# Patient Record
Sex: Female | Born: 1960
Health system: Southern US, Community
[De-identification: ages and names within clinical notes are randomized; demographics above are authoritative.]

## PROBLEM LIST (undated history)

## (undated) DIAGNOSIS — F329 Major depressive disorder, single episode, unspecified: Secondary | ICD-10-CM

## (undated) DIAGNOSIS — E042 Nontoxic multinodular goiter: Secondary | ICD-10-CM

## (undated) DIAGNOSIS — I10 Essential (primary) hypertension: Secondary | ICD-10-CM

## (undated) DIAGNOSIS — D509 Iron deficiency anemia, unspecified: Secondary | ICD-10-CM

## (undated) DIAGNOSIS — M1990 Unspecified osteoarthritis, unspecified site: Secondary | ICD-10-CM

## (undated) DIAGNOSIS — IMO0002 Reserved for concepts with insufficient information to code with codable children: Secondary | ICD-10-CM

## (undated) DIAGNOSIS — F411 Generalized anxiety disorder: Secondary | ICD-10-CM

## (undated) DIAGNOSIS — E119 Type 2 diabetes mellitus without complications: Secondary | ICD-10-CM

## (undated) DIAGNOSIS — R609 Edema, unspecified: Secondary | ICD-10-CM

## (undated) DIAGNOSIS — K219 Gastro-esophageal reflux disease without esophagitis: Secondary | ICD-10-CM

## (undated) HISTORY — DX: Morbid (severe) obesity due to excess calories: E66.01

## (undated) HISTORY — DX: Generalized anxiety disorder: F41.1

## (undated) HISTORY — DX: Gastro-esophageal reflux disease without esophagitis: K21.9

## (undated) HISTORY — DX: Nontoxic multinodular goiter: E04.2

## (undated) HISTORY — DX: Essential (primary) hypertension: I10

## (undated) HISTORY — DX: Type 2 diabetes mellitus without complications: E11.9

## (undated) HISTORY — DX: Edema, unspecified: R60.9

## (undated) HISTORY — DX: Unspecified osteoarthritis, unspecified site: M19.90

## (undated) HISTORY — DX: Reserved for concepts with insufficient information to code with codable children: IMO0002

## (undated) HISTORY — DX: Major depressive disorder, single episode, unspecified: F32.9

## (undated) HISTORY — DX: Iron deficiency anemia, unspecified: D50.9

---

## 1987-02-27 HISTORY — PX: INCISION AND DRAINAGE PERIRECTAL ABSCESS: SHX1804

## 1990-02-26 HISTORY — PX: CHOLECYSTECTOMY: SHX55

## 1991-02-27 HISTORY — PX: TREATMENT FISTULA ANAL: SUR1390

## 1997-06-15 ENCOUNTER — Encounter: Admission: RE | Admit: 1997-06-15 | Discharge: 1997-09-13 | Payer: Self-pay | Admitting: Internal Medicine

## 2000-09-10 ENCOUNTER — Encounter: Payer: Self-pay | Admitting: Internal Medicine

## 2000-09-10 ENCOUNTER — Encounter: Admission: RE | Admit: 2000-09-10 | Discharge: 2000-09-10 | Payer: Self-pay | Admitting: Internal Medicine

## 2000-09-10 ENCOUNTER — Encounter (INDEPENDENT_AMBULATORY_CARE_PROVIDER_SITE_OTHER): Payer: Self-pay | Admitting: *Deleted

## 2000-09-10 ENCOUNTER — Inpatient Hospital Stay (HOSPITAL_COMMUNITY): Admission: EM | Admit: 2000-09-10 | Discharge: 2000-09-13 | Payer: Self-pay | Admitting: Internal Medicine

## 2000-09-12 ENCOUNTER — Encounter: Payer: Self-pay | Admitting: Surgery

## 2002-01-01 ENCOUNTER — Encounter: Payer: Self-pay | Admitting: Surgery

## 2002-01-02 ENCOUNTER — Ambulatory Visit (HOSPITAL_COMMUNITY): Admission: RE | Admit: 2002-01-02 | Discharge: 2002-01-02 | Payer: Self-pay | Admitting: Surgery

## 2004-11-06 ENCOUNTER — Ambulatory Visit: Payer: Self-pay | Admitting: Endocrinology

## 2004-11-27 ENCOUNTER — Ambulatory Visit: Payer: Self-pay | Admitting: Internal Medicine

## 2004-12-04 ENCOUNTER — Ambulatory Visit: Payer: Self-pay | Admitting: Endocrinology

## 2004-12-18 ENCOUNTER — Ambulatory Visit: Payer: Self-pay | Admitting: Endocrinology

## 2005-01-22 ENCOUNTER — Ambulatory Visit: Payer: Self-pay | Admitting: Endocrinology

## 2005-01-31 LAB — HM COLONOSCOPY

## 2005-02-28 ENCOUNTER — Ambulatory Visit: Payer: Self-pay | Admitting: Endocrinology

## 2005-03-05 ENCOUNTER — Ambulatory Visit: Payer: Self-pay | Admitting: Endocrinology

## 2005-05-24 ENCOUNTER — Ambulatory Visit: Payer: Self-pay | Admitting: Endocrinology

## 2005-06-12 ENCOUNTER — Encounter: Payer: Self-pay | Admitting: Cardiology

## 2005-06-12 ENCOUNTER — Ambulatory Visit: Payer: Self-pay

## 2005-06-20 ENCOUNTER — Ambulatory Visit: Payer: Self-pay | Admitting: Endocrinology

## 2005-08-22 ENCOUNTER — Ambulatory Visit: Payer: Self-pay | Admitting: Endocrinology

## 2005-08-22 HISTORY — PX: ELECTROCARDIOGRAM: SHX264

## 2005-10-09 ENCOUNTER — Ambulatory Visit: Payer: Self-pay | Admitting: Endocrinology

## 2005-11-22 ENCOUNTER — Ambulatory Visit: Payer: Self-pay | Admitting: Endocrinology

## 2006-02-22 ENCOUNTER — Ambulatory Visit: Payer: Self-pay | Admitting: Endocrinology

## 2006-02-26 HISTORY — PX: GASTRIC BYPASS OPEN: SUR638

## 2006-05-27 ENCOUNTER — Ambulatory Visit: Payer: Self-pay | Admitting: Endocrinology

## 2006-05-27 LAB — CONVERTED CEMR LAB
Basophils Absolute: 0 10*3/uL (ref 0.0–0.1)
Basophils Relative: 0.3 % (ref 0.0–1.0)
HCT: 33.8 % — ABNORMAL LOW (ref 36.0–46.0)
Hgb A1c MFr Bld: 7.3 % — ABNORMAL HIGH (ref 4.6–6.0)
MCV: 83.6 fL (ref 78.0–100.0)
Monocytes Absolute: 0.4 10*3/uL (ref 0.2–0.7)
Monocytes Relative: 5.1 % (ref 3.0–11.0)
Neutrophils Relative %: 62.9 % (ref 43.0–77.0)
RDW: 16.2 % — ABNORMAL HIGH (ref 11.5–14.6)
WBC: 8.3 10*3/uL (ref 4.5–10.5)

## 2006-06-23 ENCOUNTER — Emergency Department (HOSPITAL_COMMUNITY): Admission: EM | Admit: 2006-06-23 | Discharge: 2006-06-23 | Payer: Self-pay | Admitting: Emergency Medicine

## 2006-06-24 ENCOUNTER — Emergency Department (HOSPITAL_COMMUNITY): Admission: EM | Admit: 2006-06-24 | Discharge: 2006-06-24 | Payer: Self-pay | Admitting: Emergency Medicine

## 2006-08-05 ENCOUNTER — Ambulatory Visit: Payer: Self-pay | Admitting: Endocrinology

## 2006-08-05 LAB — CONVERTED CEMR LAB
Alkaline Phosphatase: 90 units/L (ref 39–117)
Basophils Absolute: 0.1 10*3/uL (ref 0.0–0.1)
Bilirubin, Direct: 0.1 mg/dL (ref 0.0–0.3)
CO2: 27 meq/L (ref 19–32)
Calcium, Total (PTH): 9 mg/dL (ref 8.4–10.5)
Chloride: 109 meq/L (ref 96–112)
Creatinine, Ser: 0.8 mg/dL (ref 0.4–1.2)
Eosinophils Absolute: 0 10*3/uL (ref 0.0–0.6)
Eosinophils Relative: 0.6 % (ref 0.0–5.0)
Ferritin: 182.3 ng/mL (ref 10.0–291.0)
Folate: >20 ng/mL
GFR calc non Af Amer: 83 mL/min
Glucose, Bld: 215 mg/dL — ABNORMAL HIGH (ref 70–99)
HCT: 31.4 % — ABNORMAL LOW (ref 36.0–46.0)
Hemoglobin: 10.4 g/dL — ABNORMAL LOW (ref 12.0–15.0)
Hgb A1c MFr Bld: 6.9 % — ABNORMAL HIGH (ref 4.6–6.0)
MCHC: 33.2 g/dL (ref 30.0–36.0)
MCV: 84.2 fL (ref 78.0–100.0)
Microalb Creat Ratio: 1.2 mg/g (ref 0.0–30.0)
Monocytes Absolute: 0.3 10*3/uL (ref 0.2–0.7)
Neutro Abs: 3.9 10*3/uL (ref 1.4–7.7)
PTH: 22.1 pg/mL (ref 14.0–72.0)
Potassium: 3.5 meq/L (ref 3.5–5.1)
RBC: 3.73 M/uL — ABNORMAL LOW (ref 3.87–5.11)
Sodium: 141 meq/L (ref 135–145)
Total Bilirubin: 0.5 mg/dL (ref 0.3–1.2)
Total Protein: 7.1 g/dL (ref 6.0–8.3)
WBC: 6.7 10*3/uL (ref 4.5–10.5)

## 2006-09-21 ENCOUNTER — Encounter: Payer: Self-pay | Admitting: Endocrinology

## 2006-09-21 DIAGNOSIS — I10 Essential (primary) hypertension: Secondary | ICD-10-CM | POA: Insufficient documentation

## 2006-09-21 HISTORY — DX: Essential (primary) hypertension: I10

## 2006-12-04 ENCOUNTER — Ambulatory Visit: Payer: Self-pay | Admitting: Endocrinology

## 2006-12-04 ENCOUNTER — Encounter: Payer: Self-pay | Admitting: Endocrinology

## 2006-12-13 ENCOUNTER — Encounter: Admission: RE | Admit: 2006-12-13 | Discharge: 2006-12-13 | Payer: Self-pay | Admitting: Endocrinology

## 2006-12-25 ENCOUNTER — Other Ambulatory Visit: Admission: RE | Admit: 2006-12-25 | Discharge: 2006-12-25 | Payer: Self-pay | Admitting: Interventional Radiology

## 2006-12-25 ENCOUNTER — Encounter: Admission: RE | Admit: 2006-12-25 | Discharge: 2006-12-25 | Payer: Self-pay | Admitting: Endocrinology

## 2006-12-25 ENCOUNTER — Encounter (INDEPENDENT_AMBULATORY_CARE_PROVIDER_SITE_OTHER): Payer: Self-pay | Admitting: Interventional Radiology

## 2007-01-05 ENCOUNTER — Emergency Department (HOSPITAL_COMMUNITY): Admission: EM | Admit: 2007-01-05 | Discharge: 2007-01-05 | Payer: Self-pay | Admitting: Emergency Medicine

## 2007-01-05 ENCOUNTER — Telehealth: Payer: Self-pay | Admitting: Internal Medicine

## 2007-01-07 ENCOUNTER — Ambulatory Visit: Payer: Self-pay | Admitting: Endocrinology

## 2007-01-07 ENCOUNTER — Telehealth: Payer: Self-pay | Admitting: Internal Medicine

## 2007-01-07 ENCOUNTER — Telehealth: Payer: Self-pay | Admitting: Endocrinology

## 2007-01-07 DIAGNOSIS — IMO0002 Reserved for concepts with insufficient information to code with codable children: Secondary | ICD-10-CM

## 2007-01-07 DIAGNOSIS — E042 Nontoxic multinodular goiter: Secondary | ICD-10-CM

## 2007-01-07 HISTORY — DX: Reserved for concepts with insufficient information to code with codable children: IMO0002

## 2007-01-07 HISTORY — DX: Nontoxic multinodular goiter: E04.2

## 2007-01-13 ENCOUNTER — Telehealth (INDEPENDENT_AMBULATORY_CARE_PROVIDER_SITE_OTHER): Payer: Self-pay | Admitting: *Deleted

## 2007-01-13 DIAGNOSIS — IMO0002 Reserved for concepts with insufficient information to code with codable children: Secondary | ICD-10-CM

## 2007-01-13 HISTORY — DX: Reserved for concepts with insufficient information to code with codable children: IMO0002

## 2007-01-13 LAB — CONVERTED CEMR LAB: BUN: 14 mg/dL (ref 6–23)

## 2007-01-16 ENCOUNTER — Encounter: Admission: RE | Admit: 2007-01-16 | Discharge: 2007-01-16 | Payer: Self-pay | Admitting: Endocrinology

## 2007-01-27 ENCOUNTER — Telehealth (INDEPENDENT_AMBULATORY_CARE_PROVIDER_SITE_OTHER): Payer: Self-pay | Admitting: *Deleted

## 2007-02-17 ENCOUNTER — Ambulatory Visit: Payer: Self-pay | Admitting: Internal Medicine

## 2007-02-17 DIAGNOSIS — D509 Iron deficiency anemia, unspecified: Secondary | ICD-10-CM | POA: Insufficient documentation

## 2007-02-17 DIAGNOSIS — F411 Generalized anxiety disorder: Secondary | ICD-10-CM | POA: Insufficient documentation

## 2007-02-17 DIAGNOSIS — F329 Major depressive disorder, single episode, unspecified: Secondary | ICD-10-CM

## 2007-02-17 DIAGNOSIS — F3289 Other specified depressive episodes: Secondary | ICD-10-CM

## 2007-02-17 DIAGNOSIS — R066 Hiccough: Secondary | ICD-10-CM | POA: Insufficient documentation

## 2007-02-17 DIAGNOSIS — J069 Acute upper respiratory infection, unspecified: Secondary | ICD-10-CM | POA: Insufficient documentation

## 2007-02-17 HISTORY — DX: Major depressive disorder, single episode, unspecified: F32.9

## 2007-02-17 HISTORY — DX: Other specified depressive episodes: F32.89

## 2007-02-17 HISTORY — DX: Generalized anxiety disorder: F41.1

## 2007-02-17 HISTORY — DX: Iron deficiency anemia, unspecified: D50.9

## 2007-03-05 ENCOUNTER — Ambulatory Visit: Payer: Self-pay | Admitting: Endocrinology

## 2007-03-06 LAB — CONVERTED CEMR LAB
Basophils Relative: 0.5 % (ref 0.0–1.0)
Eosinophils Relative: 0.6 % (ref 0.0–5.0)
Hgb A1c MFr Bld: 7.2 % — ABNORMAL HIGH (ref 4.6–6.0)
Iron: 48 ug/dL (ref 42–145)
Lymphocytes Relative: 39.4 % (ref 12.0–46.0)
MCV: 85.5 fL (ref 78.0–100.0)
Monocytes Absolute: 0.5 10*3/uL (ref 0.2–0.7)
Neutrophils Relative %: 52.9 % (ref 43.0–77.0)
Platelets: 298 10*3/uL (ref 150–400)
RBC: 4.36 M/uL (ref 3.87–5.11)

## 2007-03-31 ENCOUNTER — Encounter: Payer: Self-pay | Admitting: Endocrinology

## 2007-04-15 ENCOUNTER — Ambulatory Visit: Payer: Self-pay | Admitting: Endocrinology

## 2007-05-07 ENCOUNTER — Ambulatory Visit: Payer: Self-pay | Admitting: Endocrinology

## 2007-06-16 ENCOUNTER — Ambulatory Visit: Payer: Self-pay | Admitting: Endocrinology

## 2007-07-17 ENCOUNTER — Encounter: Payer: Self-pay | Admitting: Endocrinology

## 2007-07-17 ENCOUNTER — Ambulatory Visit: Payer: Self-pay | Admitting: Endocrinology

## 2007-07-17 DIAGNOSIS — E119 Type 2 diabetes mellitus without complications: Secondary | ICD-10-CM

## 2007-07-17 HISTORY — DX: Type 2 diabetes mellitus without complications: E11.9

## 2007-09-11 ENCOUNTER — Encounter: Payer: Self-pay | Admitting: Endocrinology

## 2007-10-21 ENCOUNTER — Ambulatory Visit: Payer: Self-pay | Admitting: Endocrinology

## 2008-01-14 ENCOUNTER — Ambulatory Visit: Payer: Self-pay | Admitting: Endocrinology

## 2008-01-14 LAB — CONVERTED CEMR LAB: Hgb A1c MFr Bld: 7.2 % — ABNORMAL HIGH (ref 4.6–6.0)

## 2008-04-13 IMAGING — CT CT L SPINE W/O CM
3 of 8 series · 11 of 33 positions shown, 13 images · IV contrast (agent unspecified)
Comparison: None.

CLINICAL DATA: Left L5 radiculopathy.  Left leg pain.
 LUMBAR SPINE CT WITHOUT CONTRAST:
TECHNIQUE: Multidetector CT imaging of the lumbar spine was performed.  Multiplanar CT image reconstructions were also generated.

[Series 200: sagittal · sagittal · 0.43mm/px · 5 of 40 slices shown, 6 images]
[im 14/40  bone]
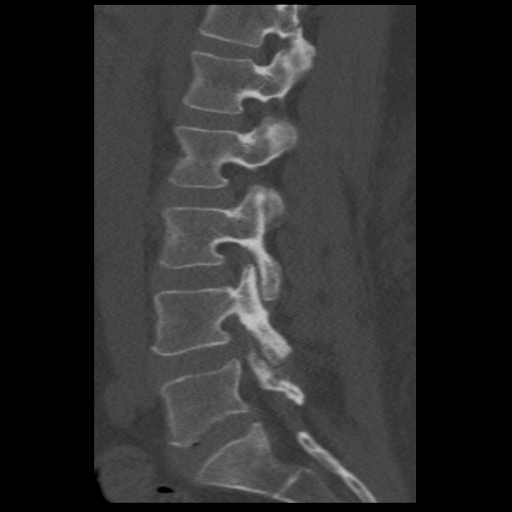
[im 17/40  bone]
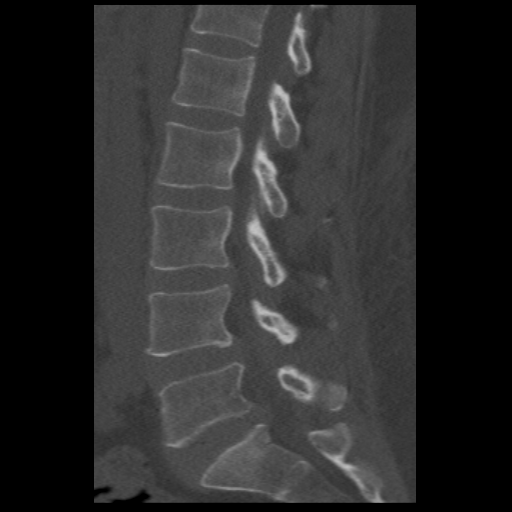
[im 20/40  soft-tissue]
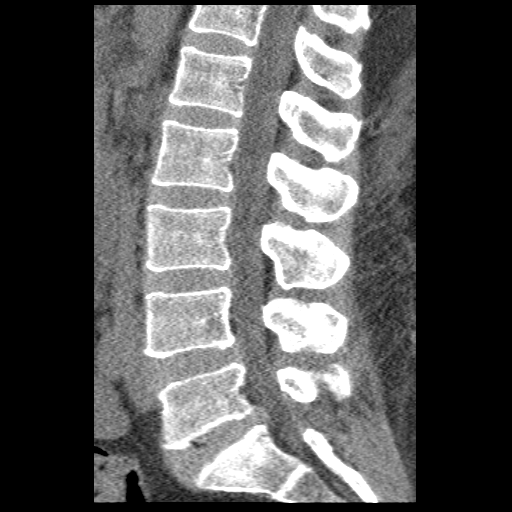
[im 20/40  bone]
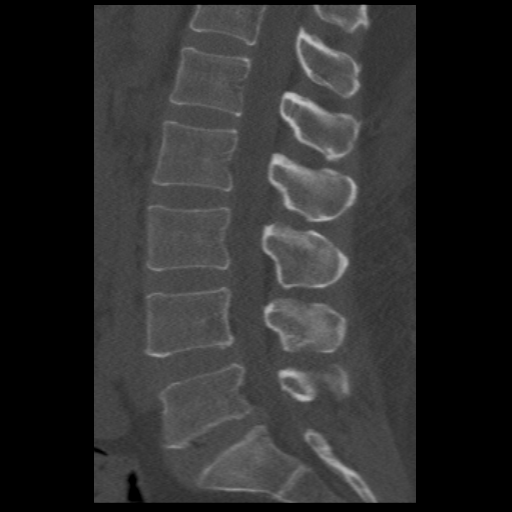
[im 23/40  bone]
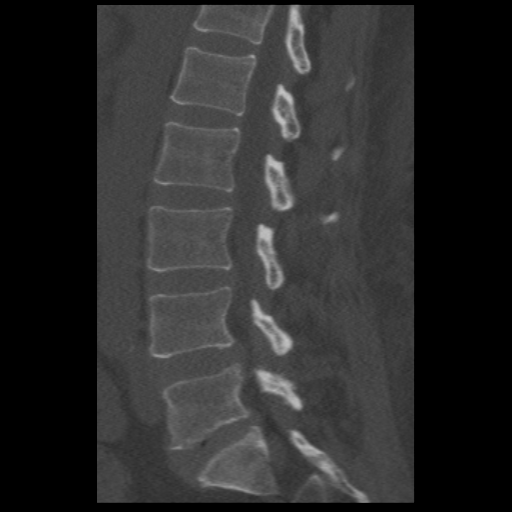
[im 27/40  bone]
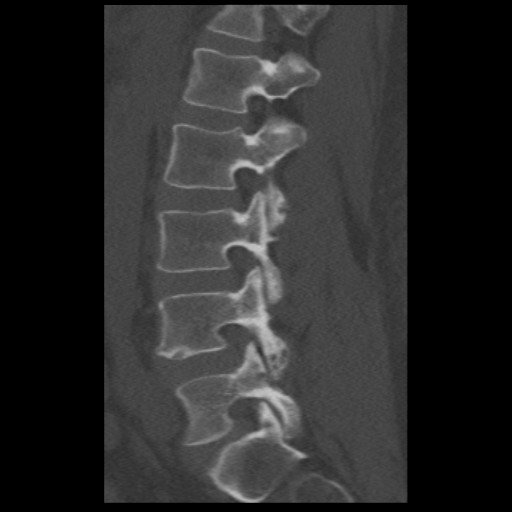

[Series 201: coronal · coronal · 0.43mm/px · 3 of 40 slices shown]
[im 8/40  bone]
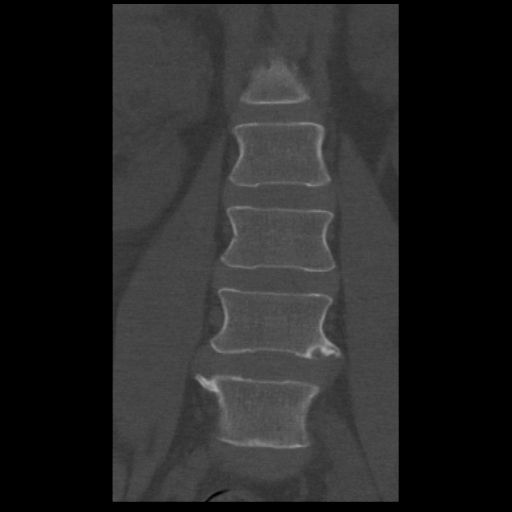
[im 16/40  bone]
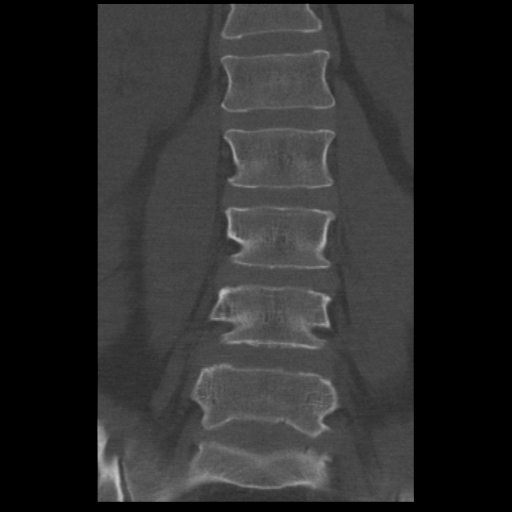
[im 24/40  bone]
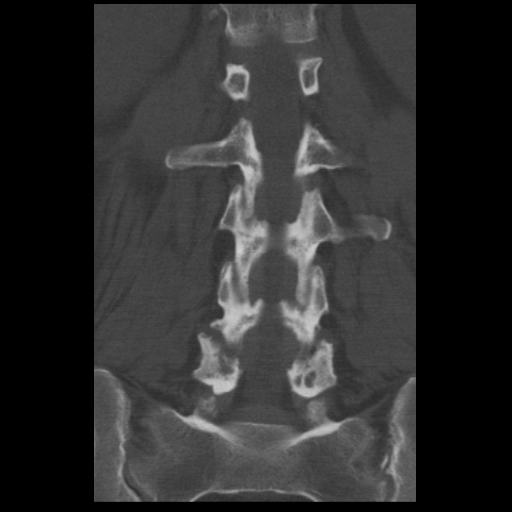

[Series 203: axial ref · axial · 0.23mm/px · z∈[+90,+151]mm · 3 of 12 slices shown, 4 images]
[im 1/12  soft-tissue]
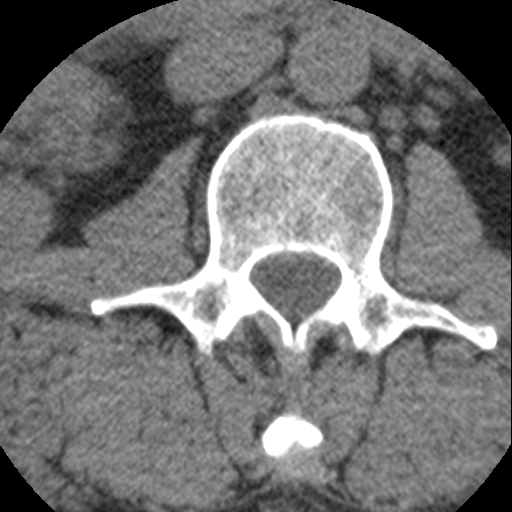
[im 1/12  bone]
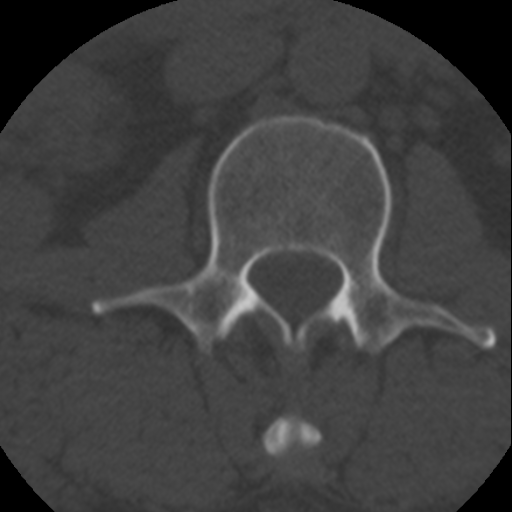
[im 6/12  bone]
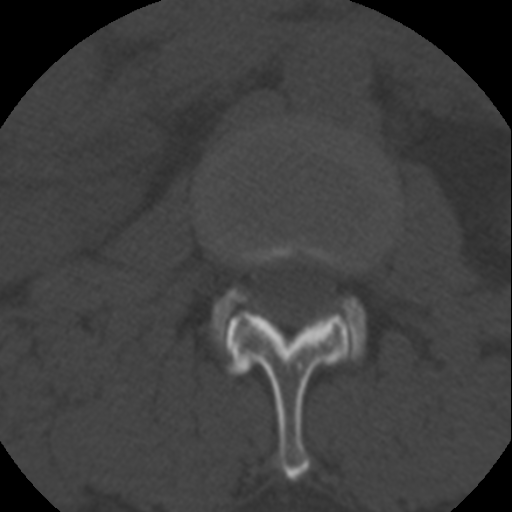
[im 12/12  bone]
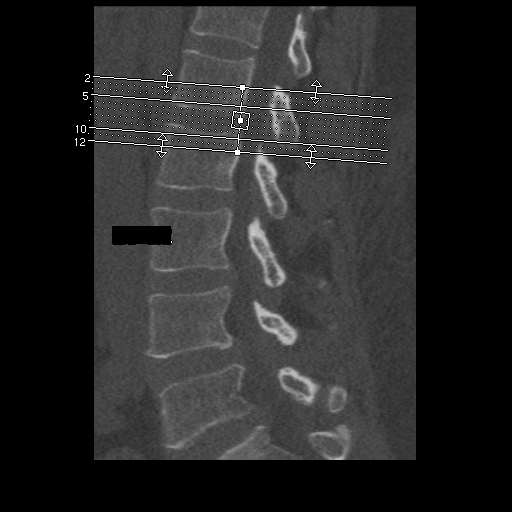

[11 of 33 positions shown; findings below may reference images not displayed]

FINDINGS: The lumbar alignment is normal.  No fracture is present.  No mass lesion is identified.
 L1-2:  Mild disk bulging and mild facet arthropathy. 
 L2-3:  Mild disk bulging.
 L3-4:  Mild disk bulging and mild facet arthropathy without significant spinal stenosis.
 L4-5:  Diffuse disk protrusion is present.  This is more prominent on the right than the left with disk protruding in the right foramen and lateral to the foramen causing impingement of the right L4 and L5 nerve roots.  There is also diffuse disk protrusion centrally and on the left side and there may be some milder impingement of the left L5 nerve root.  There is moderate to advanced facet arthropathy with ligamentum flavum hypertrophy.  There is moderate central canal stenosis.
 L5-S1:  Moderately large central disk protrusion slightly eccentric to the right, this is causing some mass effect on the thecal sac.  The S1 nerve roots appear to exit without impingement.  The L5 roots also appear to exit without significant impingement.
IMPRESSION: 1.  Degenerative disk disease at several levels, most prominent at L4-5.  There is diffuse disk protrusion at L4-5, more prominent on the right than the left.  There is bilateral facet arthropathy and moderate spinal stenosis.  There is impingement of the L5 nerve root bilaterally, right greater than left, and also some displacement of the right L4 nerve root lateral to the foramen due to disk protrusion. 
 2.  Moderately large central disk protrusion at L5-S1.

## 2008-04-21 ENCOUNTER — Ambulatory Visit: Payer: Self-pay | Admitting: Endocrinology

## 2008-04-21 LAB — CONVERTED CEMR LAB: Hgb A1c MFr Bld: 8 % — ABNORMAL HIGH (ref 4.6–6.0)

## 2008-04-26 ENCOUNTER — Telehealth (INDEPENDENT_AMBULATORY_CARE_PROVIDER_SITE_OTHER): Payer: Self-pay | Admitting: *Deleted

## 2008-07-21 ENCOUNTER — Ambulatory Visit: Payer: Self-pay | Admitting: Endocrinology

## 2008-07-21 DIAGNOSIS — R609 Edema, unspecified: Secondary | ICD-10-CM

## 2008-07-21 HISTORY — DX: Edema, unspecified: R60.9

## 2008-07-29 ENCOUNTER — Encounter: Payer: Self-pay | Admitting: Endocrinology

## 2008-08-26 LAB — CONVERTED CEMR LAB

## 2008-09-15 ENCOUNTER — Encounter: Payer: Self-pay | Admitting: Endocrinology

## 2008-09-20 ENCOUNTER — Encounter: Admission: RE | Admit: 2008-09-20 | Discharge: 2008-09-20 | Payer: Self-pay | Admitting: Family Medicine

## 2008-10-04 ENCOUNTER — Telehealth: Payer: Self-pay | Admitting: Endocrinology

## 2008-11-10 ENCOUNTER — Encounter: Payer: Self-pay | Admitting: Endocrinology

## 2008-11-10 ENCOUNTER — Other Ambulatory Visit: Admission: RE | Admit: 2008-11-10 | Discharge: 2008-11-10 | Payer: Self-pay | Admitting: Endocrinology

## 2008-11-10 ENCOUNTER — Ambulatory Visit: Payer: Self-pay | Admitting: Endocrinology

## 2008-11-11 LAB — CONVERTED CEMR LAB
Creatinine,U: 54.8 mg/dL
Microalb, Ur: 0.2 mg/dL (ref 0.0–1.9)

## 2008-11-26 ENCOUNTER — Telehealth: Payer: Self-pay | Admitting: Endocrinology

## 2009-02-09 ENCOUNTER — Ambulatory Visit: Payer: Self-pay | Admitting: Endocrinology

## 2009-02-09 LAB — CONVERTED CEMR LAB: Hgb A1c MFr Bld: 9.3 % — ABNORMAL HIGH (ref 4.6–6.5)

## 2009-03-11 ENCOUNTER — Encounter: Payer: Self-pay | Admitting: Endocrinology

## 2009-06-01 ENCOUNTER — Ambulatory Visit: Payer: Self-pay | Admitting: Endocrinology

## 2009-06-01 DIAGNOSIS — Z9884 Bariatric surgery status: Secondary | ICD-10-CM

## 2009-06-01 LAB — CONVERTED CEMR LAB
Calcium, Total (PTH): 9.3 mg/dL (ref 8.4–10.5)
Hgb A1c MFr Bld: 7.5 % — ABNORMAL HIGH (ref 4.6–6.5)
PTH: 28.9 pg/mL (ref 14.0–72.0)

## 2009-06-03 ENCOUNTER — Encounter: Payer: Self-pay | Admitting: Endocrinology

## 2009-08-05 ENCOUNTER — Encounter: Payer: Self-pay | Admitting: Endocrinology

## 2009-09-01 ENCOUNTER — Encounter: Admission: RE | Admit: 2009-09-01 | Discharge: 2009-09-01 | Payer: Self-pay | Admitting: Family Medicine

## 2009-09-05 ENCOUNTER — Ambulatory Visit: Payer: Self-pay | Admitting: Endocrinology

## 2009-09-05 LAB — CONVERTED CEMR LAB: TSH: 0.93 microintl units/mL (ref 0.35–5.50)

## 2009-09-16 ENCOUNTER — Encounter: Payer: Self-pay | Admitting: Endocrinology

## 2009-12-08 ENCOUNTER — Ambulatory Visit: Payer: Self-pay | Admitting: Endocrinology

## 2010-03-09 ENCOUNTER — Ambulatory Visit
Admission: RE | Admit: 2010-03-09 | Discharge: 2010-03-09 | Payer: Self-pay | Source: Home / Self Care | Attending: Endocrinology | Admitting: Endocrinology

## 2010-03-09 ENCOUNTER — Other Ambulatory Visit: Payer: Self-pay | Admitting: Endocrinology

## 2010-03-09 LAB — HEMOGLOBIN A1C: Hgb A1c MFr Bld: 8 % — ABNORMAL HIGH (ref 4.6–6.5)

## 2010-03-19 ENCOUNTER — Encounter: Payer: Self-pay | Admitting: Family Medicine

## 2010-03-19 ENCOUNTER — Encounter: Payer: Self-pay | Admitting: Endocrinology

## 2010-03-28 NOTE — Assessment & Plan Note (Signed)
Summary: 3 MTH FU--STC   Vital Signs:  Patient profile:   50 year old female Height:      67 inches (170.18 cm) Weight:      244 pounds (110.91 kg) BMI:     38.35 O2 Sat:      98 % on Room air Temp:     98.7 degrees F (37.06 degrees C) oral Pulse rate:   79 / minute BP sitting:   130 / 74  (left arm) Cuff size:   large  Vitals Entered By: Rebeca Alert MA (September 05, 2009 8:02 AM)  O2 Flow:  Room air CC: 3 mo f/u/aj   Primary Provider:  bouska  CC:  3 mo f/u/aj.  History of Present Illness: the status of at least 3 ongoing medical problems is addressed today: goiter:  pt says she does not notice it.   dm:  no cbg record, but states cbg's are well-controlled. she had bariatric surgery:  weight loss persists, due to her efforts.  Current Medications (verified): 1)  Depo-Provera 150 Mg/ml Im Susp (Medroxyprogesterone Acetate) .... Take 1 Shot Q 3 Months 2)  Iron 325 (65 Fe) Mg  Tabs (Ferrous Sulfate) .... Take 2 By Mouth Qd 3)  Multivitamins   Tabs (Multiple Vitamin) .... Take 1 By Mouth Two Times A Day 4)  Vitamin B-12 .... Once Daily 5)  Ultram Er 200 Mg  Tb24 (Tramadol Hcl) .... Take 1-2 Q 4 Hours Prn 6)  Ascensia Contour Test   Strp (Glucose Blood) .... Use 4 Times A Day 7)  Glucophage Xr 500 Mg Tb24 (Metformin Hcl) .... 4 Tablets Daily 8)  Percocet 5-325 Mg  Tabs (Oxycodone-Acetaminophen) .... Take 1-2 By Mouth Q 6 Hours Prn 9)  Oxycodone Hcl 30 Mg  Tabs (Oxycodone Hcl) .... One Tablet Every 3 Hours As Needed For Pain 10)  Zofran Odt 4 Mg  Tbdp (Ondansetron) .... Every 4 Hours As Needed For Nausea 11)  Thioridazine Hcl 25 Mg  Tabs (Thioridazine Hcl) .Marland Kitchen.. 1 By Mouth Bid 12)  Promethazine Hcl 25 Mg  Tabs (Promethazine Hcl) .Marland Kitchen.. 1 By Mouth Qid Prn 13)  Trazodone Hcl 100 Mg  Tabs (Trazodone Hcl) .... Take 2 By Mouth Qhs 14)  Diovan Hct 80-12.5 Mg Tabs (Valsartan-Hydrochlorothiazide) .... Qd 15)  Eq Omeprazole 20 Mg Tbec (Omeprazole) .Marland Kitchen.. 1 Tab Two Times A Day 16)  Zinc Tab  .Marland Kitchen.. 1 Tab Daily 17)  Januvia 100 Mg Tabs (Sitagliptin Phosphate) .Marland Kitchen.. 1 Qd 18)  Glimepiride 4 Mg Tabs (Glimepiride) .Marland Kitchen.. 1 Tab Two Times A Day 19)  Bromocriptine Mesylate 2.5 Mg Tabs (Bromocriptine Mesylate) .... 1/2 Tab Qhs  Allergies (verified): 1)  ! Sulfa  Past History:  Past Medical History: Last updated: 01/14/2008 Contraction Morbid Obesity DM (ICD-250.00) HICCUPS (ICD-786.8) URI (ICD-465.9) DEPRESSION (ICD-311) ANXIETY (ICD-300.00) ANEMIA-IRON DEFICIENCY (ICD-280.9) LUMBAR RADICULOPATHY (ICD-724.4) ENCOUNTER FOR LONG-TERM USE OF OTHER MEDICATIONS (ICD-V58.69) GOITER, NONTOXIC MULTINODULAR (ICD-241.1) UNSPECIFIED NEURALGIA NEURITIS AND RADICULITIS (ICD-729.2) HYPERTENSION (ICD-401.9)  Review of Systems       denies n/v/dysphagia  Physical Exam  General:  obese.  no distress  Neck:  large multinodular goiter Additional Exam:  thyroid US:  no change  Hemoglobin A1C       [H]  7.9 %                       4.6-6.5 FastTSH  0.93 uIU/mL      Impression & Recommendations:  Problem # 1:  DM (ICD-250.00) Assessment Unchanged  Problem # 2:  BARIATRIC SURGERY STATUS (ICD-V45.86) with good ongoing weight-loss  Problem # 3:  GOITER, NONTOXIC MULTINODULAR (ICD-241.1) Assessment: Unchanged  Other Orders: TLB-A1C / Hgb A1C (Glycohemoglobin) (83036-A1C) TLB-TSH (Thyroid Stimulating Hormone) (84443-TSH) Est. Patient Level IV (33744)  Patient Instructions: 1)  most of the time, a "lumpy thyroid" will eventually become overactive.  this is usually a slow process, happening over the span of many years. 2)  continue weight-loss efforts. 3)  blood tests are being ordered for you today.  please call [phone] to hear your test results. 4)  pending the test results, please continue the same medications for now 5)  Please schedule a follow-up appointment in 3 months. 6)  (update: i left message on phone-tree:  rx as we discussed)

## 2010-03-28 NOTE — Letter (Signed)
Summary: [facility]   Imported By: Bubba Hales 06/10/2009 11:03:49  _____________________________________________________________________  External Attachment:    Type:   Image     Comment:   External Document

## 2010-03-28 NOTE — Letter (Signed)
Summary: [facility]   Imported By: Phillis Knack 03/28/2009 14:43:52  _____________________________________________________________________  External Attachment:    Type:   Image     Comment:   External Document

## 2010-03-28 NOTE — Assessment & Plan Note (Signed)
Summary: PER PT 3 MTH FU---STC   Vital Signs:  Patient profile:   50 year old female Height:      67 inches (170.18 cm) Weight:      238 pounds (108.18 kg) BMI:     37.41 O2 Sat:      99 % on Room air Temp:     98.9 degrees F (37.17 degrees C) oral Pulse rate:   79 / minute BP sitting:   122 / 72  (left arm) Cuff size:   large  Vitals Entered By: Rebeca Alert MA (December 08, 2009 1:06 PM)  O2 Flow:  Room air CC: 3 month F/U/aj, Back Pain Is Patient Diabetic? Yes   Primary Provider:  bouska  CC:  3 month F/U/aj and Back Pain.  History of Present Illness: the status of at least 3 ongoing medical problems is addressed today: weight loss:  it has resumed, due to her renewed effortssince bariatric surgery, she has lost 256 lbs.   dm:  pt states she feels well in general.  no cbg record, but states cbg's are well-controlled.   htn:  she denies doe   Current Medications (verified): 1)  Depo-Provera 150 Mg/ml Im Susp (Medroxyprogesterone Acetate) .... Take 1 Shot Q 3 Months 2)  Iron 325 (65 Fe) Mg  Tabs (Ferrous Sulfate) .... Take 2 By Mouth Qd 3)  Multivitamins   Tabs (Multiple Vitamin) .... Take 1 By Mouth Two Times A Day 4)  Vitamin B-12 .... Once Daily 5)  Ultram Er 200 Mg  Tb24 (Tramadol Hcl) .... Take 1-2 Q 4 Hours Prn 6)  Ascensia Contour Test   Strp (Glucose Blood) .... Use 4 Times A Day 7)  Glucophage Xr 500 Mg Tb24 (Metformin Hcl) .... 4 Tablets Daily 8)  Percocet 5-325 Mg  Tabs (Oxycodone-Acetaminophen) .... Take 1-2 By Mouth Q 6 Hours Prn 9)  Oxycodone Hcl 30 Mg  Tabs (Oxycodone Hcl) .... One Tablet Every 3 Hours As Needed For Pain 10)  Zofran Odt 4 Mg  Tbdp (Ondansetron) .... Every 4 Hours As Needed For Nausea 11)  Thioridazine Hcl 25 Mg  Tabs (Thioridazine Hcl) .Marland Kitchen.. 1 By Mouth Bid 12)  Promethazine Hcl 25 Mg  Tabs (Promethazine Hcl) .Marland Kitchen.. 1 By Mouth Qid Prn 13)  Trazodone Hcl 100 Mg  Tabs (Trazodone Hcl) .... Take 2 By Mouth Qhs 14)  Diovan Hct 80-12.5 Mg Tabs  (Valsartan-Hydrochlorothiazide) .... Qd 15)  Eq Omeprazole 20 Mg Tbec (Omeprazole) .Marland Kitchen.. 1 Tab Two Times A Day 16)  Zinc Tab .Marland Kitchen.. 1 Tab Daily 17)  Januvia 100 Mg Tabs (Sitagliptin Phosphate) .Marland Kitchen.. 1 Qd 18)  Bromocriptine Mesylate 2.5 Mg Tabs (Bromocriptine Mesylate) .... 1/2 Tab Qhs  Allergies (verified): 1)  ! Sulfa  Past History:  Past Medical History: Last updated: 01/14/2008 Contraction Morbid Obesity DM (ICD-250.00) HICCUPS (ICD-786.8) URI (ICD-465.9) DEPRESSION (ICD-311) ANXIETY (ICD-300.00) ANEMIA-IRON DEFICIENCY (ICD-280.9) LUMBAR RADICULOPATHY (ICD-724.4) ENCOUNTER FOR LONG-TERM USE OF OTHER MEDICATIONS (ICD-V58.69) GOITER, NONTOXIC MULTINODULAR (ICD-241.1) UNSPECIFIED NEURALGIA NEURITIS AND RADICULITIS (ICD-729.2) HYPERTENSION (ICD-401.9)  Review of Systems  The patient denies chest pain and abdominal pain.         denies n/v.  Physical Exam  General:  obese.  no distress  Pulses:  dorsalis pedis intact bilat.   Extremities:  no deformity.  no ulcer on the feet.  feet are of normal color and temp.  no edema  Neurologic:  sensation is intact to touch on the feet  Additional Exam:   Hemoglobin A1C       [  H]  7.0 %    Impression & Recommendations:  Problem # 1:  DM (ICD-250.00) well-controlled  Problem # 2:  HYPERTENSION (ICD-401.9) well-controlled  Problem # 3:  BARIATRIC SURGERY STATUS (ICD-V45.86) weight-loss has resumed  Medications Added to Medication List This Visit: 1)  Diovan Hct 80-12.5 Mg Tabs (Valsartan-hydrochlorothiazide) .... 1/2 tab once daily 2)  Bromocriptine Mesylate 2.5 Mg Tabs (Bromocriptine mesylate) .Marland Kitchen.. 1 tab qhs  Other Orders: TLB-A1C / Hgb A1C (Glycohemoglobin) (83036-A1C) Est. Patient Level IV (21194)  Patient Instructions: 1)  continue weight-loss efforts. 2)  blood tests are being ordered for you today.  please call [phone] to hear your test results. 3)  pending the test results, please continue the same medications  for now. 4)  reduce diovan-hct to 1/2 pill daily. 5)  Please schedule a follow-up appointment in 3 months. 6)  (update: i left message on phone-tree:  stop [place].  increase parlodel to 2.5 mg qhs). Prescriptions: BROMOCRIPTINE MESYLATE 2.5 MG TABS (BROMOCRIPTINE MESYLATE) 1 tab qhs  #30 x 11   Entered and Authorized by:   Donavan Foil MD   Signed by:   Donavan Foil MD on 12/08/2009   Method used:   Electronically to        [facility]. [phone]* (retail)       [place], [place]  [address]       Ph: [phone]       Fax: [phone]   RxID:   [id number] DIOVAN HCT 80-12.5 MG TABS (VALSARTAN-HYDROCHLOROTHIAZIDE) 1/2 tab once daily  #30 x 5   Entered and Authorized by:   Donavan Foil MD   Signed by:   Donavan Foil MD on 12/08/2009   Method used:   Electronically to        [facility]. [phone]* (retail)       [address] [place], [place]  [address]       Ph: [phone]       Fax: [phone]   RxID:   [id number]

## 2010-03-28 NOTE — Letter (Signed)
Summary: [facility]   Imported By: Phillis Knack 08/16/2009 10:51:41  _____________________________________________________________________  External Attachment:    Type:   Image     Comment:   External Document

## 2010-03-28 NOTE — Assessment & Plan Note (Signed)
Summary: 4 mos f/u  // cd   Vital Signs:  Patient profile:   50 year old female Height:      67 inches (170.18 cm) Weight:      257.25 pounds (116.93 kg) O2 Sat:      98 % on Room air Temp:     98.4 degrees F (36.89 degrees C) oral Pulse rate:   80 / minute BP sitting:   152 / 82  (left arm) Cuff size:   large  Vitals Entered By: Gardenia Phlegm RMA (June 01, 2009 10:54 AM)  O2 Flow:  Room air CC: 4 month follow up/ pt states she is no longer taking Ultram, Zofran, Thioridazine, or Promethazine/ CF   Primary Provider:  bouska  CC:  4 month follow up/ pt states she is no longer taking Ultram, Zofran, Thioridazine, and or Promethazine/ CF.  History of Present Illness: the status of at least 3 ongoing medical problems is addressed today: pt has lost 240 lbs since her gastric bypass surgery in 2008, but there has been no change in the past 4 months.   pt states few mos of slight muscle cramps of the legs, but no associated numbness. no cbg record, but states cbg's are 200's.  Current Medications (verified): 1)  Depo-Provera 150 Mg/ml Im Susp (Medroxyprogesterone Acetate) .... Take 1 Shot Q 3 Months 2)  Iron 325 (65 Fe) Mg  Tabs (Ferrous Sulfate) .... Take 2 By Mouth Qd 3)  Multivitamins   Tabs (Multiple Vitamin) .... Take 1 By Mouth Two Times A Day 4)  Vitamin B-12 .... Once Daily 5)  Ultram Er 200 Mg  Tb24 (Tramadol Hcl) .... Take 1-2 Q 4 Hours Prn 6)  Ascensia Contour Test   Strp (Glucose Blood) .... Use 4 Times A Day 7)  Glucophage Xr 500 Mg Tb24 (Metformin Hcl) .... 4 Tablets Daily 8)  Percocet 5-325 Mg  Tabs (Oxycodone-Acetaminophen) .... Take 1-2 By Mouth Q 6 Hours Prn 9)  Oxycodone Hcl 30 Mg  Tabs (Oxycodone Hcl) .... One Tablet Every 3 Hours As Needed For Pain 10)  Zofran Odt 4 Mg  Tbdp (Ondansetron) .... Every 4 Hours As Needed For Nausea 11)  Thioridazine Hcl 25 Mg  Tabs (Thioridazine Hcl) .Marland Kitchen.. 1 By Mouth Bid 12)  Promethazine Hcl 25 Mg  Tabs (Promethazine Hcl) .Marland Kitchen.. 1  By Mouth Qid Prn 13)  Trazodone Hcl 100 Mg  Tabs (Trazodone Hcl) .... Take 2 By Mouth Qhs 14)  Diovan Hct 80-12.5 Mg Tabs (Valsartan-Hydrochlorothiazide) .... Qd 15)  Eq Omeprazole 20 Mg Tbec (Omeprazole) .Marland Kitchen.. 1 Tab Two Times A Day 16)  Zinc Tab .Marland Kitchen.. 1 Tab Daily 17)  Januvia 100 Mg Tabs (Sitagliptin Phosphate) .Marland Kitchen.. 1 Qd  Allergies (verified): 1)  ! Sulfa  Past History:  Past Medical History: Last updated: 01/14/2008 Contraction Morbid Obesity DM (ICD-250.00) HICCUPS (ICD-786.8) URI (ICD-465.9) DEPRESSION (ICD-311) ANXIETY (ICD-300.00) ANEMIA-IRON DEFICIENCY (ICD-280.9) LUMBAR RADICULOPATHY (ICD-724.4) ENCOUNTER FOR LONG-TERM USE OF OTHER MEDICATIONS (ICD-V58.69) GOITER, NONTOXIC MULTINODULAR (ICD-241.1) UNSPECIFIED NEURALGIA NEURITIS AND RADICULITIS (ICD-729.2) HYPERTENSION (ICD-401.9)  Review of Systems  The patient denies peripheral edema and dyspnea on exertion.    Physical Exam  General:  obese.  no distress  Pulses:  dorsalis pedis intact bilat.   Extremities:  no deformity.  no ulcer on the feet.  feet are of normal color and temp.  no edema  Neurologic:  sensation is intact to touch on the feet  Additional Exam:  Hemoglobin A1C       [  H]  7.5 %  Parathyroid Hormone       28.9 pg/mL                  14.0-72.0 Calcium                   9.3 mg/dL                   8.4-10.5     Impression & Recommendations:  Problem # 1:  BARIATRIC SURGERY STATUS (ICD-V45.86) Assessment Unchanged  Problem # 2:  DM (ICD-250.00) a1c not as high as suggested by cbg's  Problem # 3:  leg cramps not related to #1  Medications Added to Medication List This Visit: 1)  Multivitamins Tabs (Multiple vitamin) .... Take 1 by mouth two times a day 2)  Vitamin B-12  .... Once daily 3)  Glimepiride 4 Mg Tabs (Glimepiride) .Marland Kitchen.. 1 tab two times a day 4)  Bromocriptine Mesylate 2.5 Mg Tabs (Bromocriptine mesylate) .... 1/2 tab qhs  Other Orders: T-Parathyroid Hormone, Intact w/  Calcium (49201-00712) TLB-A1C / Hgb A1C (Glycohemoglobin) (83036-A1C) Est. Patient Level IV (19758)  Patient Instructions: 1)  tests are being ordered for you today.  a few days after the test(s), please call [phone] to hear your test results.   2)  pending the test results, add glimepiride 4 mg two times a day, and bromocriptine 1/2 of 2.5 mg at bedtime. 3)  return 3 months. 4)  please sign the release of information for the thyroid ultrasound done last year. 5)  (update: i left message on phone-tree:  add only the bromocriptine.  for now, do not take the glimepiride). Prescriptions: TRAZODONE HCL 100 MG  TABS (TRAZODONE HCL) TAKE 2 by mouth QHS  #180 x 3   Entered and Authorized by:   Donavan Foil MD   Signed by:   Donavan Foil MD on 06/01/2009   Method used:   Electronically to        [facility] [phone]* (retail)       [address]       [place], [place]  [address]       Ph: [phone]       Fax: [phone]   RxID:   [id number] DIOVAN HCT 80-12.5 MG TABS (VALSARTAN-HYDROCHLOROTHIAZIDE) qd  #30 x 11   Entered and Authorized by:   Donavan Foil MD   Signed by:   Donavan Foil MD on 06/01/2009   Method used:   Electronically to        [facility] [phone]* (retail)       [place], [place]  [address]       Ph: [phone]       Fax: [phone]   RxID:   [id number] GLUCOPHAGE XR 500 MG TB24 (METFORMIN HCL) 4 tablets daily  #360 x 3   Entered and Authorized by:   Donavan Foil MD   Signed by:   Donavan Foil MD on 06/01/2009   Method used:   Electronically to        [facility] [phone]* (retail)       [place], [place]  [address]       Ph: [phone]       Fax: [phone]   RxID:   [id number] JANUVIA 100 MG TABS (SITAGLIPTIN PHOSPHATE) 1 qd  #30 x  11   Entered and Authorized by:   Donavan Foil MD   Signed by:   Donavan Foil MD on 06/01/2009   Method used:   Electronically to         [facility] [phone]* (retail)       [place], [place]  [address]       Ph: [phone]       Fax: [phone]   RxID:   [id number] GLIMEPIRIDE 4 MG TABS (GLIMEPIRIDE) 1 tab two times a day  #180 x 3   Entered and Authorized by:   Donavan Foil MD   Signed by:   Donavan Foil MD on 06/01/2009   Method used:   Electronically to        [facility] [phone]* (retail)       [place], [place]  [address]       Ph: [phone]       Fax: [phone]   RxID:   [id number] BROMOCRIPTINE MESYLATE 2.5 MG TABS (BROMOCRIPTINE MESYLATE) 1/2 tab qhs  #45 x 3   Entered and Authorized by:   Donavan Foil MD   Signed by:   Donavan Foil MD on 06/01/2009   Method used:   Electronically to        [facility] [phone]* (retail)       [address]       [place], [place]  [address]       Ph: [phone]       Fax: [phone]   RxID:   [id number]

## 2010-03-30 NOTE — Assessment & Plan Note (Signed)
Summary: 3 mos f/u #/cd   Vital Signs:  Patient profile:   50 year old female Height:      67 inches (170.18 cm) Weight:      220 pounds (100.00 kg) BMI:     34.58 O2 Sat:      97 % on Room air Temp:     98.7 degrees F (37.06 degrees C) oral Pulse rate:   91 / minute Pulse rhythm:   regular BP sitting:   120 / 62  (left arm) Cuff size:   large  Vitals Entered By: Rebeca Alert CMA Deborra Medina) (March 09, 2010 2:14 PM)  O2 Flow:  Room air CC: 3 month F/U/aj Is Patient Diabetic? Yes   Primary Provider:  bouska  CC:  3 month F/U/aj.  History of Present Illness: pt continues to lose weight, since her bariatric surgery, in 2008.  she has lost a total of approx 270 lbs.  no cbg record, but states cbg's are well-controlled.  Current Medications (verified): 1)  Depo-Provera 150 Mg/ml Im Susp (Medroxyprogesterone Acetate) .... Take 1 Shot Q 3 Months 2)  Iron 325 (65 Fe) Mg  Tabs (Ferrous Sulfate) .... Take 2 By Mouth Qd 3)  Multivitamins   Tabs (Multiple Vitamin) .... Take 1 By Mouth Two Times A Day 4)  Vitamin B-12 .... Once Daily 5)  Ultram Er 200 Mg  Tb24 (Tramadol Hcl) .... Take 1-2 Q 4 Hours Prn 6)  Ascensia Contour Test   Strp (Glucose Blood) .... Use 4 Times A Day 7)  Glucophage Xr 500 Mg Tb24 (Metformin Hcl) .... 4 Tablets Daily 8)  Percocet 5-325 Mg  Tabs (Oxycodone-Acetaminophen) .... Take 1-2 By Mouth Q 6 Hours Prn 9)  Oxycodone Hcl 30 Mg  Tabs (Oxycodone Hcl) .... One Tablet Every 3 Hours As Needed For Pain 10)  Zofran Odt 4 Mg  Tbdp (Ondansetron) .... Every 4 Hours As Needed For Nausea 11)  Thioridazine Hcl 25 Mg  Tabs (Thioridazine Hcl) .Marland Kitchen.. 1 By Mouth Bid 12)  Promethazine Hcl 25 Mg  Tabs (Promethazine Hcl) .Marland Kitchen.. 1 By Mouth Qid Prn 13)  Trazodone Hcl 100 Mg  Tabs (Trazodone Hcl) .... Take 2 By Mouth Qhs 14)  Diovan Hct 80-12.5 Mg Tabs (Valsartan-Hydrochlorothiazide) .... 1/2 Tab Once Daily 15)  Eq Omeprazole 20 Mg Tbec (Omeprazole) .Marland Kitchen.. 1 Tab Two Times A Day 16)  Zinc  Tab .Marland Kitchen.. 1 Tab Daily 17)  Bromocriptine Mesylate 2.5 Mg Tabs (Bromocriptine Mesylate) .Marland Kitchen.. 1 Tab Qhs  Allergies (verified): 1)  ! Sulfa  Past History:  Past Medical History: Last updated: 01/14/2008 Contraction Morbid Obesity DM (ICD-250.00) HICCUPS (ICD-786.8) URI (ICD-465.9) DEPRESSION (ICD-311) ANXIETY (ICD-300.00) ANEMIA-IRON DEFICIENCY (ICD-280.9) LUMBAR RADICULOPATHY (ICD-724.4) ENCOUNTER FOR LONG-TERM USE OF OTHER MEDICATIONS (ICD-V58.69) GOITER, NONTOXIC MULTINODULAR (ICD-241.1) UNSPECIFIED NEURALGIA NEURITIS AND RADICULITIS (ICD-729.2) HYPERTENSION (ICD-401.9)  Review of Systems  The patient denies hypoglycemia.    Physical Exam  General:  obese.  no distress  Pulses:  dorsalis pedis intact bilat.   Extremities:  no deformity.  no ulcer on the feet.  feet are of normal color and temp.  no edema  Neurologic:  sensation is intact to touch on the feet  Additional Exam:   Hemoglobin A1C       [H]  8.0 %      Impression & Recommendations:  Problem # 1:  DM (ICD-250.00) needs increased rx  Medications Added to Medication List This Visit: 1)  Bromocriptine Mesylate 2.5 Mg Tabs (Bromocriptine mesylate) .Marland Kitchen.. 1 tab  two times a day 2)  Januvia 100 Mg Tabs (Sitagliptin phosphate) .Marland Kitchen.. 1 tab once daily  Other Orders: TLB-A1C / Hgb A1C (Glycohemoglobin) (83036-A1C) Est. Patient Level III (90301)  Patient Instructions: 1)  blood tests are being ordered for you today.  please call [phone] to hear your test results. 2)  pending the test results, please continue the same medications for now. 3)  Please schedule a follow-up appointment in 6 months. 4)  (update: i left message on phone-tree:  increase parlodel to 2.5 mg two times a day.  add januvia 100 mg qam). Prescriptions: JANUVIA 100 MG TABS (SITAGLIPTIN PHOSPHATE) 1 tab once daily  #30 x 11   Entered and Authorized by:   Donavan Foil MD   Signed by:   Donavan Foil MD on 03/09/2010   Method used:    Electronically to        [facility]. [phone]* (retail)       [place], [place]  [address]       Ph: [phone]       Fax: [phone]   RxID:   [id number] BROMOCRIPTINE MESYLATE 2.5 MG TABS (BROMOCRIPTINE MESYLATE) 1 tab two times a day  #60 x 11   Entered and Authorized by:   Donavan Foil MD   Signed by:   Donavan Foil MD on 03/09/2010   Method used:   Electronically to        [facility]. [phone]* (retail)       [address] [place], [place]  [address]       Ph: [phone]       Fax: [phone]   RxID:   [id number]    Orders Added: 1)  TLB-A1C / Hgb A1C (Glycohemoglobin) [83036-A1C] 2)  Est. Patient Level III [47395]

## 2010-05-08 ENCOUNTER — Ambulatory Visit: Payer: Self-pay | Admitting: Endocrinology

## 2010-05-11 ENCOUNTER — Ambulatory Visit (INDEPENDENT_AMBULATORY_CARE_PROVIDER_SITE_OTHER): Payer: Self-pay | Admitting: Endocrinology

## 2010-05-11 ENCOUNTER — Encounter: Payer: Self-pay | Admitting: Endocrinology

## 2010-05-11 DIAGNOSIS — E119 Type 2 diabetes mellitus without complications: Secondary | ICD-10-CM

## 2010-05-11 DIAGNOSIS — K219 Gastro-esophageal reflux disease without esophagitis: Secondary | ICD-10-CM | POA: Insufficient documentation

## 2010-05-11 HISTORY — DX: Gastro-esophageal reflux disease without esophagitis: K21.9

## 2010-05-25 NOTE — Assessment & Plan Note (Signed)
Summary: fu/lb   Vital Signs:  Patient profile:   50 year old female Height:      67 inches (170.18 cm) Weight:      211.38 pounds (96.08 kg) BMI:     33.23 O2 Sat:      98 % on Room air Temp:     99.4 degrees F (37.44 degrees C) oral Pulse rate:   80 / minute Pulse rhythm:   regular BP sitting:   126 / 72  (left arm) Cuff size:   large  Vitals Entered By: Rebeca Alert CMA Deborra Medina) (May 11, 2010 9:52 AM)  O2 Flow:  Room air CC: Follow-up visit/aj Is Patient Diabetic? Yes   Primary Provider:  bouska  CC:  Follow-up visit/aj.  History of Present Illness: the status of at least 3 ongoing medical problems is addressed today: gerd:  she says nexium works well, but is too expensive. htn:  pt says she has intermittent dizziness.  she continues to lose weight.   dm:  she says it is well-controlled, but medication asistance does not pay for parlodel.   she tolerated it well  Current Medications (verified): 1)  Depo-Provera 150 Mg/ml Im Susp (Medroxyprogesterone Acetate) .... Take 1 Shot Q 3 Months 2)  Iron 325 (65 Fe) Mg  Tabs (Ferrous Sulfate) .... Take 2 By Mouth Qd 3)  Multivitamins   Tabs (Multiple Vitamin) .... Take 1 By Mouth Two Times A Day 4)  Vitamin B-12 .... Once Daily 5)  Ultram Er 200 Mg  Tb24 (Tramadol Hcl) .... Take 1-2 Q 4 Hours Prn 6)  Ascensia Contour Test   Strp (Glucose Blood) .... Use 4 Times A Day 7)  Glucophage Xr 500 Mg Tb24 (Metformin Hcl) .... 4 Tablets Daily 8)  Oxycodone Hcl 30 Mg  Tabs (Oxycodone Hcl) .... One Tablet Every 3 Hours As Needed For Pain 9)  Zofran Odt 4 Mg  Tbdp (Ondansetron) .... Every 4 Hours As Needed For Nausea 10)  Thioridazine Hcl 25 Mg  Tabs (Thioridazine Hcl) .Marland Kitchen.. 1 By Mouth Bid 11)  Promethazine Hcl 25 Mg  Tabs (Promethazine Hcl) .Marland Kitchen.. 1 By Mouth Qid Prn 12)  Trazodone Hcl 100 Mg  Tabs (Trazodone Hcl) .... Take 2 By Mouth Qhs 13)  Diovan Hct 80-12.5 Mg Tabs (Valsartan-Hydrochlorothiazide) .... 1/2 Tab Once Daily 14)  Eq  Omeprazole 20 Mg Tbec (Omeprazole) .Marland Kitchen.. 1 Tab Two Times A Day 15)  Zinc Tab .Marland Kitchen.. 1 Tab Daily 16)  Bromocriptine Mesylate 2.5 Mg Tabs (Bromocriptine Mesylate) .Marland Kitchen.. 1 Tab Two Times A Day 17)  Januvia 100 Mg Tabs (Sitagliptin Phosphate) .Marland Kitchen.. 1 Tab Once Daily  Allergies (verified): 1)  ! Sulfa  Past History:  Past Medical History: Last updated: 01/14/2008 Contraction Morbid Obesity DM (ICD-250.00) HICCUPS (ICD-786.8) URI (ICD-465.9) DEPRESSION (ICD-311) ANXIETY (ICD-300.00) ANEMIA-IRON DEFICIENCY (ICD-280.9) LUMBAR RADICULOPATHY (ICD-724.4) ENCOUNTER FOR LONG-TERM USE OF OTHER MEDICATIONS (ICD-V58.69) GOITER, NONTOXIC MULTINODULAR (ICD-241.1) UNSPECIFIED NEURALGIA NEURITIS AND RADICULITIS (ICD-729.2) HYPERTENSION (ICD-401.9)  Review of Systems  The patient denies hypoglycemia and syncope.    Physical Exam  General:  obese.  no distress  Extremities:  no edema    Impression & Recommendations:  Problem # 1:  GERD (ICD-530.81) well-controlled, but she wants a cheaper med  Problem # 2:  DM (ICD-250.00) therapy limited by pt's request for least expensive meds  HgbA1C: 8.0 (03/09/2010)  Problem # 3:  HYPERTENSION (ICD-401.9) slightly overcontrolled, due to her weight loss.   Medications Added to Medication List This Visit: 1)  Diovan 80 Mg Tabs (Valsartan) .Marland Kitchen.. 1 tab once daily 2)  Nexium 40 Mg Cpdr (Esomeprazole magnesium) .Marland Kitchen.. 1 tab once daily  Other Orders: Est. Patient Level III (30940)  Patient Instructions: 1)  change diovan-hct to diovan 80 mg once daily. 2)  Please schedule a follow-up appointment in 6 weeks. 3)  stop bromocriptine. 4)  ou have already had a pneumonia vaccine, but you had a tetanus in 2009. 5)  change omeprazole to nexium 40 mg once daily. Prescriptions: NEXIUM 40 MG CPDR (ESOMEPRAZOLE MAGNESIUM) 1 tab once daily  #30 x 11   Entered and Authorized by:   Donavan Foil MD   Signed by:   Donavan Foil MD on 05/11/2010   Method used:    Faxed to ...       [facility] (retail)       [address] [place]       [place], [place]  [address]       Ph: [phone]       Fax: [phone]   RxID:   [id number] DIOVAN 80 MG TABS (VALSARTAN) 1 tab once daily  #30 x 11   Entered and Authorized by:   Donavan Foil MD   Signed by:   Donavan Foil MD on 05/11/2010   Method used:   Faxed to ...       [facility] (retail)       [address] [place]       [place], [place]  [address]       Ph: [phone]       Fax: [phone]   RxID:   [id number]    Orders Added: 1)  Est. Patient Level III [16837]

## 2010-06-01 ENCOUNTER — Telehealth: Payer: Self-pay

## 2010-06-01 MED ORDER — METFORMIN HCL ER 500 MG PO TB24: 500.0000 mg | ORAL_TABLET | Freq: Four times a day (QID) | ORAL | Status: DC

## 2010-06-01 NOTE — Telephone Encounter (Signed)
She can have metformin rx , but she is not on glimepiride.

## 2010-06-01 NOTE — Telephone Encounter (Signed)
Pt informed, Rx in cabinet for pt pick up

## 2010-06-01 NOTE — Telephone Encounter (Signed)
Pt called requesting hard copy Rxs for glucophage and glimeparide. Pt states that she was advised at [place] (05/11/2010) that MD would be adding this medication, however there is no documentation of this and medication is not active on med list. Please advise

## 2010-06-22 ENCOUNTER — Ambulatory Visit: Payer: Self-pay | Admitting: Endocrinology

## 2010-07-11 NOTE — Consult Note (Signed)
[facility]                          ENDOCRINOLOGY CONSULTATION   DREW, HERMAN                      MRN:          [id number]  DATE:08/05/2006                            DOB:          Oct 29, 1960    REASON FOR PRESENTATION:  Follow up diabetes.   HISTORY OF PRESENT ILLNESS:  Patient is a 50 year old woman, who  continues to reduce her insulin due to hypoglycemia and states her  diabetes is, in general, well-controlled.   She is now six months status post lap band surgery and she continues to  lose weight.   Regarding her iron deficiency anemia, she takes iron 325 mg a day.   PAST MEDICAL HISTORY:  1. Hypertension.  2. Contraception.   REVIEW OF SYSTEMS:  Denies nausea, vomiting or diarrhea.   PHYSICAL EXAMINATION:  Blood pressure 141/73, heart rate 89, temperature  99.2, the weight is 362.  GENERAL:  Obese, no distress.  FEET:  Normal color and temperature.  There is no ulcer present on the  feet.  There is no edema.  Dorsalis pedis pulses are intact bilaterally  and sensation is intact to touch.   LABORATORY STUDIES:  Hemoglobin A1c 6.9.  Hemoglobin is 10.4.  Iron  saturation 10.2%.  Vitamin B12 is normal at 748.  BMET is normal, except  for a glucose of 215.   IMPRESSION:  1. Ongoing weight-loss, due to lap band surgery.  2. Iron deficiency anemia.  She needs an increase in her therapy.  3. Type 2 diabetes.  Her insulin requirement continues to decrease      with her weight-loss.   PLAN:  1. Continue to decrease your insulin (she takes about 60 units total      per day, as of now).  2. Increase iron to 325 mg twice a day.  3. Laboratory results are being sent to Dr. Derrel Nip in [place].  4. Return in six months.    Sean A. Loanne Drilling, MD  Electronically Signed   SAE/MedQ  DD: 08/08/2006  DT: 08/09/2006  Job #: [id number]   cc:   Bernerd Limbo, M.D.

## 2010-07-14 NOTE — Op Note (Signed)
[facility]  Patient:    Suzanne Duke, Suzanne Duke                        MRN: [id number] Proc. Date: 09/12/00 Adm. Date:  [id number] Attending:  Horatio Pel CC:         Lynnell Chad. Shelia Media, M.D.   Operative Report  ACCOUNT:  [id number]  PREOPERATIVE DIAGNOSIS:  Chronic calculus cholecystitis with recent episode of biliary pancreatitis.  POSTOPERATIVE DIAGNOSIS:  Chronic calculus cholecystitis with recent episode of biliary pancreatitis.  OPERATION:  Laparoscopic cholecystectomy with operative cholangiogram.  SURGEON:  Haywood Lasso, M.D.  ASSISTANT:  Orson Ape. Rise Patience, M.D.  ANESTHESIA:  General endotracheal.  CLINICAL HISTORY:  This patient is a 50 year old woman recovering now from an episode of biliary pancreatitis and with the finding of multiple stones including some large and small ones within the gallbladder.  DESCRIPTION OF PROCEDURE:  The patient was brought to the operating room and after satisfactory general endotracheal anesthesia had been obtained, the abdomen was prepped and draped.  I injected 0.25% Marcaine for each of the incisions for the laparoscopic cholecystectomy.  An umbilical incision was made first, and she is markedly obese and had a very deep umbilicus, but I was able to incise the lower edge of the umbilical skin and identify the fascia, picked it up and opened it.  The peritoneal cavity was entered under direct vision and a pursestring of 0 Vicryl placed to hold the Hasson in place.  The Hasson was introduced and the abdomen insufflated to 15.  No gross abnormalities were noted, but the gallbladder had some evidence of chronic inflammation and edema.  A 10 mm trocar was placed in the epigastrium and two 5 mm laterally under direct vision.  We attempted initially to retract the gallbladder up over the liver and try to dissect an area of the cystic duct but had poor visualization I think primarily because of her  obesity and gave [place] difficulty with retraction.  We got the 30 degree camera out.  This gave [place] better visualization, but we were still not able to get the gallbladder retracted over the liver properly, so we put an extra 5 mm trocar high and lateral, and this gave [place] much better traction.  I was then able to open the peritoneum over the area of the area of the cystic duct and found that the common duct was tented up over the cystic duct, and I was able to dissect that off once I had that identified.  The cystic duct was freed up for its entire length, and the common duct was identified as well.  Initially, I could not see the cystic artery and because of lack of flexibility, these tissues could not really open posteriorly into the triangle but had, as noted, identified the common duct.  A clip was placed on the cystic duct and an Angiocath was used to thread a catheter into the cystic duct where operative cholangiography was done.  This showed good filling into the duodenum and filling into the hepatic radicles.  The cystic catheter was removed and the cystic duct clipped with four clips. It was divided.  As we came up, I saw what was either a small vessel or a lymphatic which was clipped and divided and then as we worked the gallbladder off of the bed of the liver with the cautery, found another branch that was clearly the artery which was double clipped and divided.  Almost at the tip of the fundus of the gallbladder, was a large venous connection which was clipped and divided.  The  gallbladder was placed in a bag and brought out through the umbilical port.  I had to open the gallbladder and pull multiple stones out through this to get the gallbladder to come out through the umbilicus.  Once this was done, I let the abdomen deflate and then put a figure-of-eight #1 Novofil into the fascia at the umbilicus for better closure.  Because of her weight, I was afraid the 0 Vicryl would not  be adequate strength.  This was tied down as was the Vicryl.  The abdomen was reinflated.  We made a final check, and everything appeared to be dry.  We suctioned out the remaining fluid.  The lateral ports were removed under direct vision.  The epigastric port was used to do the final deflation of the abdomen.  The skin was closed with 4-0 Monocryl, subcuticular, and Steri-Strips.  The patient tolerated the procedure well.  There were no operative complications.  All counts were correct. DD:  09/12/00 TD:  09/12/00 Job: [id number]

## 2010-07-14 NOTE — Discharge Summary (Signed)
[facility]  Patient:    Suzanne Duke, Suzanne Duke                        MRN: [id number] Adm. Date:  [id number] Disc. Date: [id number] Attending:  Horatio Pel CC:         Haywood Lasso, M.D.   Discharge Summary  LABORATORY RESULTS:  EKG:  Normal sinus rhythm, nonspecific T-wave abnormality, no old tracing for comparisons.  Initial white count slightly high at 12.9 and hematocrit 34.1.  Repeat on September 12, 2000, 32.1% and platelet count normal.  Initial sodium low at 130. It was 141 before discharge.  The potassium levels were transient low, as low as 3.3.  Glucose levels 326, 252, 142, and 157.  Albumin slightly low at 3.2. Initial ALT and AST were high.  On September 13, 2000, the AST was 22, ALT 85, and alkaline phosphatase 170.  The total bilirubin, which had been 1.6, was 0.9. The initial amylase was 284 and lipase 77.  The last levels were 100 and 20, respectively.  The urinalysis showed positive nitrites with 0-2 white cells.  HOSPITAL COURSE:  Please see the admission history for details for presentation.  Briefly, she had several days of upper abdominal pain.  She was felt to have biliary pancreatitis.  A consultation was obtained with Earnstine Regal, M.D., of surgery.  He recommended surgery.  This was performed by Haywood Lasso, M.D., without complications.  She was feeling better, diabetes was under fairly good control, and she was discharged on September 13, 2000.  IMPRESSION: 1. Pancreatitis secondary to gallstones. 2. Cholelithiasis, status post cholecystectomy in July of 2002. 3. Morbid obesity. 4. Diabetes mellitus. 5. Depression. 6. Allergic rhinitis. 7. History of Helicobacter positive in July of 2002. 8. Hypertension. 9. History of rectal abscess repair. DD:  09/28/00 TD:  09/30/00 Job: [id number]

## 2010-07-14 NOTE — Op Note (Signed)
Suzanne Duke, Suzanne Duke                           ACCOUNT NO.:  [id number]   MEDICAL RECORD NO.:  [id number]                   PATIENT TYPE:  AMB   LOCATION:  DAY                                  FACILITY:  [facility]   PHYSICIAN:  Haywood Lasso, M.D.           DATE OF BIRTH:  12/17/1960   DATE OF PROCEDURE:  01/02/2002  DATE OF DISCHARGE:                                 OPERATIVE REPORT   PREOPERATIVE DIAGNOSIS:  Chronic anal abscess with fistula.   POSTOPERATIVE DIAGNOSIS:  Chronic anal abscess with fistula.   OPERATION:  Examination under anesthesia with Procto and debridement and  drainage of chronic posterior anal intersphincteric abscess and partial  fistulotomy.   SURGEON:  Haywood Lasso, M.D.   ANESTHESIA:  General endotracheal.   CLINICAL HISTORY:  This patient is a 50 year old who many years ago had a  giant horseshoe perirectal abscess which was drained and she has done well  until recently and she presented with a posterior abscess which was drained  in the office but always had contained drainage. We were never able to get a  good exam in the office. The patient is morbidly obese and good exposure was  impossible. We elected to bring her to the operating room for a general  anesthetic and exam under anesthesia and hopefully repair of her fistula.   DESCRIPTION OF PROCEDURE:  The patient was seen in the holding area and had  no further questions. She was taken to the operating room and after  satisfactory general endotracheal anesthesia had been obtained was placed in  lithotomy position. The perianal area was prepped and we did a Procto to 25  cm which was basically normal with the exception of what appeared to be  chronic anal fissure directly posteriorly in the area of the abscess. There  was no palpable mass on rectal exam and I put the anoscope in and examined  this area that was open and raw surface but there was a chronic  intersphincteric abscess  cavity between the internal and external  sphincters. A probe went in that nicely. There was no higher up internal  opening that could be identified either by probing or injecting some  peroxide into the abscess cavity. Dr. Lennie Hummer who had seen the patient  also in the office came in and looked and we agreed that the best approach  here was to open this cracked out laterally to the left and posterior which  was where it had previously been drained and debride the cavity. This was  done with the cautery and bleeders electrocoagulated. This did require  cutting a portion of the external sphincter but the internal sphincter was  left intact. The area was suctioned out and debrided both with a curette and  sponge that we used to wipe all the chronic granulation tissue. I injected  some 0.25% Marcaine with epinephrine to help with postop  analgesia.   Once everything was dry, I put some Gelfoam into the cavity and sterile  dressings. The patient tolerated the procedure well. There were no operative  complications. All counts are correct.                                                Haywood Lasso, M.D.    CJS/MEDQ  D:  01/02/2002  T:  01/02/2002  Job:  [id number]

## 2010-07-14 NOTE — H&P (Signed)
[facility]  Patient:    Suzanne Duke, Suzanne Duke                        MRN: [id number] Adm. Date:  09/10/00 Attending:  Lynnell Chad. Shelia Media, M.D. Dictator:   Lessie Dings. Barefoot, P.A.                         History and Physical  DATE OF BIRTH:  [birth year].  CHIEF COMPLAINT:  Abdominal pain, nausea and vomiting.  HISTORY OF PRESENT ILLNESS:  This 50 year old black female patient of Dr. Shelia Media is admitted for gallstones, uncontrolled insulin-dependent diabetes mellitus, nausea, vomiting, and pancreatitis. She presented to the office yesterday describing acute onset on Friday evening of nausea, vomiting and diarrhea following a dinner of nachos and cheese and a hamburger.  Diarrhea had really resolved at yesterdays visit, as had nausea and vomiting.  She has also had some pain at the umbilicus during the weekend and was still slightly tender in that area yesterday.  However, yesterday she was able to drink a ginger ale and fluids easily without any further nausea and vomiting. Throughout this period, she had no fever or chills.  She also denies any melena, hematochezia, chest pain, cough, shortness of breath, or change in urinary habits.  She was placed on Nexium yesterday thinking this was a more or a gastritis that she has had previously.  She does believe that the Nexium helped to some degree.  She is diabetic and was seeing the ___ in our office but for some reason has been noncompliant and has not kept her visits since March of this year.  A laboratory test in our office yesterday, a blood sugar elevated at 402, blood count with normal white count of 8.2. However, her total bilirubin was elevated at 1.9, alkaline phosphatase elevated at 354, AST and ALT increased at 209 and 413 respectively.  H. pylori was also a positive. The patient was called yesterday evening and although she stated that she did feel some improvement, she was sent for a stat gallbladder  and abdominal ultrasound at [facility].  Those results were just called to [place] and she does have multiple gallstones, but there is no evidence of an obstruction.  She is back in the office now for evaluation and does have, again, tenderness at the umbilical area somewhat increased from yesterday.  There is no rebound pain and no evidence of an acute abdomen.  She continues to be afebrile. Dr. Shelia Media has evaluated the patient and agrees that she should be admitted for further evaluation and treatment.  CURRENT MEDICATIONS: 1. Glucovance 5/500 one q.d. 2. Insulin 70/30 20 units a.m.; 35 units p.m. 3. Depo-Provera injection q.84mo 4. Celexa 20 mg one and one-half q.d. 5. Diovan/HCT 80/12.5 one q.d. 6. Claritin p.r.n. 7. Nasonex p.r.n.  ALLERGIES:  SULFA.  SOCIAL HISTORY:  She is married and has one son.  She is nonsmoker and drinks alcohol only socially.  Her work is at ___ [employer]  FAMILY HISTORY:  Her mother died in her sleep of myocardial infarction.  Her father died at 89of prostate cancer.  She is the youngest of 172children with history of diabetes mellitus and hypertension.  PAST MEDICAL HISTORY:  Hypertension, insulin-dependent diabetes mellitus, morbid obesity, depression, history of perirectal abscess repaired by Dr. SMargot Chimes  REVIEW OF SYSTEMS:  Positive as described above.  Denies fever, chills, cold symptoms, cough, chest pain,  shortness of breath, any dysuria, urinary frequency, melena, or hematochezia.  Again, did have diarrhea that has resolved.  OBJECTIVE:  GENERAL:  Age 43, weight greater than 350 pounds.  A pleasant well-developed, well-nourished obese black female in no acute distress.  She is resting comfortably.  VITAL SIGNS: Temperature 98.5, blood pressure 122/70.  SKIN:  Warm and dry without rash.  HEENT:  Normocephalic, atraumatic.  TMs are clear.  Oropharynx is clear.  NECK:  Supple without lymphadenopathy, bruit or thyromegaly.  LUNGS:  Clear to  auscultation bilaterally.  CARDIOVASCULAR:  Normal sinus rhythm, no murmur, rub or gallop.  ABDOMEN:  Obese.  Bowel sounds are present throughout.  She is mildly tender at the umbilicus.  There is no rebound mass or organomegaly.  Negative Murphy sign.  There is no right upper quadrant, left upper quadrant or right lower quadrant tenderness.  GENITOURINARY:  Deferred.  RECTAL EXAM:  Completed.  Heme negative bowel stool.  EXTREMITIES:  Without clubbing, cyanosis or edema.  NEUROLOGICAL EXAM:  Cranial nerves II through XII grossly intact without focal deficit.  ASSESSMENT:  1. Cholelithiasis without obstruction, ultrasound was completed [facility] and we     have asked them to fax results over to [facility].  2. Nausea and vomiting.  3. Insulin-dependent diabetes mellitus, uncontrolled.  4. Pancreatitis.  5. Abdominal pain, uncertain etiology.  May be secondary to the above but     secondary to its location at the umbilicus with a positive Helicobacter     pylori and history of gastritis, cannot entirely rule out ulcerative     disease.  6. Depression treated with Celexa.  7. Hypertension, controlled.  8. History of rectal abscess repair.  9. Note, Helicobacter antibody positive September 10, 2000. 10. Note, PPD was given September 10, 2000 and requires reading tomorrow,     July 18, 200.  PLAN:  Admit to Dr. Shelia Media, will place on n.p.o. and start IV fluids.  Will use glucometer for diabetes control.  Will follow her labs closely including CBC, amylase, lipase, CMP, and also check urinalysis today.  EKG will be completed on admission.  We have asked Dr. Margot Chimes or someone from his group to please complete a consultation.  Again, note, she was H. pylori positive yesterday, as well as a PPD was given September 10, 2000 and requires reading in 48 to 72 hours. DD:  09/10/00 TD:  09/10/00 Job: [id number]

## 2010-07-14 NOTE — Consult Note (Signed)
[facility]                          ENDOCRINOLOGY CONSULTATION   COLETA, GROSSHANS                      MRN:          [id number]  DATE:05/27/2006                            DOB:          1960-04-18    REASON FOR VISIT:  Follow up diabetes.   HISTORY OF PRESENT ILLNESS:  This is a 50 year old woman who is now  about four months status post bariatric surgery.  She has lost 100  pounds.  She has reduced her insulin to Lantus and Humalog, totaling 60  units a day.   Regarding her anemia, she continues to take her iron tablets once daily.   Regarding her hypertension, she is off her Diltiazem and still takes  Diovan/HCT 320/25 1 daily.   PAST MEDICAL HISTORY:  She still takes Depo-Provera for contraception.   REVIEW OF SYSTEMS:  Occasional mild hypoglycemia.  She denies diarrhea.   PHYSICAL EXAMINATION:  VITAL SIGNS:  Blood pressure 136/62, heart rate  64, temperature 98.3.  The weight is 389 pounds.  GENERAL:  In no distress.  EXTREMITIES:  No edema.   LABORATORY STUDIES:  On May 27, 2006, hemoglobin 11.1.  Iron  saturation 10, which is low.  Hemoglobin A1C is 7.3.   IMPRESSION:  1. Given the rapid change in her insulin requirements, now that she      has had her bariatric surgery, it is going to be hard for her to      safely keep her A1C below 7%.  2. Iron-deficiency anemia.  3. Regarding her hypertension, will have to reduce her medications in      anticipation of further weight loss.   PLAN:  1. Decrease Diovan/HCT to 160/12.5 once daily.  2. Continue to decrease her insulin, as indicated by your home glucose      readings.  3. Increase iron tablets to one pill twice daily.     Sean A. Loanne Drilling, MD  Electronically Signed    SAE/MedQ  DD: 05/28/2006  DT: 05/29/2006  Job #: [id number]   cc:   Bernerd Limbo, M.D.  [facility]

## 2010-07-14 NOTE — Consult Note (Signed)
[facility]  Patient:    Suzanne Duke, Suzanne Duke                        MRN: [id number] Proc. Date: 09/10/00 Adm. Date:  [id number] Attending:  Horatio Pel CC:         Lynnell Chad. Shelia Media, M.D.   Consultation Report  REASON FOR CONSULTATION:  Abdominal pain, cholelithiasis, biliary pancreatitis.  BRIEF HISTORY:  The patient is a 50 year old black female, admitted today by Dr. Deland Pretty for abdominal pain, nausea, vomiting, and biliary pancreatitis.  The patient had presented with a four day history of abdominal pain, nausea, and vomiting.  She was seen in the office on Monday, July 15. Laboratory studies subsequently revealed an elevated amylase of 846 and elevated liver function tests with a blood sugar of 402.  The patient was brought to [facility] and underwent abdominal ultrasound showing multiple gallstones.  The patient was admitted for work-up of abdominal pain and control of diabetes.  The patient has had no prior such episodes.  She denies any previous intermittent abdominal pain.  She denies any hepatobiliary or pancreatic disease.  Previous surgery has included drainage of perirectal abscess by Dr. Neldon Mc in 1989.  The patient denies any previous history of jaundice or acholic stools.  She denies hematemesis.  PAST MEDICAL HISTORY:  History of insulin-dependent diabetes mellitus, history of morbid obesity, history of hypertension, history of depression, status post drainage of perirectal abscess.  MEDICATIONS: 1. Glucovance 5/500 daily. 2. Insulin 70/30, 20 units q.a.m. 35 units q.p.m. 3. Depo-Provera q 3 months. 4. Celexa 20 mg 1-1/2 tablets daily. 5. Diovan with HCT 80/12.5 q.d. 6. Claritin p.r.n. 7. Nasonex p.r.n.  ALLERGIES:  SULFA causes hives.  SOCIAL HISTORY:  The patient is married with one child.  She does not smoke. She drinks alcohol on rare occasion.  FAMILY HISTORY:  Notable for  myocardial infarction in the patients mother and prostate cancer in the patients father.  PHYSICAL EXAMINATION:  GENERAL:  Pleasant 50 year old black female on third floor ward [place]:  Temperature 98.5, blood pressure 122/70, weight in excess of 350 pounds.  HEENT:  Normocephalic.  Sclerae are clear.  Dentition is fair.  NECK:  Supple without masses, thyroid normal without nodularity.  LUNGS:  Clear to auscultation.  There are no rales or wheezes.  CARDIAC:  Regular rate and rhythm.  ABDOMEN:  Obese.  There are numerous injection sites noted.  There are bowel sounds present on auscultation.  There is tenderness in the mid abdomen and the periumbilical region and epigastrium.  There are no palpable masses. There is mild voluntary guarding.  There is no rebound tenderness.  EXTREMITIES:  Nontender without edema.  NEUROLOGIC:  The patient is alert and oriented to person, place, and time without focal neurologic deficit.  LABORATORY STUDIES:  Dated September 10, 2000, white count 12.9, hemoglobin 11.1, platelet count 346,000.  Chemistries are notable for a sodium of 130, potassium 3.6, glucose 326.  Liver function tests show a total bilirubin of 1.6 which is down from 1.9, a decrease alkaline phosphatase of 276, a decreased SGPT of 213, a normal calcium of 8.9, a falling amylase of 284 and a lipase of 156.  IMPRESSION: 1. Biliary pancreatitis. 2. Cholelithiasis. 3. Uncontrolled diabetes mellitus. 4. Morbid obesity.  PLAN: 1. Agree with intravenous hydration, n.p.o. status, and control of nausea. 2. Will add Demerol intermittently for pain control.  3. Repeat laboratory studies in a.m. September 11, 2000. 4. Will recommend cholecystectomy this admission with intraoperative    cholangiography to rule out choledocholithiasis. DD:  09/10/00 TD:  09/11/00 Job: [id number]

## 2010-08-11 ENCOUNTER — Emergency Department (HOSPITAL_COMMUNITY)
Admission: EM | Admit: 2010-08-11 | Discharge: 2010-08-11 | Disposition: A | Discharge status: Home / Self Care | Payer: Self-pay | Attending: Emergency Medicine | Admitting: Emergency Medicine

## 2010-08-11 DIAGNOSIS — F1010 Alcohol abuse, uncomplicated: Secondary | ICD-10-CM | POA: Insufficient documentation

## 2010-08-11 DIAGNOSIS — E119 Type 2 diabetes mellitus without complications: Secondary | ICD-10-CM | POA: Insufficient documentation

## 2010-08-11 DIAGNOSIS — I10 Essential (primary) hypertension: Secondary | ICD-10-CM | POA: Insufficient documentation

## 2010-08-11 LAB — URINALYSIS, ROUTINE W REFLEX MICROSCOPIC
Glucose, UA: 250 mg/dL — AB
Leukocytes, UA: NEGATIVE
Urobilinogen, UA: 0.2 mg/dL (ref 0.0–1.0)

## 2010-08-11 LAB — COMPREHENSIVE METABOLIC PANEL
Albumin: 3.9 g/dL (ref 3.5–5.2)
CO2: 17 mEq/L — ABNORMAL LOW (ref 19–32)
Calcium: 9.6 mg/dL (ref 8.4–10.5)
Creatinine, Ser: 0.63 mg/dL (ref 0.50–1.10)
GFR calc Af Amer: >60 mL/min (ref >60)
GFR calc non Af Amer: >60 mL/min (ref >60)
Glucose, Bld: 211 mg/dL — ABNORMAL HIGH (ref 70–99)
Sodium: 136 mEq/L (ref 135–145)
Total Bilirubin: 0.5 mg/dL (ref 0.3–1.2)
Total Protein: 7.5 g/dL (ref 6.0–8.3)

## 2010-08-11 LAB — DIFFERENTIAL
Basophils Absolute: 0 10*3/uL (ref 0.0–0.1)
Eosinophils Absolute: 0 10*3/uL (ref 0.0–0.7)
Eosinophils Relative: 0 % (ref 0–5)
Lymphocytes Relative: 24 % (ref 12–46)
Monocytes Absolute: 0.4 10*3/uL (ref 0.1–1.0)
Monocytes Relative: 9 % (ref 3–12)

## 2010-08-11 LAB — CBC
HCT: 38.3 % (ref 36.0–46.0)
MCH: 30.3 pg (ref 26.0–34.0)
MCHC: 33.2 g/dL (ref 30.0–36.0)
MCV: 91.4 fL (ref 78.0–100.0)
Platelets: 225 10*3/uL (ref 150–400)
WBC: 5.1 10*3/uL (ref 4.0–10.5)

## 2010-08-11 LAB — ETHANOL: Alcohol, Ethyl (B): 51 mg/dL — ABNORMAL HIGH (ref 0–11)

## 2010-08-11 LAB — RAPID URINE DRUG SCREEN, HOSP PERFORMED
Barbiturates: NOT DETECTED
Opiates: NOT DETECTED

## 2010-09-07 ENCOUNTER — Other Ambulatory Visit: Payer: Self-pay | Admitting: Endocrinology

## 2010-09-07 DIAGNOSIS — Z1231 Encounter for screening mammogram for malignant neoplasm of breast: Secondary | ICD-10-CM

## 2010-09-15 ENCOUNTER — Ambulatory Visit (HOSPITAL_COMMUNITY)
Admission: RE | Admit: 2010-09-15 | Discharge: 2010-09-15 | Disposition: A | Discharge status: Home / Self Care | Payer: Self-pay | Source: Ambulatory Visit | Attending: Endocrinology | Admitting: Endocrinology

## 2010-09-15 DIAGNOSIS — Z1231 Encounter for screening mammogram for malignant neoplasm of breast: Secondary | ICD-10-CM

## 2010-12-13 ENCOUNTER — Other Ambulatory Visit: Payer: Self-pay | Admitting: *Deleted

## 2010-12-13 MED ORDER — METFORMIN HCL ER 500 MG PO TB24: 500.0000 mg | ORAL_TABLET | Freq: Four times a day (QID) | ORAL | Status: DC

## 2010-12-13 NOTE — Telephone Encounter (Signed)
R'cd fax from [facility] Department for refill of Metformin  Last OV-05/11/2010  Last filled-12/06/2010

## 2011-03-06 ENCOUNTER — Other Ambulatory Visit: Payer: Self-pay | Admitting: *Deleted

## 2011-03-06 MED ORDER — SITAGLIPTIN PHOSPHATE 100 MG PO TABS: 100.0000 mg | ORAL_TABLET | Freq: Every day | ORAL | Status: DC

## 2011-03-06 NOTE — Telephone Encounter (Signed)
R'cd fax from [facility] Department for refill of Januvia  Last OV-04/2010  Last filled-12/11/2010

## 2011-03-16 ENCOUNTER — Ambulatory Visit: Payer: Self-pay | Admitting: Endocrinology

## 2011-03-19 ENCOUNTER — Other Ambulatory Visit (INDEPENDENT_AMBULATORY_CARE_PROVIDER_SITE_OTHER): Payer: Self-pay

## 2011-03-19 ENCOUNTER — Encounter: Payer: Self-pay | Admitting: Endocrinology

## 2011-03-19 ENCOUNTER — Ambulatory Visit: Payer: Self-pay | Admitting: Endocrinology

## 2011-03-19 ENCOUNTER — Ambulatory Visit (INDEPENDENT_AMBULATORY_CARE_PROVIDER_SITE_OTHER): Payer: Self-pay | Admitting: Endocrinology

## 2011-03-19 VITALS — BP 142/80 | HR 70 | Temp 99.8°F | Ht 68.0 in | Wt 225.8 lb

## 2011-03-19 DIAGNOSIS — E119 Type 2 diabetes mellitus without complications: Secondary | ICD-10-CM

## 2011-03-19 MED ORDER — ESCITALOPRAM OXALATE 20 MG PO TABS: 20.0000 mg | ORAL_TABLET | Freq: Every day | ORAL | Status: DC

## 2011-03-19 MED ORDER — GLIMEPIRIDE 1 MG PO TABS: 1.0000 mg | ORAL_TABLET | Freq: Every day | ORAL | Status: DC

## 2011-03-19 MED ORDER — VALSARTAN 160 MG PO TABS: 160.0000 mg | ORAL_TABLET | Freq: Every day | ORAL | Status: DC

## 2011-03-19 NOTE — Patient Instructions (Addendum)
blood tests are being requested for you today.  please call [phone] to hear your test results.  You will be prompted to enter the 9-digit "MRN" number that appears at the top left of this page, followed by #.  Then you will hear the message. Increase lexapro to 20 mg daily, and iIncrease diovan to 160 mg daily.  i have sent prescriptions to your pharmacy. Please come back for a follow-up appointment in 3 months.   (addendum: i removed tramadol from med list).

## 2011-03-19 NOTE — Progress Notes (Signed)
Subjective:    Patient ID: Suzanne Duke, female    DOB: 01/01/1961, 51 y.o.   MRN: [id number]  HPI The state of at least three ongoing medical problems is addressed today:   Pt returns for f/u of type 2 DM (1992).  She had bariatric surgery (gaastric bypass), in 2008.  She has regained weight, since she was last here, in early 2012.  pt states she feels well in general.  no cbg record, but states cbg's vary from 100-200.    Depression: lexapro works "ok," but is is expensive, and the effect is incomplete.  She has received treatment for alcoholism--none x 8 mos.  She is engaged to be married.  She will regain her health insurance soon. Low-back pain:  She no longer takes the tramadol.  It is not to be used with lexapro. Past Medical History  Diagnosis Date  . ANEMIA-IRON DEFICIENCY 02/17/2007  . ANXIETY 02/17/2007  . DEPRESSION 02/17/2007  . DM 07/17/2007  . Edema 07/21/2008  . GERD 05/11/2010  . GOITER, NONTOXIC MULTINODULAR 01/07/2007  . HYPERTENSION 09/21/2006  . LUMBAR RADICULOPATHY 01/13/2007  . UNSPECIFIED NEURALGIA NEURITIS AND RADICULITIS 01/07/2007  . Morbid obesity     Past Surgical History  Procedure Date  . Cholecystectomy 1992  . Treatment fistula anal 1993  . Gastric bypass open 2008  . Electrocardiogram 08/22/2005  . Incision and drainage perirectal abscess 1989    s/p    History   Social History  . Marital Status: Single    Spouse Name: N/A    Number of Children: N/A  . Years of Education: N/A   Occupational History  . Not on file.   Social History Main Topics  . Smoking status: Current Everyday Smoker  . Smokeless tobacco: Not on file  . Alcohol Use: Yes  . Drug Use: Not on file  . Sexually Active: Not on file   Other Topics Concern  . Not on file   Social History Narrative  . No narrative on file    Current Outpatient Prescriptions on File Prior to Visit  Medication Sig Dispense Refill  . esomeprazole (NEXIUM) 40 MG capsule Take 40 mg by  mouth daily.      . Ferrous Sulfate (IRON) 325 (65 FE) MG TABS Take 1 tablet by mouth 2 (two) times daily.       . Glucose Blood (ASCENSIA CONTOUR TEST VI) Use 4 times a day      . medroxyPROGESTERone (DEPO-PROVERA) 150 MG/ML injection Inject 150 mg into the muscle every 3 (three) months.      . metFORMIN (GLUCOPHAGE-XR) 500 MG 24 hr tablet Take 1 tablet (500 mg total) by mouth 4 (four) times daily.  360 tablet  1  . Multiple Vitamin (MULTIVITAMIN) tablet Take 1 tablet by mouth 2 (two) times daily.      . sitaGLIPtin (JANUVIA) 100 MG tablet Take 1 tablet (100 mg total) by mouth daily.  30 tablet  3  . traMADol (ULTRAM-ER) 200 MG 24 hr tablet Take 1-2 tablets by mouth every 4 hours as needed      . traZODone (DESYREL) 100 MG tablet Take 2 tablets by mouth at bedtime      . valsartan (DIOVAN) 80 MG tablet Take 80 mg by mouth daily.      . vitamin B-12 (CYANOCOBALAMIN) 1000 MCG tablet Take 1,000 mcg by mouth daily.        Allergies  Allergen Reactions  . Sulfonamide Derivatives     Family  History  Problem Relation Age of Onset  . Cancer Mother     Breast Cancer  . Cancer Sister 20    Colon Cancer    BP 142/80  Pulse 70  Temp(Src) 99.8 F (37.7 C) (Oral)  Ht 5' 8"  (1.727 m)  Wt 225 lb 12.8 oz (102.422 kg)  BMI 34.33 kg/m2  SpO2 98%  Review of Systems denies hypoglycemia, and numbess of the feet    Objective:   Physical Exam VITAL SIGNS:  See vs page GENERAL: no distress Pulses: dorsalis pedis intact bilat.   Feet: no deformity.  no ulcer on the feet.  feet are of normal color and temp.  no edema Neuro: sensation is intact to touch on the feet.       Assessment & Plan:  DM, uncertain control Depression, needs increased rx HTN, needs increased rx

## 2011-03-22 ENCOUNTER — Telehealth: Payer: Self-pay | Admitting: *Deleted

## 2011-03-22 NOTE — Telephone Encounter (Signed)
Pt states that [facility] Department does not carry Glimepiride. They are asking medication be changed to either Glucotrol or Glipizide-please advise.

## 2011-03-23 MED ORDER — GLIPIZIDE ER 2.5 MG PO TB24: 2.5000 mg | ORAL_TABLET | ORAL | Status: DC

## 2011-03-23 NOTE — Telephone Encounter (Signed)
Pt informed new rx for Glipizide sent to [facility] Department via VM and to callback office with any questions/concerns.

## 2011-03-23 NOTE — Telephone Encounter (Signed)
i changed rx and sent

## 2011-03-27 ENCOUNTER — Other Ambulatory Visit: Payer: Self-pay | Admitting: *Deleted

## 2011-03-27 MED ORDER — ESOMEPRAZOLE MAGNESIUM 40 MG PO CPDR: 40.0000 mg | DELAYED_RELEASE_CAPSULE | Freq: Every day | ORAL | Status: DC

## 2011-03-27 NOTE — Telephone Encounter (Signed)
R'cd fax from [facility] Department for refill of Nexium.

## 2011-03-28 ENCOUNTER — Telehealth: Payer: Self-pay | Admitting: *Deleted

## 2011-03-28 MED ORDER — SERTRALINE HCL 100 MG PO TABS: 100.0000 mg | ORAL_TABLET | Freq: Every day | ORAL | Status: DC

## 2011-03-28 NOTE — Telephone Encounter (Signed)
sent 

## 2011-03-28 NOTE — Telephone Encounter (Signed)
Rx faxed to [facility] Department, pt informed of new medication via VM and to callback office with any questions/concerns.

## 2011-03-28 NOTE — Telephone Encounter (Signed)
[facility] Department sent fax stating that they no longer receive Lexapro. They are asking for MD to change Lexapro to Zoloft (Zoloft no charge to pt). Please advise.

## 2011-05-14 NOTE — Progress Notes (Signed)
This encounter was created in error - please disregard.

## 2011-07-10 ENCOUNTER — Other Ambulatory Visit: Payer: Self-pay | Admitting: *Deleted

## 2011-07-10 MED ORDER — METFORMIN HCL ER 500 MG PO TB24: 500.0000 mg | ORAL_TABLET | Freq: Four times a day (QID) | ORAL | Status: DC

## 2011-07-10 NOTE — Telephone Encounter (Signed)
R'cd fax from [facility] Department for refill of Metformin

## 2011-08-27 ENCOUNTER — Other Ambulatory Visit (INDEPENDENT_AMBULATORY_CARE_PROVIDER_SITE_OTHER): Payer: BC Managed Care – PPO

## 2011-08-27 ENCOUNTER — Encounter: Payer: Self-pay | Admitting: Endocrinology

## 2011-08-27 ENCOUNTER — Ambulatory Visit (INDEPENDENT_AMBULATORY_CARE_PROVIDER_SITE_OTHER): Payer: BC Managed Care – PPO | Admitting: Endocrinology

## 2011-08-27 VITALS — BP 130/72 | HR 89 | Temp 98.7°F | Wt 229.0 lb

## 2011-08-27 DIAGNOSIS — E042 Nontoxic multinodular goiter: Secondary | ICD-10-CM

## 2011-08-27 DIAGNOSIS — M79642 Pain in left hand: Secondary | ICD-10-CM | POA: Insufficient documentation

## 2011-08-27 DIAGNOSIS — E119 Type 2 diabetes mellitus without complications: Secondary | ICD-10-CM

## 2011-08-27 DIAGNOSIS — Z79899 Other long term (current) drug therapy: Secondary | ICD-10-CM

## 2011-08-27 DIAGNOSIS — M2550 Pain in unspecified joint: Secondary | ICD-10-CM

## 2011-08-27 DIAGNOSIS — D509 Iron deficiency anemia, unspecified: Secondary | ICD-10-CM

## 2011-08-27 DIAGNOSIS — M79609 Pain in unspecified limb: Secondary | ICD-10-CM

## 2011-08-27 LAB — CBC WITH DIFFERENTIAL/PLATELET
Basophils Absolute: 0 10*3/uL (ref 0.0–0.1)
Eosinophils Absolute: 0 10*3/uL (ref 0.0–0.7)
HCT: 36.7 % (ref 36.0–46.0)
Lymphs Abs: 2.2 10*3/uL (ref 0.7–4.0)
MCV: 90.4 fl (ref 78.0–100.0)
Monocytes Absolute: 0.3 10*3/uL (ref 0.1–1.0)
Neutrophils Relative %: 49.1 % (ref 43.0–77.0)
Platelets: 212 10*3/uL (ref 150.0–400.0)
RDW: 13 % (ref 11.5–14.6)

## 2011-08-27 LAB — HEPATIC FUNCTION PANEL
ALT: 36 U/L — ABNORMAL HIGH (ref 0–35)
AST: 24 U/L (ref 0–37)
Alkaline Phosphatase: 103 U/L (ref 39–117)
Total Bilirubin: 0.4 mg/dL (ref 0.3–1.2)

## 2011-08-27 LAB — URINALYSIS, ROUTINE W REFLEX MICROSCOPIC
Bilirubin Urine: NEGATIVE
Hgb urine dipstick: NEGATIVE
Leukocytes, UA: NEGATIVE
Nitrite: POSITIVE
Urobilinogen, UA: 1 (ref 0.0–1.0)

## 2011-08-27 LAB — TSH: TSH: 0.88 u[IU]/mL (ref 0.35–5.50)

## 2011-08-27 LAB — LIPID PANEL
HDL: 62 mg/dL (ref >39.00)
LDL Cholesterol: 106 mg/dL — ABNORMAL HIGH (ref 0–99)
Total CHOL/HDL Ratio: 3
Triglycerides: 53 mg/dL (ref 0.0–149.0)
VLDL: 10.6 mg/dL (ref 0.0–40.0)

## 2011-08-27 LAB — HEMOGLOBIN A1C: Hgb A1c MFr Bld: 9.5 % — ABNORMAL HIGH (ref 4.6–6.5)

## 2011-08-27 MED ORDER — TRAMADOL HCL 50 MG PO TABS: 50.0000 mg | ORAL_TABLET | Freq: Three times a day (TID) | ORAL | Status: DC | PRN

## 2011-08-27 NOTE — Patient Instructions (Addendum)
blood tests and an x-ray are being requested for you today.  You will receive a letter with results. Let's also check an ultrasound.  you will receive a phone call, about a day and time for an appointment.  Please come back for a follow-up appointment in 3 months

## 2011-08-27 NOTE — Progress Notes (Signed)
Subjective:    Patient ID: Suzanne Duke, female    DOB: 1960/04/01, 50 y.o.   MRN: [id number]  HPI Pt states 1 month of moderate arthralgias of the hands, but no assoc fever.   Pt says she [facility] Past Medical History  Diagnosis Date  . ANEMIA-IRON DEFICIENCY 02/17/2007  . ANXIETY 02/17/2007  . DEPRESSION 02/17/2007  . DM 07/17/2007  . Edema 07/21/2008  . GERD 05/11/2010  . GOITER, NONTOXIC MULTINODULAR 01/07/2007  . HYPERTENSION 09/21/2006  . LUMBAR RADICULOPATHY 01/13/2007  . UNSPECIFIED NEURALGIA NEURITIS AND RADICULITIS 01/07/2007  . Morbid obesity     Past Surgical History  Procedure Date  . Cholecystectomy 1992  . Treatment fistula anal 1993  . Gastric bypass open 2008  . Electrocardiogram 08/22/2005  . Incision and drainage perirectal abscess 1989    s/p    History   Social History  . Marital Status: Single    Spouse Name: N/A    Number of Children: N/A  . Years of Education: N/A   Occupational History  . Not on file.   Social History Main Topics  . Smoking status: Current Everyday Smoker  . Smokeless tobacco: Not on file  . Alcohol Use: Yes  . Drug Use: Not on file  . Sexually Active: Not on file   Other Topics Concern  . Not on file   Social History Narrative  . No narrative on file    Current Outpatient Prescriptions on File Prior to Visit  Medication Sig Dispense Refill  . calcium carbonate (OS-CAL) 600 MG TABS Take 600 mg by mouth 2 (two) times daily.      Marland Kitchen esomeprazole (NEXIUM) 40 MG capsule Take 1 capsule (40 mg total) by mouth daily.  90 capsule  3  . Ferrous Sulfate (IRON) 325 (65 FE) MG TABS Take 1 tablet by mouth 2 (two) times daily.       Marland Kitchen glipiZIDE (GLUCOTROL XL) 2.5 MG 24 hr tablet Take 1 tablet (2.5 mg total) by mouth every morning.  90 tablet  3  . Glucose Blood (ASCENSIA CONTOUR TEST VI) Use 4 times a day      . medroxyPROGESTERone (DEPO-PROVERA) 150 MG/ML injection Inject 150 mg into the muscle every 3  (three) months.      . metFORMIN (GLUCOPHAGE-XR) 500 MG 24 hr tablet Take 1 tablet (500 mg total) by mouth 4 (four) times daily.  360 tablet  2  . Multiple Vitamin (MULTIVITAMIN) tablet Take 1 tablet by mouth 2 (two) times daily.      . sertraline (ZOLOFT) 100 MG tablet Take 1 tablet (100 mg total) by mouth daily.  90 tablet  3  . sitaGLIPtin (JANUVIA) 100 MG tablet Take 1 tablet (100 mg total) by mouth daily.  30 tablet  3  . traZODone (DESYREL) 100 MG tablet Take 2 tablets by mouth at bedtime      . valsartan (DIOVAN) 160 MG tablet Take 1 tablet (160 mg total) by mouth daily.  90 tablet  3  . vitamin B-12 (CYANOCOBALAMIN) 1000 MCG tablet Take 1,000 mcg by mouth daily.        Allergies  Allergen Reactions  . Sulfonamide Derivatives     Family History  Problem Relation Age of Onset  . Cancer Mother     Breast Cancer  . Cancer Sister 75    Colon Cancer    BP 130/72  Pulse 89  Temp 98.7 F (37.1 C) (Oral)  Wt  229 lb (103.874 kg)  SpO2 96%   Review of Systems She reports weight gain.  Denies numbness    Objective:   Physical Exam VITAL SIGNS:  See vs page. GENERAL: no distress. Neck: thyroid is at least 10x normal size, multinodular.   Hand: slight swelling of the mcp and pip joints, but no tendeness or deformity.  Lab Results  Component Value Date   WBC 4.9 08/27/2011   HGB 12.0 08/27/2011   HCT 36.7 08/27/2011   PLT 212.0 08/27/2011   GLUCOSE 211* 08/11/2010   CHOL 179 08/27/2011   TRIG 53.0 08/27/2011   HDL 62.00 08/27/2011   LDLCALC 106* 08/27/2011   ALT 36* 08/27/2011   AST 24 08/27/2011   NA 136 08/11/2010   K 3.8 08/11/2010   CL 100 08/11/2010   CREATININE 0.63 08/11/2010   BUN 12 08/11/2010   CO2 17* 08/11/2010   TSH 0.88 08/27/2011   HGBA1C 9.5* 08/27/2011   MICROALBUR 0.5 08/27/2011      Assessment & Plan:  Arthralgias, new, uncertain etiology DM.  She needs insulin elev transaminase, new, uncertain etiology Dyslipidemia, needs increased rx

## 2011-08-28 ENCOUNTER — Telehealth: Payer: Self-pay | Admitting: *Deleted

## 2011-08-28 NOTE — Telephone Encounter (Signed)
Called pt to inform of lab results, pt informed (letter also mailed to pt). 

## 2011-08-31 ENCOUNTER — Ambulatory Visit
Admission: RE | Admit: 2011-08-31 | Discharge: 2011-08-31 | Disposition: A | Discharge status: Home / Self Care | Payer: BC Managed Care – PPO | Source: Ambulatory Visit | Attending: Endocrinology | Admitting: Endocrinology

## 2011-08-31 DIAGNOSIS — M79641 Pain in right hand: Secondary | ICD-10-CM

## 2011-08-31 DIAGNOSIS — E042 Nontoxic multinodular goiter: Secondary | ICD-10-CM

## 2011-08-31 DIAGNOSIS — M79642 Pain in left hand: Secondary | ICD-10-CM

## 2011-09-01 ENCOUNTER — Encounter: Payer: Self-pay | Admitting: Endocrinology

## 2011-09-01 ENCOUNTER — Other Ambulatory Visit: Payer: Self-pay | Admitting: Endocrinology

## 2011-09-01 DIAGNOSIS — E042 Nontoxic multinodular goiter: Secondary | ICD-10-CM

## 2011-09-05 ENCOUNTER — Other Ambulatory Visit: Payer: Self-pay

## 2011-09-05 ENCOUNTER — Other Ambulatory Visit: Payer: Self-pay | Admitting: *Deleted

## 2011-09-05 MED ORDER — SERTRALINE HCL 100 MG PO TABS: 100.0000 mg | ORAL_TABLET | Freq: Every day | ORAL | Status: DC

## 2011-09-05 MED ORDER — TRAMADOL HCL 50 MG PO TABS: 50.0000 mg | ORAL_TABLET | Freq: Three times a day (TID) | ORAL | Status: AC | PRN

## 2011-09-05 MED ORDER — GLIPIZIDE ER 2.5 MG PO TB24: 2.5000 mg | ORAL_TABLET | ORAL | Status: DC

## 2011-09-05 MED ORDER — METFORMIN HCL ER 500 MG PO TB24: 500.0000 mg | ORAL_TABLET | Freq: Four times a day (QID) | ORAL | Status: DC

## 2011-09-05 MED ORDER — VALSARTAN 160 MG PO TABS: 160.0000 mg | ORAL_TABLET | Freq: Every day | ORAL | Status: DC

## 2011-09-05 MED ORDER — SITAGLIPTIN PHOSPHATE 100 MG PO TABS: 100.0000 mg | ORAL_TABLET | Freq: Every day | ORAL | Status: DC

## 2011-09-05 MED ORDER — ESOMEPRAZOLE MAGNESIUM 40 MG PO CPDR: 40.0000 mg | DELAYED_RELEASE_CAPSULE | Freq: Every day | ORAL | Status: DC

## 2011-09-05 NOTE — Telephone Encounter (Signed)
Pt now needs all refills to go to [facility] on [address] because she now has insurance and can no longer use the [facility]. She prefers 90 day supply if possible. Pt informed.

## 2011-09-17 ENCOUNTER — Other Ambulatory Visit: Payer: Self-pay | Admitting: Endocrinology

## 2011-09-17 DIAGNOSIS — Z1231 Encounter for screening mammogram for malignant neoplasm of breast: Secondary | ICD-10-CM

## 2011-09-19 ENCOUNTER — Other Ambulatory Visit: Payer: Self-pay | Admitting: *Deleted

## 2011-09-19 MED ORDER — SITAGLIPTIN PHOSPHATE 100 MG PO TABS: 100.0000 mg | ORAL_TABLET | Freq: Every day | ORAL | Status: DC

## 2011-09-19 NOTE — Telephone Encounter (Signed)
R'cd fax from [facility] Department for refill of Januvia.

## 2011-09-27 ENCOUNTER — Ambulatory Visit (INDEPENDENT_AMBULATORY_CARE_PROVIDER_SITE_OTHER): Payer: BC Managed Care – PPO | Admitting: General Surgery

## 2011-10-03 ENCOUNTER — Ambulatory Visit (HOSPITAL_COMMUNITY)
Admission: RE | Admit: 2011-10-03 | Discharge: 2011-10-03 | Disposition: A | Discharge status: Home / Self Care | Payer: BC Managed Care – PPO | Source: Ambulatory Visit | Attending: Endocrinology | Admitting: Endocrinology

## 2011-10-03 DIAGNOSIS — Z1231 Encounter for screening mammogram for malignant neoplasm of breast: Secondary | ICD-10-CM | POA: Insufficient documentation

## 2011-10-22 ENCOUNTER — Other Ambulatory Visit: Payer: Self-pay

## 2011-10-22 MED ORDER — TRAZODONE HCL 100 MG PO TABS: 100.0000 mg | ORAL_TABLET | Freq: Every day | ORAL | Status: DC

## 2011-11-08 ENCOUNTER — Telehealth: Payer: Self-pay

## 2011-11-08 MED ORDER — NAPROXEN 375 MG PO TABS: 375.0000 mg | ORAL_TABLET | Freq: Two times a day (BID) | ORAL | Status: DC

## 2011-11-08 MED ORDER — OMEPRAZOLE 40 MG PO CPDR: 40.0000 mg | DELAYED_RELEASE_CAPSULE | Freq: Every day | ORAL | Status: DC

## 2011-11-08 NOTE — Telephone Encounter (Signed)
sent 

## 2011-11-08 NOTE — Telephone Encounter (Signed)
i sent rx

## 2011-11-08 NOTE — Telephone Encounter (Signed)
Pt called requesting Rx for Naproxen for pain, please advise.

## 2011-11-08 NOTE — Telephone Encounter (Signed)
Pt informed rx sent, pt would also like an alternative for Nexium because it is too expensive.

## 2011-11-08 NOTE — Telephone Encounter (Signed)
Pt informed of new rx via VM and to callback office with any questions/concerns.

## 2011-11-26 ENCOUNTER — Telehealth: Payer: Self-pay | Admitting: Endocrinology

## 2011-11-26 NOTE — Telephone Encounter (Signed)
Left Message for Pt to return call.

## 2011-11-26 NOTE — Telephone Encounter (Signed)
-  XR is better tolerated.  Please refill prn

## 2011-11-26 NOTE — Telephone Encounter (Signed)
Call from pharmacy, Rx escribed 09-05-11, and she was filling it today and realized it was written for the XR for the Metformin and they have been doing the regular.  Do you want her to switch over to XR or stay with regular.  Please advise before she refills today

## 2011-11-27 ENCOUNTER — Encounter: Payer: Self-pay | Admitting: Endocrinology

## 2011-11-27 ENCOUNTER — Ambulatory Visit (INDEPENDENT_AMBULATORY_CARE_PROVIDER_SITE_OTHER): Payer: BC Managed Care – PPO | Admitting: Endocrinology

## 2011-11-27 ENCOUNTER — Other Ambulatory Visit: Payer: Self-pay | Admitting: Endocrinology

## 2011-11-27 VITALS — BP 124/80 | Temp 98.1°F | Wt 233.0 lb

## 2011-11-27 DIAGNOSIS — E119 Type 2 diabetes mellitus without complications: Secondary | ICD-10-CM

## 2011-11-27 NOTE — Telephone Encounter (Signed)
Pt seen today in office.

## 2011-11-27 NOTE — Progress Notes (Signed)
Subjective:    Patient ID: Suzanne Duke, female    DOB: 1960/08/20, 51 y.o.   MRN: [id number]  HPI Pt returns for f/u of type 2 DM (dx'ed 1601; no known complications).  She had bariatric surgery (gastric bypass), in 2008.  No weight change.  pt states she feels well in general. She had cpx at health dept in may, 2013.  no cbg record, but states cbg's are in the 100's.     Past Medical History  Diagnosis Date  . ANEMIA-IRON DEFICIENCY 02/17/2007  . ANXIETY 02/17/2007  . DEPRESSION 02/17/2007  . DM 07/17/2007  . Edema 07/21/2008  . GERD 05/11/2010  . GOITER, NONTOXIC MULTINODULAR 01/07/2007  . HYPERTENSION 09/21/2006  . LUMBAR RADICULOPATHY 01/13/2007  . UNSPECIFIED NEURALGIA NEURITIS AND RADICULITIS 01/07/2007  . Morbid obesity     Past Surgical History  Procedure Date  . Cholecystectomy 1992  . Treatment fistula anal 1993  . Gastric bypass open 2008  . Electrocardiogram 08/22/2005  . Incision and drainage perirectal abscess 1989    s/p    History   Social History  . Marital Status: Married    Spouse Name: N/A    Number of Children: N/A  . Years of Education: N/A   Occupational History  . Not on file.   Social History Main Topics  . Smoking status: Current Every Day Smoker  . Smokeless tobacco: Not on file  . Alcohol Use: Yes  . Drug Use: Not on file  . Sexually Active: Not on file   Other Topics Concern  . Not on file   Social History Narrative  . No narrative on file    Current Outpatient Prescriptions on File Prior to Visit  Medication Sig Dispense Refill  . calcium carbonate (OS-CAL) 600 MG TABS Take 600 mg by mouth 2 (two) times daily.      . Ferrous Sulfate (IRON) 325 (65 FE) MG TABS Take 1 tablet by mouth 2 (two) times daily.       Marland Kitchen glipiZIDE (GLUCOTROL XL) 2.5 MG 24 hr tablet Take 1 tablet (2.5 mg total) by mouth every morning.  90 tablet  1  . Glucose Blood (ASCENSIA CONTOUR TEST VI) Use 4 times a day      . medroxyPROGESTERone (DEPO-PROVERA) 150  MG/ML injection Inject 150 mg into the muscle every 3 (three) months.      . metFORMIN (GLUCOPHAGE-XR) 500 MG 24 hr tablet Take 1 tablet (500 mg total) by mouth 4 (four) times daily.  360 tablet  1  . Multiple Vitamin (MULTIVITAMIN) tablet Take 1 tablet by mouth 2 (two) times daily.      . naproxen (NAPROSYN) 375 MG tablet Take 1 tablet (375 mg total) by mouth 2 (two) times daily with a meal.  60 tablet  11  . omeprazole (PRILOSEC) 40 MG capsule Take 1 capsule (40 mg total) by mouth daily.  30 capsule  11  . sertraline (ZOLOFT) 100 MG tablet Take 1 tablet (100 mg total) by mouth daily.  90 tablet  1  . sitaGLIPtin (JANUVIA) 100 MG tablet Take 1 tablet (100 mg total) by mouth daily.  90 tablet  1  . traZODone (DESYREL) 100 MG tablet Take 1 tablet (100 mg total) by mouth at bedtime. Take 2 tablets by mouth at bedtime  60 tablet  2  . valsartan (DIOVAN) 160 MG tablet Take 1 tablet (160 mg total) by mouth daily.  90 tablet  1  . vitamin B-12 (CYANOCOBALAMIN) 1000 MCG  tablet Take 1,000 mcg by mouth daily.        Allergies  Allergen Reactions  . Sulfonamide Derivatives     Family History  Problem Relation Age of Onset  . Cancer Mother     Breast Cancer  . Cancer Sister 38    Colon Cancer    BP 124/80  Temp 98.1 F (36.7 C) (Oral)  Wt 233 lb (105.688 kg)  Review of Systems denies hypoglycemia.      Objective:   Physical Exam VITAL SIGNS:  See vs page GENERAL: no distress Pulses: dorsalis pedis intact bilat.   Feet: no deformity.  no ulcer on the feet.  feet are of normal color and temp.  no edema.  There is bilateral onychomycosis Neuro: sensation is intact to touch on the feet.      Assessment & Plan:  DM, uncertain control.  We'll add parlodel if a1c is high.

## 2011-11-27 NOTE — Patient Instructions (Addendum)
blood tests are being requested for you today.  You will be contacted with results. check your blood sugar once a day.  vary the time of day when you check, between before the 3 meals, and at bedtime.  also check if you have symptoms of your blood sugar being too high or too low.  please keep a record of the readings and bring it to your next appointment here.  please call us sooner if your blood sugar goes below 70, or if you have a lot of readings over 200.  Please come back for a follow-up appointment in 3 months.

## 2011-11-28 LAB — HEMOGLOBIN A1C: Hgb A1c MFr Bld: 7.5 % — ABNORMAL HIGH (ref <5.7)

## 2011-12-21 ENCOUNTER — Other Ambulatory Visit: Payer: Self-pay | Admitting: General Practice

## 2011-12-21 MED ORDER — SITAGLIPTIN PHOSPHATE 100 MG PO TABS: 100.0000 mg | ORAL_TABLET | Freq: Every day | ORAL | Status: DC

## 2012-02-04 ENCOUNTER — Encounter (INDEPENDENT_AMBULATORY_CARE_PROVIDER_SITE_OTHER): Payer: BC Managed Care – PPO | Admitting: Ophthalmology

## 2012-02-06 ENCOUNTER — Encounter (INDEPENDENT_AMBULATORY_CARE_PROVIDER_SITE_OTHER): Payer: BC Managed Care – PPO | Admitting: Ophthalmology

## 2012-02-06 DIAGNOSIS — H2510 Age-related nuclear cataract, unspecified eye: Secondary | ICD-10-CM

## 2012-02-06 DIAGNOSIS — E11319 Type 2 diabetes mellitus with unspecified diabetic retinopathy without macular edema: Secondary | ICD-10-CM

## 2012-02-06 DIAGNOSIS — H43819 Vitreous degeneration, unspecified eye: Secondary | ICD-10-CM

## 2012-02-06 DIAGNOSIS — I10 Essential (primary) hypertension: Secondary | ICD-10-CM

## 2012-02-06 DIAGNOSIS — H35039 Hypertensive retinopathy, unspecified eye: Secondary | ICD-10-CM

## 2012-03-03 ENCOUNTER — Ambulatory Visit: Payer: BC Managed Care – PPO | Admitting: Endocrinology

## 2012-03-10 ENCOUNTER — Other Ambulatory Visit: Payer: Self-pay | Admitting: *Deleted

## 2012-03-10 ENCOUNTER — Other Ambulatory Visit: Payer: Self-pay

## 2012-03-10 MED ORDER — SERTRALINE HCL 100 MG PO TABS: 100.0000 mg | ORAL_TABLET | Freq: Every day | ORAL | Status: DC

## 2012-03-10 MED ORDER — METFORMIN HCL ER 500 MG PO TB24: 500.0000 mg | ORAL_TABLET | Freq: Four times a day (QID) | ORAL | Status: DC

## 2012-03-10 NOTE — Telephone Encounter (Signed)
Ov is due 

## 2012-03-27 ENCOUNTER — Other Ambulatory Visit: Payer: Self-pay

## 2012-03-27 ENCOUNTER — Telehealth: Payer: Self-pay | Admitting: Endocrinology

## 2012-03-27 MED ORDER — GLIPIZIDE ER 2.5 MG PO TB24: 2.5000 mg | ORAL_TABLET | ORAL | Status: DC

## 2012-03-27 NOTE — Telephone Encounter (Signed)
Ov is due 

## 2012-04-07 ENCOUNTER — Ambulatory Visit: Payer: BC Managed Care – PPO | Admitting: Endocrinology

## 2012-04-08 ENCOUNTER — Ambulatory Visit (INDEPENDENT_AMBULATORY_CARE_PROVIDER_SITE_OTHER): Payer: BC Managed Care – PPO | Admitting: Endocrinology

## 2012-04-08 ENCOUNTER — Encounter: Payer: Self-pay | Admitting: Endocrinology

## 2012-04-08 VITALS — BP 120/80 | HR 90 | Wt 246.0 lb

## 2012-04-08 DIAGNOSIS — E119 Type 2 diabetes mellitus without complications: Secondary | ICD-10-CM

## 2012-04-08 DIAGNOSIS — R109 Unspecified abdominal pain: Secondary | ICD-10-CM | POA: Insufficient documentation

## 2012-04-08 LAB — URINALYSIS, ROUTINE W REFLEX MICROSCOPIC
Bilirubin Urine: NEGATIVE
Hgb urine dipstick: NEGATIVE
Leukocytes, UA: NEGATIVE
Nitrite: POSITIVE
Urobilinogen, UA: 0.2 (ref 0.0–1.0)

## 2012-04-08 LAB — HEMOGLOBIN A1C: Hgb A1c MFr Bld: 10 % — ABNORMAL HIGH (ref 4.6–6.5)

## 2012-04-08 MED ORDER — GLUCOSE BLOOD VI STRP: 1.0000 | ORAL_STRIP | Freq: Every day | Status: DC

## 2012-04-08 MED ORDER — NAPROXEN 500 MG PO TABS: 500.0000 mg | ORAL_TABLET | Freq: Two times a day (BID) | ORAL | Status: DC

## 2012-04-08 MED ORDER — LOSARTAN POTASSIUM 50 MG PO TABS: 50.0000 mg | ORAL_TABLET | Freq: Every day | ORAL | Status: DC

## 2012-04-08 NOTE — Patient Instructions (Addendum)
Blood and urine tests are being requested for you today.  We'll contact you with results. Based on the results, we can add bromocriptine and/or change glipizide to nateglinide.   Please come back for a follow-up appointment in 3 months. check your blood sugar once a day.  vary the time of day when you check, between before the 3 meals, and at bedtime.  also check if you have symptoms of your blood sugar being too high or too low.  please keep a record of the readings and bring it to your next appointment here.  please call us sooner if your blood sugar goes below 70, or if you have a lot of readings over 200.  i have sent a prescription to your pharmacy, to increase the naproxen, and to change the diovan to a cheaper med.

## 2012-04-08 NOTE — Progress Notes (Signed)
Subjective:     Patient ID: Suzanne Duke, female   DOB: 1960-04-11, 52 y.o.   MRN: [id number]  HPI Pt returns for f/u of type 2 DM (dx'ed 4888; no known complications; she took insulin until she had bariatric surgery (gastric bypass) in 2008).  pt states she feels well in general, except for weight gain. She had cpx at health dept in may, 2013.  no cbg record, but states cbg's are in the 200's.   Pt states few mos of slight pain at the right flank, but no assoc dysuria.  Arthralgias persist despite naprosyn.  Past Medical History  Diagnosis Date  . ANEMIA-IRON DEFICIENCY 02/17/2007  . ANXIETY 02/17/2007  . DEPRESSION 02/17/2007  . DM 07/17/2007  . Edema 07/21/2008  . GERD 05/11/2010  . GOITER, NONTOXIC MULTINODULAR 01/07/2007  . HYPERTENSION 09/21/2006  . LUMBAR RADICULOPATHY 01/13/2007  . UNSPECIFIED NEURALGIA NEURITIS AND RADICULITIS 01/07/2007  . Morbid obesity     Past Surgical History  Procedure Laterality Date  . Cholecystectomy  1992  . Treatment fistula anal  1993  . Gastric bypass open  2008  . Electrocardiogram  08/22/2005  . Incision and drainage perirectal abscess  1989    s/p    History   Social History  . Marital Status: Married    Spouse Name: N/A    Number of Children: N/A  . Years of Education: N/A   Occupational History  . Not on file.   Social History Main Topics  . Smoking status: Current Every Day Smoker  . Smokeless tobacco: Not on file  . Alcohol Use: Yes  . Drug Use: Not on file  . Sexually Active: Not on file   Other Topics Concern  . Not on file   Social History Narrative  . No narrative on file    Current Outpatient Prescriptions on File Prior to Visit  Medication Sig Dispense Refill  . calcium carbonate (OS-CAL) 600 MG TABS Take 600 mg by mouth 2 (two) times daily.      . Ferrous Sulfate (IRON) 325 (65 FE) MG TABS Take 1 tablet by mouth 2 (two) times daily.       Marland Kitchen glipiZIDE (GLUCOTROL XL) 2.5 MG 24 hr tablet Take 1 tablet (2.5 mg  total) by mouth every morning.  90 tablet  1  . medroxyPROGESTERone (DEPO-PROVERA) 150 MG/ML injection Inject 150 mg into the muscle every 3 (three) months.      . metFORMIN (GLUCOPHAGE-XR) 500 MG 24 hr tablet Take 1 tablet (500 mg total) by mouth 4 (four) times daily.  120 tablet  1  . Multiple Vitamin (MULTIVITAMIN) tablet Take 1 tablet by mouth 2 (two) times daily.      Marland Kitchen omeprazole (PRILOSEC) 40 MG capsule Take 1 capsule (40 mg total) by mouth daily.  30 capsule  11  . sertraline (ZOLOFT) 100 MG tablet Take 1 tablet (100 mg total) by mouth daily.  90 tablet  1  . sitaGLIPtin (JANUVIA) 100 MG tablet Take 1 tablet (100 mg total) by mouth daily.  90 tablet  1  . traZODone (DESYREL) 100 MG tablet Take 1 tablet (100 mg total) by mouth at bedtime. Take 2 tablets by mouth at bedtime  60 tablet  2  . vitamin B-12 (CYANOCOBALAMIN) 1000 MCG tablet Take 1,000 mcg by mouth daily.       No current facility-administered medications on file prior to visit.    Allergies  Allergen Reactions  . Sulfonamide Derivatives  Family History  Problem Relation Age of Onset  . Cancer Mother     Breast Cancer  . Cancer Sister 20    Colon Cancer    BP 120/80  Pulse 90  Wt 246 lb (111.585 kg)  BMI 37.41 kg/m2  SpO2 98%   Review of Systems denies hypoglycemia and hematuria    Objective:   Physical Exam VITAL SIGNS:  See vs page GENERAL: no distress.  Obese Right flank: nontender Pulses: dorsalis pedis intact bilat.   Feet: no deformity.  no ulcer on the feet.  feet are of normal color and temp.  no edema Neuro: sensation is intact to touch on the feet.     Lab Results  Component Value Date   HGBA1C 7.5* 11/27/2011      Assessment:         Plan:    DM: control is apparently worse OA: pain needs increased rx Flank pain, new, uncertain etiology

## 2012-04-09 ENCOUNTER — Other Ambulatory Visit: Payer: Self-pay | Admitting: Endocrinology

## 2012-04-09 MED ORDER — GLIPIZIDE ER 5 MG PO TB24: 5.0000 mg | ORAL_TABLET | Freq: Every day | ORAL | Status: DC

## 2012-04-09 MED ORDER — BROMOCRIPTINE MESYLATE 2.5 MG PO TABS
2.5000 mg | ORAL_TABLET | Freq: Every day | ORAL | Status: DC
Start: 2012-04-09 — End: 2012-10-20

## 2012-04-12 ENCOUNTER — Other Ambulatory Visit: Payer: Self-pay

## 2012-05-15 ENCOUNTER — Other Ambulatory Visit: Payer: Self-pay

## 2012-05-15 MED ORDER — METFORMIN HCL ER 500 MG PO TB24: 500.0000 mg | ORAL_TABLET | Freq: Four times a day (QID) | ORAL | Status: DC

## 2012-06-14 ENCOUNTER — Ambulatory Visit: Payer: BC Managed Care – PPO

## 2012-06-14 ENCOUNTER — Ambulatory Visit (INDEPENDENT_AMBULATORY_CARE_PROVIDER_SITE_OTHER): Payer: BC Managed Care – PPO | Admitting: Physician Assistant

## 2012-06-14 VITALS — BP 129/81 | HR 71 | Temp 98.2°F | Resp 16 | Ht 66.5 in | Wt 240.8 lb

## 2012-06-14 DIAGNOSIS — S7001XA Contusion of right hip, initial encounter: Secondary | ICD-10-CM

## 2012-06-14 DIAGNOSIS — M25551 Pain in right hip: Secondary | ICD-10-CM

## 2012-06-14 DIAGNOSIS — M25559 Pain in unspecified hip: Secondary | ICD-10-CM

## 2012-06-14 DIAGNOSIS — S7000XA Contusion of unspecified hip, initial encounter: Secondary | ICD-10-CM

## 2012-06-14 MED ORDER — HYDROCODONE-ACETAMINOPHEN 7.5-325 MG PO TABS: 1.0000 | ORAL_TABLET | Freq: Four times a day (QID) | ORAL | Status: DC | PRN

## 2012-06-14 MED ORDER — MELOXICAM 15 MG PO TABS: 15.0000 mg | ORAL_TABLET | Freq: Every day | ORAL | Status: DC

## 2012-06-14 NOTE — Progress Notes (Signed)
  Subjective:    Patient ID: Suzanne Duke, female    DOB: [birth year], 52 y.o.   MRN: [id number]  HPI This 52 y.o. female presents for evaluation of RIGHT hip pain after a fall a week ago.  She missed a step, landed on the RIGHT hip and she fell down 4 cement steps. The pain radiates to the RIGHT leg, down to the ankle. The fall occurred as they were leaving for a trip to [place], [place], and she was unable to explore the historic city due  To increased pain with walking.  OTC products, and prescription Naproxyn, without benefit. Increased pain with weight bearing, walking, RIGHT side-lying, prolonged sitting.  Current allergies and medications reviewed.  Past medical, social and family histories reviewed.    Review of Systems As above.    Objective:   Physical Exam Blood pressure 129/81, pulse 71, temperature 98.2 F (36.8 C), temperature source Oral, resp. rate 16, height 5' 6.5" (1.689 m), weight 240 lb 12.8 oz (109.226 kg), SpO2 100.00%. Body mass index is 38.29 kg/(m^2). Well-developed, well nourished, obese BF who is awake, alert and oriented, in NAD. HEENT: [place]/AT, sclera and conjunctiva are clear.   Lungs: normal effort Extremities: no cyanosis, clubbing or edema. Tenderness in the hip region, with point of maximum tenderness over the greater trochanter.  ROM is limited due to pain, though she appears to have better ROM moving about the room than on exam.  Gait with mild limp. Skin: warm and dry without rash. No contusion noted. Psychologic: good mood and appropriate affect, normal speech and behavior.  [facility] reading (PRIMARY) by  Dr. Tamala Julian. No acute boney deformity.       Assessment & Plan:  Hip pain, acute, right/Contusion, hip, right, initial encounter - Plan: DG Hip Complete Right, meloxicam (MOBIC) 15 MG tablet, HYDROcodone-acetaminophen (NORCO) 7.5-325 MG per tablet  RTC 1 week if symptoms persist.  Fara Chute, PA-C Physician Assistant-Certified [facility]

## 2012-06-14 NOTE — Patient Instructions (Signed)
Stop the naproxen while you take the meloxicam.

## 2012-06-16 ENCOUNTER — Telehealth: Payer: Self-pay | Admitting: Endocrinology

## 2012-06-16 DIAGNOSIS — IMO0002 Reserved for concepts with insufficient information to code with codable children: Secondary | ICD-10-CM

## 2012-06-16 NOTE — Telephone Encounter (Signed)
Seen at urgent care over the w/e for leg and hip pain. Says we have referred her out to another provider for this before. She would like to be referred back to that provider again. Pt is unsure of the provider's name CB# [phone] / Sherri S.

## 2012-06-17 ENCOUNTER — Telehealth: Payer: Self-pay | Admitting: Endocrinology

## 2012-06-17 NOTE — Telephone Encounter (Signed)
done

## 2012-06-27 ENCOUNTER — Other Ambulatory Visit: Payer: Self-pay | Admitting: Radiology

## 2012-06-27 DIAGNOSIS — M25551 Pain in right hip: Secondary | ICD-10-CM

## 2012-06-27 NOTE — Telephone Encounter (Signed)
LAQUESTA FROM [facility] IS REQUESTING A REFILL ON NORCO 7.5 325MGS.  SHE DIDN'T GET IT FROM THEM SO THEY COULDN'T SURESCRIPT. PLEASE CALL [phone]     [facility] AT [phone]

## 2012-06-27 NOTE — Telephone Encounter (Signed)
Please advise on fax from [address] Hydrocodone requested.

## 2012-07-01 ENCOUNTER — Telehealth: Payer: Self-pay

## 2012-07-01 NOTE — Telephone Encounter (Signed)
Called her to advise to return to clinic and she state she does have appt with Dr Marcial Pacas tomorrow. She is advised he may be able to take this over for her tomorrow, if she needs it today, she should return to clinic, to you [facility]

## 2012-07-01 NOTE — Telephone Encounter (Signed)
PT STATES SHE HAVE BEEN TRYING TO GET HER VICODIN REFILLED AND THE PHARMACY HAVE BEEN TRYING ALL WEEK TO GET A RESPONSE FROM [place] PLEASE CALL [phone]   [facility] ON [address]

## 2012-07-09 ENCOUNTER — Encounter: Payer: Self-pay | Admitting: Endocrinology

## 2012-07-09 ENCOUNTER — Ambulatory Visit (INDEPENDENT_AMBULATORY_CARE_PROVIDER_SITE_OTHER): Payer: BC Managed Care – PPO | Admitting: Endocrinology

## 2012-07-09 ENCOUNTER — Telehealth: Payer: Self-pay | Admitting: Endocrinology

## 2012-07-09 VITALS — BP 126/80 | HR 110 | Ht 67.0 in | Wt 229.0 lb

## 2012-07-09 DIAGNOSIS — E119 Type 2 diabetes mellitus without complications: Secondary | ICD-10-CM

## 2012-07-09 MED ORDER — ONDANSETRON HCL 4 MG PO TABS: 4.0000 mg | ORAL_TABLET | Freq: Three times a day (TID) | ORAL | Status: DC | PRN

## 2012-07-09 MED ORDER — INSULIN LISPRO 100 UNIT/ML (KWIKPEN): 20.0000 [IU] | PEN_INJECTOR | Freq: Three times a day (TID) | SUBCUTANEOUS | Status: DC

## 2012-07-09 NOTE — Progress Notes (Signed)
Subjective:    Patient ID: Suzanne Duke, female    DOB: 08/22/1960, 52 y.o.   MRN: [id number]  HPI Pt returns for f/u of type 2 DM (dx'ed 5747; no known complications; she took insulin until she had bariatric surgery (gastric bypass) in 2008).  pt states she feels well in general, except for weight gain. no cbg record, but states cbg's are in the 100's.  It is in general higher as the day goes on.  She has lost a few lbs, due to her efforts.   She is soon to have an MRI for her radicular sxs.  She was rx'ed vicodin, but this causes n/v (exac by bariatric surg state).   She did cpx at health dept in may, 2014 Past Medical History  Diagnosis Date  . ANEMIA-IRON DEFICIENCY 02/17/2007  . ANXIETY 02/17/2007  . DEPRESSION 02/17/2007  . DM 07/17/2007  . Edema 07/21/2008  . GERD 05/11/2010  . GOITER, NONTOXIC MULTINODULAR 01/07/2007  . HYPERTENSION 09/21/2006  . LUMBAR RADICULOPATHY 01/13/2007  . UNSPECIFIED NEURALGIA NEURITIS AND RADICULITIS 01/07/2007  . Morbid obesity     Past Surgical History  Procedure Laterality Date  . Cholecystectomy  1992  . Treatment fistula anal  1993  . Gastric bypass open  2008  . Electrocardiogram  08/22/2005  . Incision and drainage perirectal abscess  1989    s/p    History   Social History  . Marital Status: Married    Spouse Name: N/A    Number of Children: N/A  . Years of Education: N/A   Occupational History  . Not on file.   Social History Main Topics  . Smoking status: Current Every Day Smoker -- 0.50 packs/day    Types: Cigarettes  . Smokeless tobacco: Not on file  . Alcohol Use: No  . Drug Use: No  . Sexually Active: Yes    Birth Control/ Protection: Injection   Other Topics Concern  . Not on file   Social History Narrative  . No narrative on file    Current Outpatient Prescriptions on File Prior to Visit  Medication Sig Dispense Refill  . bromocriptine (PARLODEL) 2.5 MG tablet Take 1 tablet (2.5 mg total) by mouth at  bedtime.  30 tablet  11  . calcium carbonate (OS-CAL) 600 MG TABS Take 600 mg by mouth 2 (two) times daily.      Marland Kitchen DIOVAN 160 MG tablet       . Ferrous Sulfate (IRON) 325 (65 FE) MG TABS Take 1 tablet by mouth 2 (two) times daily.       Marland Kitchen glipiZIDE (GLUCOTROL XL) 5 MG 24 hr tablet Take 1 tablet (5 mg total) by mouth daily.  30 tablet  11  . glucose blood test strip 1 each by Other route daily. And lancets 1/day  50 each  11  . HYDROcodone-acetaminophen (NORCO) 7.5-325 MG per tablet Take 1 tablet by mouth every 6 (six) hours as needed for pain.  30 tablet  0  . losartan (COZAAR) 50 MG tablet Take 1 tablet (50 mg total) by mouth daily.  30 tablet  11  . medroxyPROGESTERone (DEPO-PROVERA) 150 MG/ML injection Inject 150 mg into the muscle every 3 (three) months.      . meloxicam (MOBIC) 15 MG tablet Take 1 tablet (15 mg total) by mouth daily.  30 tablet  0  . metFORMIN (GLUCOPHAGE-XR) 500 MG 24 hr tablet Take 1 tablet (500 mg total) by mouth 4 (four) times daily.  [place]  tablet  1  . Multiple Vitamin (MULTIVITAMIN) tablet Take 1 tablet by mouth 2 (two) times daily.      . naproxen (NAPROSYN) 500 MG tablet Take 1 tablet (500 mg total) by mouth 2 (two) times daily with a meal.  60 tablet  11  . omeprazole (PRILOSEC) 40 MG capsule Take 1 capsule (40 mg total) by mouth daily.  30 capsule  11  . sertraline (ZOLOFT) 100 MG tablet Take 1 tablet (100 mg total) by mouth daily.  90 tablet  1  . sitaGLIPtin (JANUVIA) 100 MG tablet Take 1 tablet (100 mg total) by mouth daily.  90 tablet  1  . traZODone (DESYREL) 100 MG tablet Take 1 tablet (100 mg total) by mouth at bedtime. Take 2 tablets by mouth at bedtime  60 tablet  2  . vitamin B-12 (CYANOCOBALAMIN) 1000 MCG tablet Take 1,000 mcg by mouth daily.       No current facility-administered medications on file prior to visit.    Allergies  Allergen Reactions  . Sulfonamide Derivatives     Family History  Problem Relation Age of Onset  . Cancer Mother      Breast Cancer  . Diabetes Mother   . Cancer Sister 38    Colon Cancer  . Hypertension Father     BP 126/80  Pulse 110  Ht 5' 7"  (1.702 m)  Wt 229 lb (103.874 kg)  BMI 35.86 kg/m2  SpO2 96%   Review of Systems denies hypoglycemia    Objective:   Physical Exam VITAL SIGNS:  See vs page GENERAL: no distress   Lab Results  Component Value Date   HGBA1C 11.7* 07/09/2012      Assessment & Plan:  DM: worse.  She needs to resume insulin N/v, due to pain meds

## 2012-07-09 NOTE — Patient Instructions (Addendum)
Blood tests are being requested for you today.  We'll contact you with results. Based on the results, you may need to resume the insulin. i have sent a prescription to your pharmacy, for anti-nausea medication.  Please come back for a follow-up appointment in 3 months.  check your blood sugar once a day.  vary the time of day when you check, between before the 3 meals, and at bedtime.  also check if you have symptoms of your blood sugar being too high or too low.  please keep a record of the readings and bring it to your next appointment here.  please call us sooner if your blood sugar goes below 70, or if you have a lot of readings over 200.

## 2012-07-09 NOTE — Telephone Encounter (Signed)
Rec'd from [place] 2 pages to Dr. Loanne Drilling

## 2012-08-20 ENCOUNTER — Other Ambulatory Visit: Payer: Self-pay

## 2012-08-20 MED ORDER — SERTRALINE HCL 100 MG PO TABS: 100.0000 mg | ORAL_TABLET | Freq: Every day | ORAL | Status: DC

## 2012-10-10 ENCOUNTER — Ambulatory Visit: Payer: BC Managed Care – PPO | Admitting: Endocrinology

## 2012-10-20 ENCOUNTER — Ambulatory Visit (INDEPENDENT_AMBULATORY_CARE_PROVIDER_SITE_OTHER): Payer: BC Managed Care – PPO | Admitting: Endocrinology

## 2012-10-20 ENCOUNTER — Encounter: Payer: Self-pay | Admitting: Endocrinology

## 2012-10-20 VITALS — BP 138/80 | HR 74 | Wt 253.0 lb

## 2012-10-20 DIAGNOSIS — M25551 Pain in right hip: Secondary | ICD-10-CM

## 2012-10-20 DIAGNOSIS — E119 Type 2 diabetes mellitus without complications: Secondary | ICD-10-CM

## 2012-10-20 DIAGNOSIS — D509 Iron deficiency anemia, unspecified: Secondary | ICD-10-CM

## 2012-10-20 DIAGNOSIS — E042 Nontoxic multinodular goiter: Secondary | ICD-10-CM

## 2012-10-20 DIAGNOSIS — Z9884 Bariatric surgery status: Secondary | ICD-10-CM

## 2012-10-20 DIAGNOSIS — M25559 Pain in unspecified hip: Secondary | ICD-10-CM

## 2012-10-20 DIAGNOSIS — Z79899 Other long term (current) drug therapy: Secondary | ICD-10-CM

## 2012-10-20 DIAGNOSIS — I10 Essential (primary) hypertension: Secondary | ICD-10-CM

## 2012-10-20 MED ORDER — HYDROCODONE-ACETAMINOPHEN 7.5-325 MG PO TABS: 1.0000 | ORAL_TABLET | Freq: Four times a day (QID) | ORAL | Status: DC | PRN

## 2012-10-20 NOTE — Progress Notes (Signed)
Subjective:    Patient ID: Suzanne Duke, female    DOB: Aug 30, 1960, 52 y.o.   MRN: [id number]  HPI Pt returns for f/u of type 2 DM (dx'ed 7782; no known complications; she took insulin until she had bariatric surgery (gastric bypass) in 2008).  Since back on the humalog, cbg's have been low only once a month.  Both episodes were at hs.  no cbg record, but states cbg's are highest in am (but variable: 70-300).   She did cpx at health dept in may, 2014 Pt states few mos of pain rad from the lower back to the lateral aspect of the right leg, and assoc numbness.  Ortho told her it was not surgical.      Past Medical History  Diagnosis Date  . ANEMIA-IRON DEFICIENCY 02/17/2007  . ANXIETY 02/17/2007  . DEPRESSION 02/17/2007  . DM 07/17/2007  . Edema 07/21/2008  . GERD 05/11/2010  . GOITER, NONTOXIC MULTINODULAR 01/07/2007  . HYPERTENSION 09/21/2006  . LUMBAR RADICULOPATHY 01/13/2007  . UNSPECIFIED NEURALGIA NEURITIS AND RADICULITIS 01/07/2007  . Morbid obesity     Past Surgical History  Procedure Laterality Date  . Cholecystectomy  1992  . Treatment fistula anal  1993  . Gastric bypass open  2008  . Electrocardiogram  08/22/2005  . Incision and drainage perirectal abscess  1989    s/p    History   Social History  . Marital Status: Married    Spouse Name: N/A    Number of Children: N/A  . Years of Education: N/A   Occupational History  . Not on file.   Social History Main Topics  . Smoking status: Current Every Day Smoker -- 0.50 packs/day    Types: Cigarettes  . Smokeless tobacco: Not on file  . Alcohol Use: No  . Drug Use: No  . Sexual Activity: Yes    Birth Control/ Protection: Injection   Other Topics Concern  . Not on file   Social History Narrative  . No narrative on file    Current Outpatient Prescriptions on File Prior to Visit  Medication Sig Dispense Refill  . calcium carbonate (OS-CAL) 600 MG TABS Take 600 mg by mouth 2 (two) times daily.      .  Ferrous Sulfate (IRON) 325 (65 FE) MG TABS Take 1 tablet by mouth 2 (two) times daily.       Marland Kitchen glucose blood test strip 1 each by Other route daily. And lancets 1/day  50 each  11  . insulin lispro (HUMALOG KWIKPEN) 100 unit/mL SOLN Inject 20 Units into the skin 3 (three) times daily with meals. And pen needles 3/day  30 mL  11  . losartan (COZAAR) 50 MG tablet Take 1 tablet (50 mg total) by mouth daily.  30 tablet  11  . medroxyPROGESTERone (DEPO-PROVERA) 150 MG/ML injection Inject 150 mg into the muscle every 3 (three) months.      . Multiple Vitamin (MULTIVITAMIN) tablet Take 1 tablet by mouth 2 (two) times daily.      . naproxen (NAPROSYN) 500 MG tablet Take 1 tablet (500 mg total) by mouth 2 (two) times daily with a meal.  60 tablet  11  . omeprazole (PRILOSEC) 40 MG capsule Take 1 capsule (40 mg total) by mouth daily.  30 capsule  11  . sertraline (ZOLOFT) 100 MG tablet Take 1 tablet (100 mg total) by mouth daily.  90 tablet  1  . traZODone (DESYREL) 100 MG tablet Take 1 tablet (100  mg total) by mouth at bedtime. Take 2 tablets by mouth at bedtime  60 tablet  2  . vitamin B-12 (CYANOCOBALAMIN) 1000 MCG tablet Take 1,000 mcg by mouth daily.       No current facility-administered medications on file prior to visit.    Allergies  Allergen Reactions  . Sulfonamide Derivatives     Family History  Problem Relation Age of Onset  . Cancer Mother     Breast Cancer  . Diabetes Mother   . Cancer Sister 52    Colon Cancer  . Hypertension Father     BP 138/80  Pulse 74  Wt 253 lb (114.76 kg)  BMI 39.62 kg/m2  SpO2 98%  Review of Systems Denies LOC and weight change    Objective:   Physical Exam VITAL SIGNS:  See vs page GENERAL: no distress SKIN:  Insulin injection sites at the anterior abdomen are normal.    Lab Results  Component Value Date   WBC 6.9 10/20/2012   HGB 13.3 10/20/2012   HCT 40.4 10/20/2012   PLT 237.0 10/20/2012   GLUCOSE 113* 10/20/2012   CHOL 171 10/20/2012    TRIG 90.0 10/20/2012   HDL 53.80 10/20/2012   LDLCALC 99 10/20/2012   ALT 49* 10/20/2012   AST 28 10/20/2012   NA 140 10/20/2012   K 3.9 10/20/2012   CL 110 10/20/2012   CREATININE 0.8 10/20/2012   BUN 15 10/20/2012   CO2 24 10/20/2012   TSH 0.40 10/20/2012   HGBA1C 10.8* 10/20/2012   MICROALBUR 0.4 10/20/2012      Assessment & Plan:  DM: she needs increased rx.  This insulin regimen was chosen from multiple options, as it best matches her insulin to her changing requirements throughout the day.  The benefits of glycemic control must be weighed against the risks of hypoglycemia.   elev hepatic transaminases.  We'll follow. Radicular pain, persistent.  We'll rx symptomatically

## 2012-10-20 NOTE — Patient Instructions (Addendum)
Blood tests are being requested for you today.  We'll contact you with results.  Please come back for a follow-up appointment in 3 months.  check your blood sugar once a day.  vary the time of day when you check, between before the 3 meals, and at bedtime.  also check if you have symptoms of your blood sugar being too high or too low.  please keep a record of the readings and bring it to your next appointment here.  please call us sooner if your blood sugar goes below 70, or if you have a lot of readings over 200.  Here is a refill of the pain pill.

## 2012-10-21 LAB — CBC WITH DIFFERENTIAL/PLATELET
Basophils Absolute: 0 10*3/uL (ref 0.0–0.1)
Eosinophils Absolute: 0.1 10*3/uL (ref 0.0–0.7)
Hemoglobin: 13.3 g/dL (ref 12.0–15.0)
Lymphocytes Relative: 33.1 % (ref 12.0–46.0)
MCHC: 33 g/dL (ref 30.0–36.0)
Neutro Abs: 4.2 10*3/uL (ref 1.4–7.7)
Neutrophils Relative %: 60.3 % (ref 43.0–77.0)
Platelets: 237 10*3/uL (ref 150.0–400.0)
RDW: 12.9 % (ref 11.5–14.6)

## 2012-10-21 LAB — URINALYSIS, ROUTINE W REFLEX MICROSCOPIC
Hgb urine dipstick: NEGATIVE
RBC / HPF: NONE SEEN (ref 0–?)
Specific Gravity, Urine: >=1.03 (ref 1.000–1.030)
Total Protein, Urine: NEGATIVE
Urine Glucose: >=1000

## 2012-10-21 LAB — BASIC METABOLIC PANEL
Chloride: 110 mEq/L (ref 96–112)
Creatinine, Ser: 0.8 mg/dL (ref 0.4–1.2)
Potassium: 3.9 mEq/L (ref 3.5–5.1)
Sodium: 140 mEq/L (ref 135–145)

## 2012-10-21 LAB — HEPATIC FUNCTION PANEL
ALT: 49 U/L — ABNORMAL HIGH (ref 0–35)
AST: 28 U/L (ref 0–37)
Alkaline Phosphatase: 121 U/L — ABNORMAL HIGH (ref 39–117)
Bilirubin, Direct: 0 mg/dL (ref 0.0–0.3)
Total Protein: 7.1 g/dL (ref 6.0–8.3)

## 2012-10-21 LAB — IBC PANEL
Iron: 48 ug/dL (ref 42–145)
Saturation Ratios: 17 % — ABNORMAL LOW (ref 20.0–50.0)

## 2012-10-21 LAB — TSH: TSH: 0.4 u[IU]/mL (ref 0.35–5.50)

## 2012-10-21 LAB — MICROALBUMIN / CREATININE URINE RATIO
Creatinine,U: 161.9 mg/dL
Microalb Creat Ratio: 0.2 mg/g (ref 0.0–30.0)
Microalb, Ur: 0.4 mg/dL (ref 0.0–1.9)

## 2012-10-21 LAB — HEMOGLOBIN A1C: Hgb A1c MFr Bld: 10.8 % — ABNORMAL HIGH (ref 4.6–6.5)

## 2012-10-21 MED ORDER — INSULIN GLARGINE 100 UNIT/ML SOLOSTAR PEN: 20.0000 [IU] | PEN_INJECTOR | Freq: Every day | SUBCUTANEOUS | Status: DC

## 2012-10-29 ENCOUNTER — Telehealth: Payer: Self-pay | Admitting: Endocrinology

## 2012-10-29 NOTE — Telephone Encounter (Signed)
PT advised and states an understanding

## 2012-10-29 NOTE — Telephone Encounter (Signed)
Here is what my chart is telling us i wrote: Your blood sugar is high. i have sent a prescription to your pharmacy, to add lantus at bedtime. Your liver is just a little off, but is improved. We'll recheck when you return. Please come back for a follow-up appointment in 3 months

## 2012-11-03 ENCOUNTER — Telehealth: Payer: Self-pay | Admitting: Endocrinology

## 2012-11-03 NOTE — Telephone Encounter (Signed)
i can change to NPH, but this is only cheaper if you draw it up.  Is that ok with you?

## 2012-11-04 MED ORDER — INSULIN NPH (HUMAN) (ISOPHANE) 100 UNIT/ML ~~LOC~~ SUSP: 15.0000 [IU] | Freq: Every day | SUBCUTANEOUS | Status: DC

## 2012-11-04 NOTE — Telephone Encounter (Signed)
Left message

## 2012-11-04 NOTE — Telephone Encounter (Signed)
Pt left voicemail stating she would like to try nph insulin

## 2012-11-04 NOTE — Telephone Encounter (Signed)
Ok, i have sent a prescription to your pharmacy for safety reasons, it only 15 units at first

## 2012-11-27 ENCOUNTER — Other Ambulatory Visit: Payer: Self-pay

## 2012-11-27 MED ORDER — OMEPRAZOLE 40 MG PO CPDR: 40.0000 mg | DELAYED_RELEASE_CAPSULE | Freq: Every day | ORAL | Status: DC

## 2012-12-02 ENCOUNTER — Other Ambulatory Visit: Payer: Self-pay | Admitting: Endocrinology

## 2012-12-02 DIAGNOSIS — Z1231 Encounter for screening mammogram for malignant neoplasm of breast: Secondary | ICD-10-CM

## 2012-12-09 ENCOUNTER — Ambulatory Visit (HOSPITAL_COMMUNITY)
Admission: RE | Admit: 2012-12-09 | Discharge: 2012-12-09 | Disposition: A | Discharge status: Home / Self Care | Payer: BC Managed Care – PPO | Source: Ambulatory Visit | Attending: Endocrinology | Admitting: Endocrinology

## 2012-12-09 DIAGNOSIS — Z1231 Encounter for screening mammogram for malignant neoplasm of breast: Secondary | ICD-10-CM | POA: Insufficient documentation

## 2013-01-01 ENCOUNTER — Other Ambulatory Visit: Payer: Self-pay

## 2013-01-19 ENCOUNTER — Ambulatory Visit (INDEPENDENT_AMBULATORY_CARE_PROVIDER_SITE_OTHER): Payer: BC Managed Care – PPO | Admitting: Endocrinology

## 2013-01-19 ENCOUNTER — Encounter: Payer: Self-pay | Admitting: Endocrinology

## 2013-01-19 VITALS — BP 124/60 | HR 80 | Temp 98.3°F | Resp 16 | Ht 67.0 in | Wt 260.7 lb

## 2013-01-19 DIAGNOSIS — Z23 Encounter for immunization: Secondary | ICD-10-CM

## 2013-01-19 DIAGNOSIS — E119 Type 2 diabetes mellitus without complications: Secondary | ICD-10-CM

## 2013-01-19 MED ORDER — INSULIN ASPART PROT & ASPART (70-30 MIX) 100 UNIT/ML PEN: PEN_INJECTOR | SUBCUTANEOUS | Status: DC

## 2013-01-19 NOTE — Patient Instructions (Addendum)
Blood tests are being requested for you today.  We'll contact you with results.  Please come back for a follow-up appointment in 3 months.  check your blood sugar once a day.  vary the time of day when you check, between before the 3 meals, and at bedtime.  also check if you have symptoms of your blood sugar being too high or too low.  please keep a record of the readings and bring it to your next appointment here.  please call us sooner if your blood sugar goes below 70, or if you have a lot of readings over 200.  Please change both current insulins to "novolog 70/30 pen."  i have sent a prescription to your pharmacy.

## 2013-01-19 NOTE — Progress Notes (Signed)
Subjective:    Patient ID: Suzanne Duke, female    DOB: 08/09/1960, 52 y.o.   MRN: [id number]  HPI Pt returns for f/u of type 2 DM (dx'ed 1992, during a pregnancy, but it persisted after the pregnancy; no known complications; she took insulin until she had bariatric surgery (gastric bypass) in 2008).  She was then controlled with orals until mid-2014, when she needed to resume the insulin.  pt says NPH pen is too expensive for her.  She wants to go back to 70/30.  She did cpx at health dept in may, 2014.   Past Medical History  Diagnosis Date  . ANEMIA-IRON DEFICIENCY 02/17/2007  . ANXIETY 02/17/2007  . DEPRESSION 02/17/2007  . DM 07/17/2007  . Edema 07/21/2008  . GERD 05/11/2010  . GOITER, NONTOXIC MULTINODULAR 01/07/2007  . HYPERTENSION 09/21/2006  . LUMBAR RADICULOPATHY 01/13/2007  . UNSPECIFIED NEURALGIA NEURITIS AND RADICULITIS 01/07/2007  . Morbid obesity     Past Surgical History  Procedure Laterality Date  . Cholecystectomy  1992  . Treatment fistula anal  1993  . Gastric bypass open  2008  . Electrocardiogram  08/22/2005  . Incision and drainage perirectal abscess  1989    s/p    History   Social History  . Marital Status: Married    Spouse Name: N/A    Number of Children: N/A  . Years of Education: N/A   Occupational History  . Not on file.   Social History Main Topics  . Smoking status: Current Every Day Smoker -- 0.50 packs/day    Types: Cigarettes  . Smokeless tobacco: Not on file  . Alcohol Use: No  . Drug Use: No  . Sexual Activity: Yes    Birth Control/ Protection: Injection   Other Topics Concern  . Not on file   Social History Narrative  . No narrative on file    Current Outpatient Prescriptions on File Prior to Visit  Medication Sig Dispense Refill  . calcium carbonate (OS-CAL) 600 MG TABS Take 600 mg by mouth 2 (two) times daily.      . Ferrous Sulfate (IRON) 325 (65 FE) MG TABS Take 1 tablet by mouth 2 (two) times daily.       Marland Kitchen glucose  blood test strip 1 each by Other route daily. And lancets 1/day  50 each  11  . HYDROcodone-acetaminophen (NORCO) 7.5-325 MG per tablet Take 1 tablet by mouth every 6 (six) hours as needed for pain.  50 tablet  1  . losartan (COZAAR) 50 MG tablet Take 1 tablet (50 mg total) by mouth daily.  30 tablet  11  . medroxyPROGESTERone (DEPO-PROVERA) 150 MG/ML injection Inject 150 mg into the muscle every 3 (three) months.      . Multiple Vitamin (MULTIVITAMIN) tablet Take 1 tablet by mouth 2 (two) times daily.      . naproxen (NAPROSYN) 500 MG tablet Take 1 tablet (500 mg total) by mouth 2 (two) times daily with a meal.  60 tablet  11  . omeprazole (PRILOSEC) 40 MG capsule Take 1 capsule (40 mg total) by mouth daily.  30 capsule  11  . sertraline (ZOLOFT) 100 MG tablet Take 1 tablet (100 mg total) by mouth daily.  90 tablet  1  . traZODone (DESYREL) 100 MG tablet Take 1 tablet (100 mg total) by mouth at bedtime. Take 2 tablets by mouth at bedtime  60 tablet  2  . vitamin B-12 (CYANOCOBALAMIN) 1000 MCG tablet Take 1,000 mcg  by mouth daily.       No current facility-administered medications on file prior to visit.    Allergies  Allergen Reactions  . Sulfonamide Derivatives     Family History  Problem Relation Age of Onset  . Cancer Mother     Breast Cancer  . Diabetes Mother   . Cancer Sister 16    Colon Cancer  . Hypertension Father     BP 124/60  Pulse 80  Temp(Src) 98.3 F (36.8 C) (Oral)  Resp 16  Ht 5' 7"  (1.702 m)  Wt 260 lb 11.2 oz (118.253 kg)  BMI 40.82 kg/m2  Review of Systems She is having mild hypoglycemia, at hs, due to the fact that her supper is often small.  Denies weight change.      Objective:   Physical Exam VITAL SIGNS:  See vs page.   GENERAL: no distress.  Lab Results  Component Value Date   HGBA1C 10.7* 01/19/2013      Assessment & Plan:  DM: she needs increased rx.  This insulin regimen was chosen from multiple options, as it best matches her insulin  to her changing requirements throughout the day.  The benefits of glycemic control must be weighed against the risks of hypoglycemia.   Bariatric surgery status.  She has regained some of the weight she has lost, but she is still far below her pre-surgery weight.

## 2013-01-20 LAB — HEMOGLOBIN A1C: Hgb A1c MFr Bld: 10.7 % — ABNORMAL HIGH (ref 4.6–6.5)

## 2013-02-05 ENCOUNTER — Other Ambulatory Visit: Payer: Self-pay

## 2013-02-05 MED ORDER — INSULIN ASPART PROT & ASPART (70-30 MIX) 100 UNIT/ML PEN: PEN_INJECTOR | SUBCUTANEOUS | Status: DC

## 2013-02-09 ENCOUNTER — Ambulatory Visit (INDEPENDENT_AMBULATORY_CARE_PROVIDER_SITE_OTHER): Payer: Self-pay | Admitting: Ophthalmology

## 2013-02-09 ENCOUNTER — Ambulatory Visit (INDEPENDENT_AMBULATORY_CARE_PROVIDER_SITE_OTHER): Payer: BC Managed Care – PPO | Admitting: Ophthalmology

## 2013-04-08 ENCOUNTER — Other Ambulatory Visit: Payer: Self-pay | Admitting: Endocrinology

## 2013-04-13 ENCOUNTER — Ambulatory Visit (INDEPENDENT_AMBULATORY_CARE_PROVIDER_SITE_OTHER): Payer: BC Managed Care – PPO

## 2013-04-13 DIAGNOSIS — Z111 Encounter for screening for respiratory tuberculosis: Secondary | ICD-10-CM

## 2013-04-15 ENCOUNTER — Other Ambulatory Visit: Payer: Self-pay

## 2013-04-15 MED ORDER — LOSARTAN POTASSIUM 50 MG PO TABS: 50.0000 mg | ORAL_TABLET | Freq: Every day | ORAL | Status: DC

## 2013-04-16 LAB — TB SKIN TEST: TB Skin Test: NEGATIVE

## 2013-05-11 ENCOUNTER — Ambulatory Visit: Payer: BC Managed Care – PPO | Admitting: Endocrinology

## 2013-05-18 ENCOUNTER — Ambulatory Visit (INDEPENDENT_AMBULATORY_CARE_PROVIDER_SITE_OTHER): Payer: BC Managed Care – PPO | Admitting: Endocrinology

## 2013-05-18 ENCOUNTER — Encounter: Payer: Self-pay | Admitting: Endocrinology

## 2013-05-18 VITALS — BP 130/62 | HR 90 | Temp 98.7°F | Ht 67.0 in | Wt 270.0 lb

## 2013-05-18 DIAGNOSIS — E119 Type 2 diabetes mellitus without complications: Secondary | ICD-10-CM

## 2013-05-18 NOTE — Progress Notes (Signed)
Subjective:    Patient ID: Suzanne Duke, female    DOB: 06/29/1960, 53 y.o.   MRN: [id number]  HPI Pt returns for f/u of type 2 DM (dx'ed 1992, during a pregnancy, but it persisted after the pregnancy; she has mild if any neuropathy of the lower extremities; no known associated chronic complications; she took insulin until she had bariatric surgery (gastric bypass) in 2008; she was then controlled with orals until mid-2014, when she needed to resume the insulin).  She [facility] in may, 2014.  no cbg record, but states cbg's are mildly low approx twice a month.  This usually happens in the early hrs of the morning.  She says cbg's vary from 200-350.  It is in general higher as the day goes on.   Past Medical History  Diagnosis Date  . ANEMIA-IRON DEFICIENCY 02/17/2007  . ANXIETY 02/17/2007  . DEPRESSION 02/17/2007  . DM 07/17/2007  . Edema 07/21/2008  . GERD 05/11/2010  . GOITER, NONTOXIC MULTINODULAR 01/07/2007  . HYPERTENSION 09/21/2006  . LUMBAR RADICULOPATHY 01/13/2007  . UNSPECIFIED NEURALGIA NEURITIS AND RADICULITIS 01/07/2007  . Morbid obesity     Past Surgical History  Procedure Laterality Date  . Cholecystectomy  1992  . Treatment fistula anal  1993  . Gastric bypass open  2008  . Electrocardiogram  08/22/2005  . Incision and drainage perirectal abscess  1989    s/p    History   Social History  . Marital Status: Married    Spouse Name: N/A    Number of Children: N/A  . Years of Education: N/A   Occupational History  . Not on file.   Social History Main Topics  . Smoking status: Current Every Day Smoker -- 0.50 packs/day    Types: Cigarettes  . Smokeless tobacco: Not on file  . Alcohol Use: No  . Drug Use: No  . Sexual Activity: Yes    Birth Control/ Protection: Injection   Other Topics Concern  . Not on file   Social History Narrative  . No narrative on file    Current Outpatient Prescriptions on File Prior to Visit  Medication Sig  Dispense Refill  . calcium carbonate (OS-CAL) 600 MG TABS Take 600 mg by mouth 2 (two) times daily.      . Ferrous Sulfate (IRON) 325 (65 FE) MG TABS Take 1 tablet by mouth 2 (two) times daily.       Marland Kitchen glucose blood test strip 1 each by Other route daily. And lancets 1/day  50 each  11  . HYDROcodone-acetaminophen (NORCO) 7.5-325 MG per tablet Take 1 tablet by mouth every 6 (six) hours as needed for pain.  50 tablet  1  . Insulin Aspart Prot & Aspart (NOVOLOG MIX 70/30 FLEXPEN) (70-30) 100 UNIT/ML SUPN 50 units with breakfast, and 15 units with the evening meal, and pen needles 2/day  20 pen  6  . losartan (COZAAR) 50 MG tablet Take 1 tablet (50 mg total) by mouth daily.  30 tablet  11  . medroxyPROGESTERone (DEPO-PROVERA) 150 MG/ML injection Inject 150 mg into the muscle every 3 (three) months.      . Multiple Vitamin (MULTIVITAMIN) tablet Take 1 tablet by mouth 2 (two) times daily.      . naproxen (NAPROSYN) 500 MG tablet Take 1 tablet (500 mg total) by mouth 2 (two) times daily with a meal.  60 tablet  11  . omeprazole (PRILOSEC) 40 MG capsule Take 1 capsule (  40 mg total) by mouth daily.  30 capsule  11  . sertraline (ZOLOFT) 100 MG tablet TAKE 1 TABLET BY MOUTH DAILY.  90 tablet  1  . traZODone (DESYREL) 100 MG tablet Take 1 tablet (100 mg total) by mouth at bedtime. Take 2 tablets by mouth at bedtime  60 tablet  2  . vitamin B-12 (CYANOCOBALAMIN) 1000 MCG tablet Take 1,000 mcg by mouth daily.       No current facility-administered medications on file prior to visit.    Allergies  Allergen Reactions  . Sulfonamide Derivatives     Family History  Problem Relation Age of Onset  . Cancer Mother     Breast Cancer  . Diabetes Mother   . Cancer Sister 22    Colon Cancer  . Hypertension Father     BP 130/62  Pulse 90  Temp(Src) 98.7 F (37.1 C) (Oral)  Ht 5' 7"  (1.702 m)  Wt 270 lb (122.471 kg)  BMI 42.28 kg/m2  SpO2 98%  Review of Systems Denies LOC.  She has gained weight.     Objective:   Physical Exam VITAL SIGNS:  See vs page GENERAL: no distress.  Morbid obesity.   i reviewed electrocardiogram: normal in my opinion.    Assessment & Plan:  DM: she needs increased rx.   Bariatric surgery status.  She has regained some of the weight she has lost, but she is still far below her pre-surgery weight.

## 2013-05-18 NOTE — Patient Instructions (Addendum)
Blood tests are being requested for you today.  We'll contact you with results.  Please come back for a follow-up appointment in 3 months.  check your blood sugar once a day.  vary the time of day when you check, between before the 3 meals, and at bedtime.  also check if you have symptoms of your blood sugar being too high or too low.  please keep a record of the readings and bring it to your next appointment here.  please call us sooner if your blood sugar goes below 70, or if you have a lot of readings over 200.

## 2013-05-19 LAB — HEMOGLOBIN A1C: Hgb A1c MFr Bld: 9.6 % — ABNORMAL HIGH (ref 4.6–6.5)

## 2013-05-27 ENCOUNTER — Other Ambulatory Visit: Payer: Self-pay

## 2013-05-27 ENCOUNTER — Telehealth: Payer: Self-pay | Admitting: Endocrinology

## 2013-05-27 MED ORDER — INSULIN ASPART PROT & ASPART (70-30 MIX) 100 UNIT/ML PEN: PEN_INJECTOR | SUBCUTANEOUS | Status: DC

## 2013-05-27 NOTE — Telephone Encounter (Signed)
Pt states her computer is down and Dr. Loanne Drilling had sent her a chart message  The patient would like to know what that message was   Call back: [phone]   Please advise   Thank You :)

## 2013-05-27 NOTE — Telephone Encounter (Signed)
Pt informed that [employer]. Insulin script changed.

## 2013-06-08 NOTE — Telephone Encounter (Signed)
error 

## 2013-08-24 ENCOUNTER — Encounter: Payer: Self-pay | Admitting: Endocrinology

## 2013-08-24 ENCOUNTER — Other Ambulatory Visit: Payer: Self-pay

## 2013-08-24 ENCOUNTER — Ambulatory Visit (INDEPENDENT_AMBULATORY_CARE_PROVIDER_SITE_OTHER): Payer: BC Managed Care – PPO | Admitting: Endocrinology

## 2013-08-24 VITALS — BP 118/60 | HR 98 | Temp 98.7°F | Ht 67.0 in | Wt 275.0 lb

## 2013-08-24 DIAGNOSIS — E119 Type 2 diabetes mellitus without complications: Secondary | ICD-10-CM

## 2013-08-24 LAB — HEMOGLOBIN A1C: HEMOGLOBIN A1C: 9.2 % — AB (ref 4.6–6.5)

## 2013-08-24 MED ORDER — INSULIN GLARGINE 100 UNIT/ML SOLOSTAR PEN: 100.0000 [IU] | PEN_INJECTOR | SUBCUTANEOUS | Status: DC

## 2013-08-24 NOTE — Progress Notes (Signed)
Subjective:    Patient ID: Suzanne Duke, female    DOB: [birth year], 53 y.o.   MRN: [id number]  HPI Pt returns for f/u of type 2 DM (dx'ed 1992, during a pregnancy, but it persisted after the pregnancy; she has mild if any neuropathy of the lower extremities; no known associated chronic complications; she took insulin until she had bariatric surgery (gastric bypass) in 2008; she was then controlled with orals until mid-2014, when she needed to resume the insulin; she has never had pancreatitis, severe hypoglycemia, or DKA).  She [facility] in June of 2015. no cbg record, but states cbg's are mildly low approx once a week, usually in the afternoon.  It is highest (200's) in am.  She wants to change to qd insulin.  pt states she feels well in general.   Past Medical History  Diagnosis Date  . ANEMIA-IRON DEFICIENCY 02/17/2007  . ANXIETY 02/17/2007  . DEPRESSION 02/17/2007  . DM 07/17/2007  . Edema 07/21/2008  . GERD 05/11/2010  . GOITER, NONTOXIC MULTINODULAR 01/07/2007  . HYPERTENSION 09/21/2006  . LUMBAR RADICULOPATHY 01/13/2007  . UNSPECIFIED NEURALGIA NEURITIS AND RADICULITIS 01/07/2007  . Morbid obesity     Past Surgical History  Procedure Laterality Date  . Cholecystectomy  1992  . Treatment fistula anal  1993  . Gastric bypass open  2008  . Electrocardiogram  08/22/2005  . Incision and drainage perirectal abscess  1989    s/p    History   Social History  . Marital Status: Married    Spouse Name: N/A    Number of Children: N/A  . Years of Education: N/A   Occupational History  . Not on file.   Social History Main Topics  . Smoking status: Current Every Day Smoker -- 0.50 packs/day    Types: Cigarettes  . Smokeless tobacco: Not on file  . Alcohol Use: No  . Drug Use: No  . Sexual Activity: Yes    Birth Control/ Protection: Injection   Other Topics Concern  . Not on file   Social History Narrative  . No narrative on file    Current Outpatient  Prescriptions on File Prior to Visit  Medication Sig Dispense Refill  . calcium carbonate (OS-CAL) 600 MG TABS Take 600 mg by mouth 2 (two) times daily.      . Ferrous Sulfate (IRON) 325 (65 FE) MG TABS Take 1 tablet by mouth 2 (two) times daily.       Marland Kitchen glucose blood test strip 1 each by Other route daily. And lancets 1/day  50 each  11  . HYDROcodone-acetaminophen (NORCO) 7.5-325 MG per tablet Take 1 tablet by mouth every 6 (six) hours as needed for pain.  50 tablet  1  . losartan (COZAAR) 50 MG tablet Take 1 tablet (50 mg total) by mouth daily.  30 tablet  11  . medroxyPROGESTERone (DEPO-PROVERA) 150 MG/ML injection Inject 150 mg into the muscle every 3 (three) months.      . Multiple Vitamin (MULTIVITAMIN) tablet Take 1 tablet by mouth 2 (two) times daily.      . naproxen (NAPROSYN) 500 MG tablet Take 1 tablet (500 mg total) by mouth 2 (two) times daily with a meal.  60 tablet  11  . omeprazole (PRILOSEC) 40 MG capsule Take 1 capsule (40 mg total) by mouth daily.  30 capsule  11  . sertraline (ZOLOFT) 100 MG tablet TAKE 1 TABLET BY MOUTH DAILY.  90 tablet  1  . traZODone (DESYREL) 100 MG tablet Take 1 tablet (100 mg total) by mouth at bedtime. Take 2 tablets by mouth at bedtime  60 tablet  2  . vitamin B-12 (CYANOCOBALAMIN) 1000 MCG tablet Take 1,000 mcg by mouth daily.       No current facility-administered medications on file prior to visit.    Allergies  Allergen Reactions  . Sulfonamide Derivatives     Family History  Problem Relation Age of Onset  . Cancer Mother     Breast Cancer  . Diabetes Mother   . Cancer Sister 30    Colon Cancer  . Hypertension Father     BP 118/60  Pulse 98  Temp(Src) 98.7 F (37.1 C) (Oral)  Ht 5' 7"  (1.702 m)  Wt 275 lb (124.739 kg)  BMI 43.06 kg/m2  SpO2 97%    Review of Systems She denies LOC and weight change.      Objective:   Physical Exam VITAL SIGNS:  See vs page GENERAL: no distress Pulses: dorsalis pedis intact bilat.     Feet: no deformity. normal color and temp.  no edema Skin:  no ulcer on the feet.   Neuro: sensation is intact to touch on the feet  Lab Results  Component Value Date   HGBA1C 9.2* 08/24/2013       Assessment & Plan:  DM: moderate exacerbation.   Noncompliance with cbg recording: I'll work around this as best I can.  She appears to need a simpler insulin regimen.   Morbid obesity: improved, but persistent after bariatric surgery.  This impairs the ability to achieve glycemic control.  I'll work around this as best I can.   Patient is advised the following: Patient Instructions  Blood tests are being requested for you today.  We'll contact you with results.   Based on the results, we'll change to a once-a-day insulin, and i'll send a prescription to your pharmacy.   Please come back for a follow-up appointment in 3 months.   check your blood sugar once a day.  vary the time of day when you check, between before the 3 meals, and at bedtime.  also check if you have symptoms of your blood sugar being too high or too low.  please keep a record of the readings and bring it to your next appointment here.  please call us sooner if your blood sugar goes below 70, or if you have a lot of readings over 200.

## 2013-08-24 NOTE — Patient Instructions (Addendum)
Blood tests are being requested for you today.  We'll contact you with results.   Based on the results, we'll change to a once-a-day insulin, and i'll send a prescription to your pharmacy.   Please come back for a follow-up appointment in 3 months.   check your blood sugar once a day.  vary the time of day when you check, between before the 3 meals, and at bedtime.  also check if you have symptoms of your blood sugar being too high or too low.  please keep a record of the readings and bring it to your next appointment here.  please call us sooner if your blood sugar goes below 70, or if you have a lot of readings over 200.

## 2013-08-31 ENCOUNTER — Telehealth: Payer: Self-pay | Admitting: Endocrinology

## 2013-08-31 NOTE — Telephone Encounter (Signed)
Called pharmacy and left voicemail to refill Lantus pen 100 units into the skin every morning with a 30 day supply. Instructed pharmacy to call back if they had any questions.

## 2013-08-31 NOTE — Telephone Encounter (Signed)
°  Patient stated she was here last week Dr prescribed her insulin for 15 units when it suppose to be 30 units. Could please let pharmacy know.

## 2013-10-14 ENCOUNTER — Other Ambulatory Visit: Payer: Self-pay | Admitting: Endocrinology

## 2013-11-14 ENCOUNTER — Other Ambulatory Visit: Payer: Self-pay | Admitting: Endocrinology

## 2013-11-16 NOTE — Telephone Encounter (Signed)
Refill x 1 Ov is due

## 2013-11-22 ENCOUNTER — Other Ambulatory Visit: Payer: Self-pay | Admitting: Endocrinology

## 2013-11-24 ENCOUNTER — Ambulatory Visit (INDEPENDENT_AMBULATORY_CARE_PROVIDER_SITE_OTHER): Payer: BC Managed Care – PPO | Admitting: Endocrinology

## 2013-11-24 ENCOUNTER — Other Ambulatory Visit: Payer: Self-pay | Admitting: Endocrinology

## 2013-11-24 ENCOUNTER — Encounter: Payer: Self-pay | Admitting: Endocrinology

## 2013-11-24 VITALS — BP 124/62 | HR 89 | Temp 99.0°F | Ht 67.0 in | Wt 305.0 lb

## 2013-11-24 DIAGNOSIS — Z23 Encounter for immunization: Secondary | ICD-10-CM

## 2013-11-24 DIAGNOSIS — D509 Iron deficiency anemia, unspecified: Secondary | ICD-10-CM

## 2013-11-24 DIAGNOSIS — E042 Nontoxic multinodular goiter: Secondary | ICD-10-CM

## 2013-11-24 DIAGNOSIS — Z0000 Encounter for general adult medical examination without abnormal findings: Secondary | ICD-10-CM

## 2013-11-24 DIAGNOSIS — E119 Type 2 diabetes mellitus without complications: Secondary | ICD-10-CM

## 2013-11-24 DIAGNOSIS — Z9884 Bariatric surgery status: Secondary | ICD-10-CM

## 2013-11-24 MED ORDER — GLUCOSE BLOOD VI STRP: 1.0000 | ORAL_STRIP | Freq: Two times a day (BID) | Status: DC

## 2013-11-24 MED ORDER — SERTRALINE HCL 50 MG PO TABS: ORAL_TABLET | ORAL | Status: DC

## 2013-11-24 NOTE — Progress Notes (Signed)
Subjective:    Patient ID: Suzanne Duke, female    DOB: 02/23/1961, 53 y.o.   MRN: [id number]  HPI Pt returns for f/u of diabetes mellitus: DM type: insulin-requiring type 2 Dx'ed:1992, during a pregnancy, but it persisted after the pregnancy Complications:  Therapy: insulin (QD at her request) DKA: never Severe hypoglycemia: never Pancreatitis: never Other: took insulin until she had bariatric surgery (gastric bypass) in 2008; she weighted 492 prior to the surgery; she was then controlled with orals until mid-2014, when she needed to resume the insulin Interval history: Chief complaint is weight gain and excessive appetite.  Her meter broke.  She says never misses the insulin. Depression persits despite zoloft. Past Medical History  Diagnosis Date  . ANEMIA-IRON DEFICIENCY 02/17/2007  . ANXIETY 02/17/2007  . DEPRESSION 02/17/2007  . DM 07/17/2007  . Edema 07/21/2008  . GERD 05/11/2010  . GOITER, NONTOXIC MULTINODULAR 01/07/2007  . HYPERTENSION 09/21/2006  . LUMBAR RADICULOPATHY 01/13/2007  . UNSPECIFIED NEURALGIA NEURITIS AND RADICULITIS 01/07/2007  . Morbid obesity     Past Surgical History  Procedure Laterality Date  . Cholecystectomy  1992  . Treatment fistula anal  1993  . Gastric bypass open  2008  . Electrocardiogram  08/22/2005  . Incision and drainage perirectal abscess  1989    s/p    History   Social History  . Marital Status: Married    Spouse Name: N/A    Number of Children: N/A  . Years of Education: N/A   Occupational History  . Not on file.   Social History Main Topics  . Smoking status: Current Every Day Smoker -- 0.50 packs/day    Types: Cigarettes  . Smokeless tobacco: Not on file  . Alcohol Use: No  . Drug Use: No  . Sexual Activity: Yes    Birth Control/ Protection: Injection   Other Topics Concern  . Not on file   Social History Narrative  . No narrative on file    Current Outpatient Prescriptions on File Prior to Visit    Medication Sig Dispense Refill  . calcium carbonate (OS-CAL) 600 MG TABS Take 600 mg by mouth 2 (two) times daily.      . Ferrous Sulfate (IRON) 325 (65 FE) MG TABS Take 1 tablet by mouth 2 (two) times daily.       Marland Kitchen glucose blood test strip 1 each by Other route daily. And lancets 1/day  50 each  11  . HYDROcodone-acetaminophen (NORCO) 7.5-325 MG per tablet Take 1 tablet by mouth every 6 (six) hours as needed for pain.  50 tablet  1  . LANTUS SOLOSTAR 100 UNIT/ML Solostar Pen INJECT 100 UNITS SUBCUTANEOUSLY EVERY MORNING  30 mL  2  . losartan (COZAAR) 50 MG tablet Take 1 tablet (50 mg total) by mouth daily.  30 tablet  11  . medroxyPROGESTERone (DEPO-PROVERA) 150 MG/ML injection Inject 150 mg into the muscle every 3 (three) months.      . Multiple Vitamin (MULTIVITAMIN) tablet Take 1 tablet by mouth 2 (two) times daily.      . naproxen (NAPROSYN) 500 MG tablet Take 1 tablet (500 mg total) by mouth 2 (two) times daily with a meal.  60 tablet  11  . omeprazole (PRILOSEC) 40 MG capsule Take 1 capsule (40 mg total) by mouth daily.  30 capsule  11  . vitamin B-12 (CYANOCOBALAMIN) 1000 MCG tablet Take 1,000 mcg by mouth daily.       No current facility-administered medications  on file prior to visit.    Allergies  Allergen Reactions  . Sulfonamide Derivatives     Family History  Problem Relation Age of Onset  . Cancer Mother     Breast Cancer  . Diabetes Mother   . Cancer Sister 37    Colon Cancer  . Hypertension Father     BP 124/62  Pulse 89  Temp(Src) 99 F (37.2 C) (Oral)  Ht 5' 7"  (1.702 m)  Wt 305 lb (138.347 kg)  BMI 47.76 kg/m2  SpO2 97%  Review of Systems She denies hypoglycemia, sob, and n/v    Objective:   Physical Exam VITAL SIGNS:  See vs page GENERAL: no distress Pulses: dorsalis pedis intact bilat.   Feet: no deformity.  no edema. Skin:  no ulcer on the feet.  normal color and temp.  Neuro: sensation is intact to touch on the feet.        Assessment &  Plan:  DM: prob poorly controlled Depression: mild exacerbation. Morbid obesity: improved, but persistent.   She wants victoza for this.    Patient is advised the following: Patient Instructions  Blood tests are being requested for you today.  We'll contact you with results.   Based on the results, we'll probably be able to add victoza, and i'll send a prescription to your pharmacy.  Please come back for a follow-up appointment in 2 months.  check your blood sugar once a day.  vary the time of day when you check, between before the 3 meals, and at bedtime.  also check if you have symptoms of your blood sugar being too high or too low.  please keep a record of the readings and bring it to your next appointment here.  please call us sooner if your blood sugar goes below 70, or if you have a lot of readings over 200.   i have sent a prescription to your pharmacy, to increase the zoloft.

## 2013-11-24 NOTE — Patient Instructions (Addendum)
Blood tests are being requested for you today.  We'll contact you with results.   Based on the results, we'll probably be able to add victoza, and i'll send a prescription to your pharmacy.  Please come back for a follow-up appointment in 2 months.  check your blood sugar once a day.  vary the time of day when you check, between before the 3 meals, and at bedtime.  also check if you have symptoms of your blood sugar being too high or too low.  please keep a record of the readings and bring it to your next appointment here.  please call us sooner if your blood sugar goes below 70, or if you have a lot of readings over 200.   i have sent a prescription to your pharmacy, to increase the zoloft.

## 2013-11-25 LAB — CBC WITH DIFFERENTIAL/PLATELET
Basophils Absolute: 0 10*3/uL (ref 0.0–0.1)
Basophils Relative: 0 % (ref 0–1)
EOS ABS: 0.1 10*3/uL (ref 0.0–0.7)
EOS PCT: 1 % (ref 0–5)
HCT: 36.1 % (ref 36.0–46.0)
HEMOGLOBIN: 11.9 g/dL — AB (ref 12.0–15.0)
LYMPHS ABS: 2.8 10*3/uL (ref 0.7–4.0)
Lymphocytes Relative: 37 % (ref 12–46)
MCH: 28.4 pg (ref 26.0–34.0)
MCHC: 33 g/dL (ref 30.0–36.0)
MCV: 86.2 fL (ref 78.0–100.0)
Monocytes Absolute: 0.5 10*3/uL (ref 0.1–1.0)
Monocytes Relative: 6 % (ref 3–12)
Neutro Abs: 4.3 10*3/uL (ref 1.7–7.7)
Neutrophils Relative %: 56 % (ref 43–77)
Platelets: 297 10*3/uL (ref 150–400)
RBC: 4.19 MIL/uL (ref 3.87–5.11)
RDW: 13.3 % (ref 11.5–15.5)
WBC: 7.6 10*3/uL (ref 4.0–10.5)

## 2013-11-25 LAB — BASIC METABOLIC PANEL
BUN: 10 mg/dL (ref 6–23)
CHLORIDE: 110 meq/L (ref 96–112)
CO2: 25 mEq/L (ref 19–32)
Calcium: 8.8 mg/dL (ref 8.4–10.5)
Creat: 0.62 mg/dL (ref 0.50–1.10)
Glucose, Bld: 32 mg/dL — CL (ref 70–99)
POTASSIUM: 4.3 meq/L (ref 3.5–5.3)
SODIUM: 143 meq/L (ref 135–145)

## 2013-11-25 LAB — LIPID PANEL
Cholesterol: 142 mg/dL (ref 0–200)
HDL: 59 mg/dL (ref >39)
LDL Cholesterol: 73 mg/dL (ref 0–99)
TRIGLYCERIDES: 52 mg/dL (ref <150)
Total CHOL/HDL Ratio: 2.4 Ratio
VLDL: 10 mg/dL (ref 0–40)

## 2013-11-25 LAB — URINALYSIS, ROUTINE W REFLEX MICROSCOPIC
Bilirubin Urine: NEGATIVE
GLUCOSE, UA: NEGATIVE mg/dL
Hgb urine dipstick: NEGATIVE
KETONES UR: NEGATIVE mg/dL
LEUKOCYTES UA: NEGATIVE
NITRITE: POSITIVE — AB
PH: 6 (ref 5.0–8.0)
Protein, ur: NEGATIVE mg/dL
Specific Gravity, Urine: 1.019 (ref 1.005–1.030)
Urobilinogen, UA: 0.2 mg/dL (ref 0.0–1.0)

## 2013-11-25 LAB — HEPATIC FUNCTION PANEL
ALBUMIN: 3.4 g/dL — AB (ref 3.5–5.2)
ALK PHOS: 129 U/L — AB (ref 39–117)
ALT: 37 U/L — ABNORMAL HIGH (ref 0–35)
AST: 27 U/L (ref 0–37)
BILIRUBIN DIRECT: 0.1 mg/dL (ref 0.0–0.3)
BILIRUBIN INDIRECT: 0.2 mg/dL (ref 0.2–1.2)
Total Bilirubin: 0.3 mg/dL (ref 0.2–1.2)
Total Protein: 6.1 g/dL (ref 6.0–8.3)

## 2013-11-25 LAB — HEMOGLOBIN A1C
Hgb A1c MFr Bld: 6.6 % — ABNORMAL HIGH (ref <5.7)
Mean Plasma Glucose: 143 mg/dL — ABNORMAL HIGH (ref <117)

## 2013-11-25 LAB — MICROALBUMIN / CREATININE URINE RATIO
Creatinine, Urine: 126.4 mg/dL
MICROALB/CREAT RATIO: 2.4 mg/g (ref 0.0–30.0)
Microalb, Ur: 0.3 mg/dL (ref <2.0)

## 2013-11-25 LAB — URINALYSIS, MICROSCOPIC ONLY
Casts: NONE SEEN
Crystals: NONE SEEN

## 2013-11-25 LAB — IRON AND TIBC
%SAT: 18 % — AB (ref 20–55)
Iron: 44 ug/dL (ref 42–145)
TIBC: 245 ug/dL — ABNORMAL LOW (ref 250–470)
UIBC: 201 ug/dL (ref 125–400)

## 2013-11-25 LAB — TSH: TSH: 1.044 u[IU]/mL (ref 0.350–4.500)

## 2013-11-25 LAB — VITAMIN B12: Vitamin B-12: 1434 pg/mL — ABNORMAL HIGH (ref 211–911)

## 2013-11-27 LAB — PTH, INTACT AND CALCIUM
Calcium: 8.8 mg/dL (ref 8.4–10.5)
PTH: 53 pg/mL (ref 14–64)

## 2013-11-28 ENCOUNTER — Other Ambulatory Visit: Payer: Self-pay | Admitting: Endocrinology

## 2013-11-28 ENCOUNTER — Encounter: Payer: Self-pay | Admitting: Endocrinology

## 2013-11-28 DIAGNOSIS — K7581 Nonalcoholic steatohepatitis (NASH): Secondary | ICD-10-CM | POA: Insufficient documentation

## 2013-11-28 MED ORDER — INSULIN GLARGINE 100 UNIT/ML SOLOSTAR PEN: 90.0000 [IU] | PEN_INJECTOR | Freq: Every day | SUBCUTANEOUS | Status: DC

## 2013-12-02 ENCOUNTER — Telehealth: Payer: Self-pay | Admitting: Endocrinology

## 2013-12-02 NOTE — Telephone Encounter (Signed)
Spoke with pt about recent lab work. Pt notified of MD's note.

## 2013-12-02 NOTE — Telephone Encounter (Signed)
Clarification on my chart message

## 2013-12-11 ENCOUNTER — Other Ambulatory Visit: Payer: Self-pay | Admitting: Endocrinology

## 2013-12-16 ENCOUNTER — Telehealth: Payer: Self-pay | Admitting: Endocrinology

## 2013-12-16 NOTE — Telephone Encounter (Signed)
please call patient: i have received a request for inhaled nebulizer medication.  Do you need this?  Who has prescribed this for you?

## 2013-12-17 NOTE — Telephone Encounter (Signed)
Requested call back to discuss.

## 2013-12-17 NOTE — Telephone Encounter (Signed)
Unable to reach pt. Will try again at a later time.

## 2013-12-18 NOTE — Telephone Encounter (Signed)
Unable to reach pt. Will try again at a later time.

## 2014-01-05 ENCOUNTER — Other Ambulatory Visit: Payer: Self-pay | Admitting: Endocrinology

## 2014-01-25 ENCOUNTER — Ambulatory Visit: Payer: BC Managed Care – PPO | Admitting: Endocrinology

## 2014-02-02 ENCOUNTER — Encounter: Payer: Self-pay | Admitting: Endocrinology

## 2014-02-02 ENCOUNTER — Ambulatory Visit (INDEPENDENT_AMBULATORY_CARE_PROVIDER_SITE_OTHER): Payer: BC Managed Care – PPO | Admitting: Endocrinology

## 2014-02-02 VITALS — BP 130/78 | HR 75 | Temp 98.7°F | Ht 67.0 in | Wt 314.0 lb

## 2014-02-02 DIAGNOSIS — K769 Liver disease, unspecified: Secondary | ICD-10-CM

## 2014-02-02 DIAGNOSIS — E103299 Type 1 diabetes mellitus with mild nonproliferative diabetic retinopathy without macular edema, unspecified eye: Secondary | ICD-10-CM

## 2014-02-02 DIAGNOSIS — Z0000 Encounter for general adult medical examination without abnormal findings: Secondary | ICD-10-CM

## 2014-02-02 DIAGNOSIS — E10329 Type 1 diabetes mellitus with mild nonproliferative diabetic retinopathy without macular edema: Secondary | ICD-10-CM

## 2014-02-02 DIAGNOSIS — E042 Nontoxic multinodular goiter: Secondary | ICD-10-CM

## 2014-02-02 LAB — HEMOGLOBIN A1C: HEMOGLOBIN A1C: 7.1 % — AB (ref 4.6–6.5)

## 2014-02-02 LAB — HEPATIC FUNCTION PANEL
ALK PHOS: 116 U/L (ref 39–117)
ALT: 26 U/L (ref 0–35)
AST: 26 U/L (ref 0–37)
Albumin: 3.2 g/dL — ABNORMAL LOW (ref 3.5–5.2)
BILIRUBIN TOTAL: 0.2 mg/dL (ref 0.2–1.2)
Bilirubin, Direct: 0 mg/dL (ref 0.0–0.3)
TOTAL PROTEIN: 6.6 g/dL (ref 6.0–8.3)

## 2014-02-02 MED ORDER — GLUCOSE BLOOD VI STRP: 1.0000 | ORAL_STRIP | Freq: Two times a day (BID) | Status: DC

## 2014-02-02 NOTE — Patient Instructions (Addendum)
Blood tests are being requested for you today.  We'll contact you with results.   Based on the results, we may be able to add victoza, and i'll send a prescription to your pharmacy.  Please come back for a follow-up appointment in 3 months.   check your blood sugar once a day.  vary the time of day when you check, between before the 3 meals, and at bedtime.  also check if you have symptoms of your blood sugar being too high or too low.  please keep a record of the readings and bring it to your next appointment here.  please call us sooner if your blood sugar goes below 70, or if you have a lot of readings over 200.  Please continue the same zoloft.

## 2014-02-02 NOTE — Progress Notes (Signed)
Subjective:    Patient ID: Suzanne Duke, female    DOB: Oct 27, 1960, 53 y.o.   MRN: [id number]  HPI Pt returns for f/u of diabetes mellitus: DM type: insulin-requiring type 2 Dx'ed:1992, during a pregnancy, but it persisted after the pregnancy Complications: non-proliferative retinopathy. Therapy: insulin (QD at her request). DKA: never Severe hypoglycemia: never Pancreatitis: never Other: took insulin until she had bariatric surgery (gastric bypass) in 2008; she weighted 492 prior to the surgery; she was then controlled with orals until mid-2014, when she needed to resume the insulin Interval history: she does not check cbg's, and she does not have a meter.  She says never misses the insulin.  pt states she feels well in general.  Depression is much better on the increased zoloft.  NASH: denies abd pain.   Past Medical History  Diagnosis Date  . ANEMIA-IRON DEFICIENCY 02/17/2007  . ANXIETY 02/17/2007  . DEPRESSION 02/17/2007  . DM 07/17/2007  . Edema 07/21/2008  . GERD 05/11/2010  . GOITER, NONTOXIC MULTINODULAR 01/07/2007  . HYPERTENSION 09/21/2006  . LUMBAR RADICULOPATHY 01/13/2007  . UNSPECIFIED NEURALGIA NEURITIS AND RADICULITIS 01/07/2007  . Morbid obesity     Past Surgical History  Procedure Laterality Date  . Cholecystectomy  1992  . Treatment fistula anal  1993  . Gastric bypass open  2008  . Electrocardiogram  08/22/2005  . Incision and drainage perirectal abscess  1989    s/p    History   Social History  . Marital Status: Married    Spouse Name: N/A    Number of Children: N/A  . Years of Education: N/A   Occupational History  . Not on file.   Social History Main Topics  . Smoking status: Current Every Day Smoker -- 0.50 packs/day    Types: Cigarettes  . Smokeless tobacco: Not on file  . Alcohol Use: No  . Drug Use: No  . Sexual Activity: Yes    Birth Control/ Protection: Injection   Other Topics Concern  . Not on file   Social History  Narrative    Current Outpatient Prescriptions on File Prior to Visit  Medication Sig Dispense Refill  . calcium carbonate (OS-CAL) 600 MG TABS Take 600 mg by mouth 2 (two) times daily.    . Ferrous Sulfate (IRON) 325 (65 FE) MG TABS Take 1 tablet by mouth 2 (two) times daily.     Marland Kitchen glucose blood (ONETOUCH VERIO) test strip 1 each by Other route 2 (two) times daily. And lancets 2/day 250.03 100 each 12  . glucose blood test strip 1 each by Other route daily. And lancets 1/day 50 each 11  . Insulin Glargine (LANTUS SOLOSTAR) 100 UNIT/ML Solostar Pen Inject 90 Units into the skin daily. 30 mL 2  . losartan (COZAAR) 50 MG tablet Take 1 tablet (50 mg total) by mouth daily. 30 tablet 11  . medroxyPROGESTERone (DEPO-PROVERA) 150 MG/ML injection Inject 150 mg into the muscle every 3 (three) months.    . Multiple Vitamin (MULTIVITAMIN) tablet Take 1 tablet by mouth 2 (two) times daily.    . naproxen (NAPROSYN) 500 MG tablet TAKE 1 TABLET BY MOUTH TWICE A DAY WITH MEALS 60 tablet 7  . omeprazole (PRILOSEC) 40 MG capsule TAKE ONE CAPSULE BY MOUTH EVERY DAY 30 capsule 8  . sertraline (ZOLOFT) 50 MG tablet 3 tabs daily 90 tablet 11  . vitamin B-12 (CYANOCOBALAMIN) 1000 MCG tablet Take 1,000 mcg by mouth daily.     No current facility-administered  medications on file prior to visit.    Allergies  Allergen Reactions  . Sulfonamide Derivatives     Family History  Problem Relation Age of Onset  . Cancer Mother     Breast Cancer  . Diabetes Mother   . Cancer Sister 15    Colon Cancer  . Hypertension Father     BP 130/78 mmHg  Pulse 75  Temp(Src) 98.7 F (37.1 C) (Oral)  Ht 5' 7"  (1.702 m)  Wt 314 lb (142.429 kg)  BMI 49.17 kg/m2  SpO2 97%    Review of Systems She denies hypoglycemia.      Objective:   Physical Exam VITAL SIGNS:  See vs page GENERAL: no distress Pulses: dorsalis pedis intact bilat.   Feet: no deformity.  no edema.  There is bilateral onychomycosis. Skin:  no  ulcer on the feet.  normal color and temp. Neuro: sensation is intact to touch on the feet    Lab Results  Component Value Date   ALT 26 02/02/2014   AST 26 02/02/2014   ALKPHOS 116 02/02/2014   BILITOT 0.2 02/02/2014   Lab Results  Component Value Date   HGBA1C 7.1* 02/02/2014      Assessment & Plan:  Depression: well-controlled NASH: much better DM: this is the best control this pt should aim for, given this regimen, which does match insulin to her changing needs throughout the day.    Patient is advised the following: Patient Instructions  Blood tests are being requested for you today.  We'll contact you with results.   Based on the results, we may be able to add victoza, and i'll send a prescription to your pharmacy.  Please come back for a follow-up appointment in 3 months.   check your blood sugar once a day.  vary the time of day when you check, between before the 3 meals, and at bedtime.  also check if you have symptoms of your blood sugar being too high or too low.  please keep a record of the readings and bring it to your next appointment here.  please call us sooner if your blood sugar goes below 70, or if you have a lot of readings over 200.  Please continue the same zoloft.

## 2014-02-03 LAB — HEPATITIS C ANTIBODY: HCV Ab: NEGATIVE

## 2014-02-03 LAB — HEPATITIS B SURFACE ANTIGEN: Hepatitis B Surface Ag: NEGATIVE

## 2014-04-07 ENCOUNTER — Other Ambulatory Visit: Payer: Self-pay | Admitting: *Deleted

## 2014-04-07 ENCOUNTER — Ambulatory Visit (INDEPENDENT_AMBULATORY_CARE_PROVIDER_SITE_OTHER): Payer: BLUE CROSS/BLUE SHIELD | Admitting: *Deleted

## 2014-04-07 ENCOUNTER — Telehealth: Payer: Self-pay | Admitting: Endocrinology

## 2014-04-07 DIAGNOSIS — Z111 Encounter for screening for respiratory tuberculosis: Secondary | ICD-10-CM | POA: Diagnosis not present

## 2014-04-07 MED ORDER — TUBERCULIN PPD 5 UNIT/0.1ML ID SOLN
5.0000 [IU] | Freq: Once | INTRADERMAL | Status: AC
  Administered 2014-04-07: 5 [IU] via INTRADERMAL

## 2014-04-07 NOTE — Telephone Encounter (Signed)
Pt calling

## 2014-04-13 ENCOUNTER — Other Ambulatory Visit: Payer: Self-pay | Admitting: Endocrinology

## 2014-04-16 ENCOUNTER — Other Ambulatory Visit: Payer: Self-pay | Admitting: Endocrinology

## 2014-05-04 ENCOUNTER — Ambulatory Visit (INDEPENDENT_AMBULATORY_CARE_PROVIDER_SITE_OTHER): Payer: BLUE CROSS/BLUE SHIELD | Admitting: Endocrinology

## 2014-05-04 ENCOUNTER — Encounter: Payer: Self-pay | Admitting: Endocrinology

## 2014-05-04 VITALS — BP 152/76 | HR 91 | Wt 306.6 lb

## 2014-05-04 DIAGNOSIS — E10329 Type 1 diabetes mellitus with mild nonproliferative diabetic retinopathy without macular edema: Secondary | ICD-10-CM | POA: Diagnosis not present

## 2014-05-04 DIAGNOSIS — E103299 Type 1 diabetes mellitus with mild nonproliferative diabetic retinopathy without macular edema, unspecified eye: Secondary | ICD-10-CM

## 2014-05-04 DIAGNOSIS — Z9884 Bariatric surgery status: Secondary | ICD-10-CM

## 2014-05-04 NOTE — Progress Notes (Signed)
Subjective:    Patient ID: Suzanne Duke, female    DOB: 12/12/1960, 54 y.o.   MRN: [id number]  HPI Pt returns for f/u of diabetes mellitus: DM type: insulin-requiring type 2 Dx'ed:1992, during a pregnancy, but it persisted after the pregnancy Complications: non-proliferative retinopathy. Therapy: lantus DKA: never Severe hypoglycemia: last episode was 2009. Pancreatitis: never Other: took insulin until she had bariatric surgery (gastric bypass) in 2008; she weighted 492 prior to the surgery; she was then controlled with orals until mid-2014, when she needed to resume the insulin; she takes qd insulin, at her request.   Interval history: pt states she feels well in general. no cbg record, but states cbg's are mildly low in the middle of the night, approx once per week.  It is highest mid-morning. Past Medical History  Diagnosis Date  . ANEMIA-IRON DEFICIENCY 02/17/2007  . ANXIETY 02/17/2007  . DEPRESSION 02/17/2007  . DM 07/17/2007  . Edema 07/21/2008  . GERD 05/11/2010  . GOITER, NONTOXIC MULTINODULAR 01/07/2007  . HYPERTENSION 09/21/2006  . LUMBAR RADICULOPATHY 01/13/2007  . UNSPECIFIED NEURALGIA NEURITIS AND RADICULITIS 01/07/2007  . Morbid obesity     Past Surgical History  Procedure Laterality Date  . Cholecystectomy  1992  . Treatment fistula anal  1993  . Gastric bypass open  2008  . Electrocardiogram  08/22/2005  . Incision and drainage perirectal abscess  1989    s/p    History   Social History  . Marital Status: Married    Spouse Name: N/A  . Number of Children: N/A  . Years of Education: N/A   Occupational History  . Not on file.   Social History Main Topics  . Smoking status: Current Every Day Smoker -- 0.50 packs/day    Types: Cigarettes  . Smokeless tobacco: Not on file  . Alcohol Use: No  . Drug Use: No  . Sexual Activity: Yes    Birth Control/ Protection: Injection   Other Topics Concern  . Not on file   Social History Narrative     Current Outpatient Prescriptions on File Prior to Visit  Medication Sig Dispense Refill  . calcium carbonate (OS-CAL) 600 MG TABS Take 600 mg by mouth 2 (two) times daily.    . Ferrous Sulfate (IRON) 325 (65 FE) MG TABS Take 1 tablet by mouth 2 (two) times daily.     Marland Kitchen glucose blood (ONETOUCH VERIO) test strip 1 each by Other route 2 (two) times daily. And lancets 2/day 250.03 100 each 12  . glucose blood (ONETOUCH VERIO) test strip 1 each by Other route 2 (two) times daily. And lancets 2/day 100 each 12  . glucose blood test strip 1 each by Other route daily. And lancets 1/day 50 each 11  . losartan (COZAAR) 50 MG tablet TAKE 1 TABLET BY MOUTH DAILY. 30 tablet 8  . medroxyPROGESTERone (DEPO-PROVERA) 150 MG/ML injection Inject 150 mg into the muscle every 3 (three) months.    . Multiple Vitamin (MULTIVITAMIN) tablet Take 1 tablet by mouth 2 (two) times daily.    . naproxen (NAPROSYN) 500 MG tablet TAKE 1 TABLET BY MOUTH TWICE A DAY WITH MEALS 60 tablet 7  . omeprazole (PRILOSEC) 40 MG capsule TAKE ONE CAPSULE BY MOUTH EVERY DAY 30 capsule 8  . sertraline (ZOLOFT) 50 MG tablet 3 tabs daily 90 tablet 11  . vitamin B-12 (CYANOCOBALAMIN) 1000 MCG tablet Take 1,000 mcg by mouth daily.     No current facility-administered medications on file prior to  visit.    Allergies  Allergen Reactions  . Sulfonamide Derivatives     Family History  Problem Relation Age of Onset  . Cancer Mother     Breast Cancer  . Diabetes Mother   . Cancer Sister 10    Colon Cancer  . Hypertension Father     BP 152/76 mmHg  Pulse 91  Wt 306 lb 9.6 oz (139.073 kg)  Review of Systems Denies LOC.  She has lost a few lbs.      Objective:   Physical Exam VITAL SIGNS:  See vs page GENERAL: no distress Pulses: dorsalis pedis intact bilat.   Feet: no deformity. no edema. There is bilateral onychomycosis.  Skin: no ulcer on the feet. normal color and temp.  Neuro: sensation is intact to touch on the  feet  Lab Results  Component Value Date   HGBA1C 7.0* 05/04/2014      Assessment & Plan:  DM: well-controlled Obesity: persistent  HTN: we'll follow on the same medication for now.   Patient is advised the following: Patient Instructions  blood tests are being requested for you today.  We'll let you know about the results.   Please see a weight loss surgery specialist.  you will receive a phone call, about a day and time for an appointment. check your blood sugar twice a day.  vary the time of day when you check, between before the 3 meals, and at bedtime.  also check if you have symptoms of your blood sugar being too high or too low.  please keep a record of the readings and bring it to your next appointment here.  You can write it on any piece of paper.  please call us sooner if your blood sugar goes below 70, or if you have a lot of readings over 200. Please come back for a follow-up appointment in 3 months   addendum: reduce the insulin to 85 units each morning

## 2014-05-04 NOTE — Patient Instructions (Addendum)
blood tests are being requested for you today.  We'll let you know about the results.   Please see a weight loss surgery specialist.  you will receive a phone call, about a day and time for an appointment. check your blood sugar twice a day.  vary the time of day when you check, between before the 3 meals, and at bedtime.  also check if you have symptoms of your blood sugar being too high or too low.  please keep a record of the readings and bring it to your next appointment here.  You can write it on any piece of paper.  please call us sooner if your blood sugar goes below 70, or if you have a lot of readings over 200. Please come back for a follow-up appointment in 3 months

## 2014-05-05 LAB — VITAMIN B12: VITAMIN B 12: 1098 pg/mL — AB (ref 211–911)

## 2014-05-05 LAB — HEMOGLOBIN A1C: HEMOGLOBIN A1C: 7 % — AB (ref 4.6–6.5)

## 2014-05-05 LAB — VITAMIN D 25 HYDROXY (VIT D DEFICIENCY, FRACTURES): VITD: 35.43 ng/mL (ref 30.00–100.00)

## 2014-05-10 ENCOUNTER — Telehealth: Payer: Self-pay | Admitting: Endocrinology

## 2014-05-10 NOTE — Telephone Encounter (Signed)
Patient would like her results please    Please advise patient   Thank you

## 2014-05-10 NOTE — Telephone Encounter (Signed)
Your blood sugar is good. Due to the mildly low blood sugar you have, please reduce the insulin to 85 units each morning. i'll see you next time.

## 2014-05-11 NOTE — Telephone Encounter (Signed)
Pt advised of note below and voiced understanding.

## 2014-08-04 ENCOUNTER — Encounter: Payer: Self-pay | Admitting: Endocrinology

## 2014-08-04 ENCOUNTER — Ambulatory Visit (INDEPENDENT_AMBULATORY_CARE_PROVIDER_SITE_OTHER): Payer: BLUE CROSS/BLUE SHIELD | Admitting: Endocrinology

## 2014-08-04 VITALS — BP 132/70 | HR 79 | Ht 67.0 in | Wt 316.0 lb

## 2014-08-04 DIAGNOSIS — E103299 Type 1 diabetes mellitus with mild nonproliferative diabetic retinopathy without macular edema, unspecified eye: Secondary | ICD-10-CM

## 2014-08-04 DIAGNOSIS — E10329 Type 1 diabetes mellitus with mild nonproliferative diabetic retinopathy without macular edema: Secondary | ICD-10-CM

## 2014-08-04 NOTE — Progress Notes (Signed)
Subjective:    Patient ID: Suzanne Duke, female    DOB: 1960/12/02, 54 y.o.   MRN: [id number]  HPI Pt returns for f/u of diabetes mellitus: DM type: insulin-requiring type 2 Dx'ed:1992, during a pregnancy, but it persisted after the pregnancy Complications: non-proliferative retinopathy. Therapy: lantus DKA: never Severe hypoglycemia: last episode was 2009. Pancreatitis: never Other: took insulin until she had bariatric surgery (gastric bypass) in 2008; she weighted 492 prior to the surgery; she was then controlled with orals until mid-2014, when she needed to resume the insulin; she takes qd insulin, at her request.   Interval history: no cbg record, but states cbg's are well-controlled. pt states she feels well in general, except for the death of her son last month.  She says zoloft helps.   Past Medical History  Diagnosis Date  . ANEMIA-IRON DEFICIENCY 02/17/2007  . ANXIETY 02/17/2007  . DEPRESSION 02/17/2007  . DM 07/17/2007  . Edema 07/21/2008  . GERD 05/11/2010  . GOITER, NONTOXIC MULTINODULAR 01/07/2007  . HYPERTENSION 09/21/2006  . LUMBAR RADICULOPATHY 01/13/2007  . UNSPECIFIED NEURALGIA NEURITIS AND RADICULITIS 01/07/2007  . Morbid obesity     Past Surgical History  Procedure Laterality Date  . Cholecystectomy  1992  . Treatment fistula anal  1993  . Gastric bypass open  2008  . Electrocardiogram  08/22/2005  . Incision and drainage perirectal abscess  1989    s/p    History   Social History  . Marital Status: Married    Spouse Name: N/A  . Number of Children: N/A  . Years of Education: N/A   Occupational History  . Not on file.   Social History Main Topics  . Smoking status: Current Every Day Smoker -- 0.50 packs/day    Types: Cigarettes  . Smokeless tobacco: Not on file  . Alcohol Use: No  . Drug Use: No  . Sexual Activity: Yes    Birth Control/ Protection: Injection   Other Topics Concern  . Not on file   Social History Narrative    Current  Outpatient Prescriptions on File Prior to Visit  Medication Sig Dispense Refill  . calcium carbonate (OS-CAL) 600 MG TABS Take 600 mg by mouth 2 (two) times daily.    . Ferrous Sulfate (IRON) 325 (65 FE) MG TABS Take 1 tablet by mouth 2 (two) times daily.     Marland Kitchen glucose blood (ONETOUCH VERIO) test strip 1 each by Other route 2 (two) times daily. And lancets 2/day 250.03 100 each 12  . glucose blood (ONETOUCH VERIO) test strip 1 each by Other route 2 (two) times daily. And lancets 2/day 100 each 12  . glucose blood test strip 1 each by Other route daily. And lancets 1/day 50 each 11  . Insulin Glargine (LANTUS) 100 UNIT/ML Solostar Pen Inject 85 Units into the skin every morning.    Marland Kitchen losartan (COZAAR) 50 MG tablet TAKE 1 TABLET BY MOUTH DAILY. 30 tablet 8  . medroxyPROGESTERone (DEPO-PROVERA) 150 MG/ML injection Inject 150 mg into the muscle every 3 (three) months.    . Multiple Vitamin (MULTIVITAMIN) tablet Take 1 tablet by mouth 2 (two) times daily.    . naproxen (NAPROSYN) 500 MG tablet TAKE 1 TABLET BY MOUTH TWICE A DAY WITH MEALS 60 tablet 7  . omeprazole (PRILOSEC) 40 MG capsule TAKE ONE CAPSULE BY MOUTH EVERY DAY 30 capsule 8  . sertraline (ZOLOFT) 50 MG tablet 3 tabs daily 90 tablet 11  . vitamin B-12 (CYANOCOBALAMIN) 1000 MCG  tablet Take 1,000 mcg by mouth daily.     No current facility-administered medications on file prior to visit.    Allergies  Allergen Reactions  . Sulfonamide Derivatives     Family History  Problem Relation Age of Onset  . Cancer Mother     Breast Cancer  . Diabetes Mother   . Cancer Sister 45    Colon Cancer  . Hypertension Father     BP 132/70 mmHg  Pulse 79  Ht 5' 7"  (1.702 m)  Wt 316 lb (143.337 kg)  BMI 49.48 kg/m2  SpO2 97%  Review of Systems She denies hypoglycemia.      Objective:   Physical Exam VITAL SIGNS:  See vs page GENERAL: no distress.  Morbid obesity Pulses: dorsalis pedis intact bilat.   MSK: no deformity of the  feet CV: no leg edema Skin:  no ulcer on the feet.  normal color and temp on the feet. Neuro: sensation is intact to touch on the feet        Assessment & Plan:  DM: apparently well-controlled  Patient is advised the following: Patient Instructions  blood tests are being requested for you today.  We'll let you know about the results.   check your blood sugar twice a day.  vary the time of day when you check, between before the 3 meals, and at bedtime.  also check if you have symptoms of your blood sugar being too high or too low.  please keep a record of the readings and bring it to your next appointment here.  You can write it on any piece of paper.  please call us sooner if your blood sugar goes below 70, or if you have a lot of readings over 200. Please come back for a regular physical appointment in 3 months.

## 2014-08-04 NOTE — Patient Instructions (Addendum)
blood tests are being requested for you today.  We'll let you know about the results.   check your blood sugar twice a day.  vary the time of day when you check, between before the 3 meals, and at bedtime.  also check if you have symptoms of your blood sugar being too high or too low.  please keep a record of the readings and bring it to your next appointment here.  You can write it on any piece of paper.  please call us sooner if your blood sugar goes below 70, or if you have a lot of readings over 200. Please come back for a regular physical appointment in 3 months.

## 2014-08-05 LAB — HEMOGLOBIN A1C: Hgb A1c MFr Bld: 7 % — ABNORMAL HIGH (ref 4.6–6.5)

## 2014-09-14 ENCOUNTER — Other Ambulatory Visit: Payer: Self-pay | Admitting: Endocrinology

## 2014-11-08 ENCOUNTER — Encounter: Payer: Self-pay | Admitting: Endocrinology

## 2014-11-08 ENCOUNTER — Ambulatory Visit (INDEPENDENT_AMBULATORY_CARE_PROVIDER_SITE_OTHER): Payer: BLUE CROSS/BLUE SHIELD | Admitting: Endocrinology

## 2014-11-08 VITALS — BP 136/88 | HR 83 | Temp 99.3°F | Ht 67.0 in | Wt 315.0 lb

## 2014-11-08 DIAGNOSIS — Z23 Encounter for immunization: Secondary | ICD-10-CM | POA: Diagnosis not present

## 2014-11-08 DIAGNOSIS — E103299 Type 1 diabetes mellitus with mild nonproliferative diabetic retinopathy without macular edema, unspecified eye: Secondary | ICD-10-CM

## 2014-11-08 DIAGNOSIS — E10329 Type 1 diabetes mellitus with mild nonproliferative diabetic retinopathy without macular edema: Secondary | ICD-10-CM

## 2014-11-08 DIAGNOSIS — E042 Nontoxic multinodular goiter: Secondary | ICD-10-CM

## 2014-11-08 DIAGNOSIS — Z0000 Encounter for general adult medical examination without abnormal findings: Secondary | ICD-10-CM

## 2014-11-08 LAB — POCT GLYCOSYLATED HEMOGLOBIN (HGB A1C): Hemoglobin A1C: 6.6

## 2014-11-08 NOTE — Progress Notes (Signed)
Subjective:    Patient ID: Suzanne Duke, female    DOB: [birth year], 54 y.o.   MRN: [id number]  HPI Pt is here for regular wellness examination, and is feeling pretty well in general, and says chronic med probs are stable, except as noted below Past Medical History  Diagnosis Date  . ANEMIA-IRON DEFICIENCY 02/17/2007  . ANXIETY 02/17/2007  . DEPRESSION 02/17/2007  . DM 07/17/2007  . Edema 07/21/2008  . GERD 05/11/2010  . GOITER, NONTOXIC MULTINODULAR 01/07/2007  . HYPERTENSION 09/21/2006  . LUMBAR RADICULOPATHY 01/13/2007  . UNSPECIFIED NEURALGIA NEURITIS AND RADICULITIS 01/07/2007  . Morbid obesity     Past Surgical History  Procedure Laterality Date  . Cholecystectomy  1992  . Treatment fistula anal  1993  . Gastric bypass open  2008  . Electrocardiogram  08/22/2005  . Incision and drainage perirectal abscess  1989    s/p    Social History   Social History  . Marital Status: Married    Spouse Name: N/A  . Number of Children: N/A  . Years of Education: N/A   Occupational History  . Not on file.   Social History Main Topics  . Smoking status: Current Every Day Smoker -- 0.50 packs/day    Types: Cigarettes  . Smokeless tobacco: Not on file  . Alcohol Use: No  . Drug Use: No  . Sexual Activity: Yes    Birth Control/ Protection: Injection   Other Topics Concern  . Not on file   Social History Narrative    Current Outpatient Prescriptions on File Prior to Visit  Medication Sig Dispense Refill  . calcium carbonate (OS-CAL) 600 MG TABS Take 600 mg by mouth 2 (two) times daily.    . Ferrous Sulfate (IRON) 325 (65 FE) MG TABS Take 1 tablet by mouth 2 (two) times daily.     Marland Kitchen glucose blood (ONETOUCH VERIO) test strip 1 each by Other route 2 (two) times daily. And lancets 2/day 250.03 100 each 12  . glucose blood test strip 1 each by Other route daily. And lancets 1/day 50 each 11  . Insulin Glargine (LANTUS) 100 UNIT/ML Solostar Pen Inject 80 Units into the skin  every morning.     Marland Kitchen losartan (COZAAR) 50 MG tablet TAKE 1 TABLET BY MOUTH DAILY. 30 tablet 8  . medroxyPROGESTERone (DEPO-PROVERA) 150 MG/ML injection Inject 150 mg into the muscle every 3 (three) months.    . Multiple Vitamin (MULTIVITAMIN) tablet Take 1 tablet by mouth 2 (two) times daily.    . naproxen (NAPROSYN) 500 MG tablet TAKE 1 TABLET BY MOUTH TWICE A DAY WITH MEALS 60 tablet 7  . omeprazole (PRILOSEC) 40 MG capsule TAKE ONE CAPSULE BY MOUTH EVERY DAY 30 capsule 8  . sertraline (ZOLOFT) 50 MG tablet 3 tabs daily 90 tablet 11  . vitamin B-12 (CYANOCOBALAMIN) 1000 MCG tablet Take 1,000 mcg by mouth daily.     No current facility-administered medications on file prior to visit.    Allergies  Allergen Reactions  . Sulfonamide Derivatives     Family History  Problem Relation Age of Onset  . Cancer Mother     Breast Cancer  . Diabetes Mother   . Cancer Sister 11    Colon Cancer  . Hypertension Father     BP 136/88 mmHg  Pulse 83  Temp(Src) 99.3 F (37.4 C) (Oral)  Ht 5' 7"  (1.702 m)  Wt 315 lb (142.883 kg)  BMI 49.32 kg/m2  SpO2 98%  Review of Systems  Constitutional: Negative for fever.  HENT: Negative for hearing loss.   Eyes: Negative for visual disturbance.  Respiratory: Negative for shortness of breath.   Cardiovascular: Negative for chest pain.  Gastrointestinal: Negative for blood in stool.  Endocrine: Negative for heat intolerance.  Genitourinary: Negative for hematuria.  Musculoskeletal: Negative for back pain.  Skin: Negative for rash.  Allergic/Immunologic: Negative for environmental allergies.  Neurological: Negative for numbness.  Hematological: Does not bruise/bleed easily.  Psychiatric/Behavioral:       Depression is improved on zoloft       Objective:   Physical Exam VS: see vs page GEN: no distress HEAD: head: no deformity eyes: no periorbital swelling, no proptosis external nose and ears are normal mouth: no lesion seen NECK:  large multinodular goiter is again noted CHEST WALL: no deformity LUNGS:  Clear to auscultation BREASTS: sees gyn CV: reg rate and rhythm, no murmur ABD: abdomen is soft, nontender.  no hepatosplenomegaly.  not distended.  no hernia GENITALIA/RECTAL: sees gyn MUSCULOSKELETAL: muscle bulk and strength are grossly normal.  no obvious joint swelling.  gait is normal and steady PULSES: no carotid bruit NEURO:  cn 2-12 grossly intact.   readily moves all 4's.  SKIN:  Normal texture and temperature.  No rash or suspicious lesion is visible.   NODES:  None palpable at the neck PSYCH: alert, well-oriented.  Does not appear anxious nor depressed.  (pt declines cpx labs today)    Assessment & Plan:  Wellness visit today, with problems stable, except as noted.    SEPARATE EVALUATION FOLLOWS--EACH PROBLEM HERE IS NEW, NOT RESPONDING TO TREATMENT, OR POSES SIGNIFICANT RISK TO THE PATIENT'S HEALTH: HISTORY OF THE PRESENT ILLNESS: Pt returns for f/u of diabetes mellitus: DM type: insulin-requiring type 2 Dx'ed:1992, during a pregnancy, but it persisted after the pregnancy Complications: non-proliferative retinopathy. Therapy: lantus DKA: never Severe hypoglycemia: last episode was 2009. Pancreatitis: never Other: she took insulin until she had bariatric surgery (gastric bypass) in 2008; she weighted 492 prior to the surgery; she was then controlled with orals until mid-2014, when she needed to resume the insulin; she takes qd insulin, at her request.   Interval history: no cbg record, but states cbg's are seldom low, and these episodes are mild.   PAST MEDICAL HISTORY reviewed and up to date today REVIEW OF SYSTEMS: Denies LOC PHYSICAL EXAMINATION: VITAL SIGNS:  See vs page GENERAL: no distress Pulses: dorsalis pedis intact bilat.   MSK: no deformity of the feet CV: 1+ bilat leg edema Skin:  no ulcer on the feet.  normal color and temp on the feet. Neuro: sensation is intact to touch on  the feet Ext: There is bilateral onychomycosis of the toenails LAB/XRAY RESULTS: Lab Results  Component Value Date   HGBA1C 6.6 11/08/2014  IMPRESSION: DM: overcontrolled, given this regimen, which does match insulin to her changing needs throughout the day. PLAN: Please reduce the lantus to 80 units each morning.

## 2014-11-08 NOTE — Patient Instructions (Addendum)
Please reduce the lantus to 80 units each morning.   please consider these measures for your health:  minimize alcohol.  do not use tobacco products.  have a colonoscopy at least every 10 years from age 52.  Women should have an annual mammogram from age 60.  keep firearms safely stored.  always use seat belts.  have working smoke alarms in your home.  see an eye doctor and dentist regularly.  never drive under the influence of alcohol or drugs (including prescription drugs).   Please come back for a follow-up appointment in 4 months. Please have the thyroid ultrasound rechecked.  you will receive a phone call, about a day and time for an appointment

## 2014-11-15 ENCOUNTER — Ambulatory Visit
Admission: RE | Admit: 2014-11-15 | Discharge: 2014-11-15 | Disposition: A | Discharge status: Home / Self Care | Payer: BLUE CROSS/BLUE SHIELD | Source: Ambulatory Visit | Attending: Endocrinology | Admitting: Endocrinology

## 2014-11-15 DIAGNOSIS — E042 Nontoxic multinodular goiter: Secondary | ICD-10-CM

## 2014-11-25 NOTE — Progress Notes (Signed)
   Subjective:    Patient ID: Suzanne Duke, female    DOB: [birth year], 54 y.o.   MRN: [id number]  HPI    Review of Systems     Objective:   Physical Exam    i personally reviewed electrocardiogram tracing (today): Indication: DM Impression: normal    Assessment & Plan:

## 2014-12-06 ENCOUNTER — Other Ambulatory Visit: Payer: Self-pay | Admitting: Endocrinology

## 2015-03-09 ENCOUNTER — Encounter: Payer: Self-pay | Admitting: Endocrinology

## 2015-03-09 ENCOUNTER — Other Ambulatory Visit: Payer: Self-pay

## 2015-03-09 ENCOUNTER — Other Ambulatory Visit: Payer: Self-pay | Admitting: Endocrinology

## 2015-03-09 ENCOUNTER — Ambulatory Visit (INDEPENDENT_AMBULATORY_CARE_PROVIDER_SITE_OTHER): Payer: BLUE CROSS/BLUE SHIELD | Admitting: Endocrinology

## 2015-03-09 VITALS — BP 154/88 | HR 95 | Temp 99.2°F | Ht 67.0 in | Wt 315.0 lb

## 2015-03-09 DIAGNOSIS — E103299 Type 1 diabetes mellitus with mild nonproliferative diabetic retinopathy without macular edema, unspecified eye: Secondary | ICD-10-CM | POA: Diagnosis not present

## 2015-03-09 LAB — POCT GLYCOSYLATED HEMOGLOBIN (HGB A1C): HEMOGLOBIN A1C: 7.3

## 2015-03-09 MED ORDER — INSULIN GLARGINE 100 UNIT/ML SOLOSTAR PEN: PEN_INJECTOR | SUBCUTANEOUS | Status: DC

## 2015-03-09 NOTE — Progress Notes (Signed)
Subjective:    Patient ID: Suzanne Duke, female    DOB: 02-28-1960, 55 y.o.   MRN: [id number]  HPI Pt returns for f/u of diabetes mellitus: DM type: insulin-requiring type 2 Dx'ed:1992, during a pregnancy, but it persisted after the pregnancy Complications: non-proliferative retinopathy. Therapy: lantus DKA: never Severe hypoglycemia: last episode was 2009.   Pancreatitis: never Other: she took insulin until she had bariatric surgery (gastric bypass) in 2008; she weighted 492 prior to the surgery (she declines repeat surgery, due to high copay); she was then controlled with orals until mid-2014, when she needed to resume the insulin; she takes qd insulin, at her request.   Interval history: no cbg record, but states cbg's are well-controlled.   Past Medical History  Diagnosis Date  . ANEMIA-IRON DEFICIENCY 02/17/2007  . ANXIETY 02/17/2007  . DEPRESSION 02/17/2007  . DM 07/17/2007  . Edema 07/21/2008  . GERD 05/11/2010  . GOITER, NONTOXIC MULTINODULAR 01/07/2007  . HYPERTENSION 09/21/2006  . LUMBAR RADICULOPATHY 01/13/2007  . UNSPECIFIED NEURALGIA NEURITIS AND RADICULITIS 01/07/2007  . Morbid obesity [facility])     Past Surgical History  Procedure Laterality Date  . Cholecystectomy  1992  . Treatment fistula anal  1993  . Gastric bypass open  2008  . Electrocardiogram  08/22/2005  . Incision and drainage perirectal abscess  1989    s/p    Social History   Social History  . Marital Status: Married    Spouse Name: N/A  . Number of Children: N/A  . Years of Education: N/A   Occupational History  . Not on file.   Social History Main Topics  . Smoking status: Current Every Day Smoker -- 0.50 packs/day    Types: Cigarettes  . Smokeless tobacco: Not on file  . Alcohol Use: No  . Drug Use: No  . Sexual Activity: Yes    Birth Control/ Protection: Injection   Other Topics Concern  . Not on file   Social History Narrative    Current Outpatient Prescriptions on File  Prior to Visit  Medication Sig Dispense Refill  . calcium carbonate (OS-CAL) 600 MG TABS Take 600 mg by mouth 2 (two) times daily.    . Ferrous Sulfate (IRON) 325 (65 FE) MG TABS Take 1 tablet by mouth 2 (two) times daily.     Marland Kitchen glucose blood (ONETOUCH VERIO) test strip 1 each by Other route 2 (two) times daily. And lancets 2/day 250.03 100 each 12  . glucose blood test strip 1 each by Other route daily. And lancets 1/day 50 each 11  . Insulin Glargine (LANTUS) 100 UNIT/ML Solostar Pen Inject 80 Units into the skin every morning.     Marland Kitchen losartan (COZAAR) 50 MG tablet TAKE 1 TABLET BY MOUTH DAILY. 30 tablet 8  . medroxyPROGESTERone (DEPO-PROVERA) 150 MG/ML injection Inject 150 mg into the muscle every 3 (three) months.    . Multiple Vitamin (MULTIVITAMIN) tablet Take 1 tablet by mouth 2 (two) times daily.    . naproxen (NAPROSYN) 500 MG tablet TAKE 1 TABLET BY MOUTH TWICE A DAY WITH MEALS 60 tablet 7  . omeprazole (PRILOSEC) 40 MG capsule TAKE ONE CAPSULE BY MOUTH EVERY DAY 30 capsule 8  . sertraline (ZOLOFT) 50 MG tablet TAKE 3 TABLETS BY MOUTH EVERY DAY 90 tablet 8  . vitamin B-12 (CYANOCOBALAMIN) 1000 MCG tablet Take 1,000 mcg by mouth daily.     No current facility-administered medications on file prior to visit.    Allergies  Allergen  Reactions  . Sulfonamide Derivatives     Family History  Problem Relation Age of Onset  . Cancer Mother     Breast Cancer  . Diabetes Mother   . Cancer Sister 35    Colon Cancer  . Hypertension Father     BP 154/88 mmHg  Pulse 95  Temp(Src) 99.2 F (37.3 C) (Oral)  Ht 5' 7"  (1.702 m)  Wt 315 lb (142.883 kg)  BMI 49.32 kg/m2  SpO2 98%  Review of Systems She denies hypoglycemia.      Objective:   Physical Exam VITAL SIGNS:  See vs page GENERAL: no distress Pulses: dorsalis pedis intact bilat.   MSK: no deformity of the feet CV: 1+ bilat leg edema Skin:  no ulcer on the feet.  normal color and temp on the feet. Neuro: sensation is  intact to touch on the feet.     A1c=7.3%    Assessment & Plan:  Obesity: improved with surgery, but persistent DM: this is the best control this pt should aim for, given this regimen, which does match insulin to her changing needs throughout the day.    Patient is advised the following: Patient Instructions  Please continue the same insulin for now.   check your blood sugar 4 times a day.  vary the time of day when you check, between before the 3 meals, and at bedtime.  also check if you have symptoms of your blood sugar being too high or too low.  please keep a record of the readings and bring it to your next appointment here (or you can bring the meter itself).  You can write it on any piece of paper.  please call us sooner if your blood sugar goes below 70, or if you have a lot of readings over 200.   Please come back for a follow-up appointment in 3 months.   Please consider having another weight loss surgery.  It is good for your health.  Here is some information about it.  If you decide to consider further, please call the phone number in the papers, and register for a free informational meeting.

## 2015-03-09 NOTE — Patient Instructions (Addendum)
Please continue the same insulin for now.   check your blood sugar 4 times a day.  vary the time of day when you check, between before the 3 meals, and at bedtime.  also check if you have symptoms of your blood sugar being too high or too low.  please keep a record of the readings and bring it to your next appointment here (or you can bring the meter itself).  You can write it on any piece of paper.  please call us sooner if your blood sugar goes below 70, or if you have a lot of readings over 200.   Please come back for a follow-up appointment in 3 months.   Please consider having another weight loss surgery.  It is good for your health.  Here is some information about it.  If you decide to consider further, please call the phone number in the papers, and register for a free informational meeting.

## 2015-03-10 ENCOUNTER — Telehealth: Payer: Self-pay | Admitting: Endocrinology

## 2015-03-10 NOTE — Telephone Encounter (Signed)
Pt advised of note below and voiced understanding.

## 2015-03-10 NOTE — Telephone Encounter (Signed)
please call patient: Ins declined basaglar, so please continue lantus

## 2015-03-29 ENCOUNTER — Other Ambulatory Visit: Payer: Self-pay

## 2015-03-29 MED ORDER — LOSARTAN POTASSIUM 50 MG PO TABS: 50.0000 mg | ORAL_TABLET | Freq: Every day | ORAL | Status: DC

## 2015-03-31 ENCOUNTER — Ambulatory Visit (INDEPENDENT_AMBULATORY_CARE_PROVIDER_SITE_OTHER)
Admission: RE | Admit: 2015-03-31 | Discharge: 2015-03-31 | Disposition: A | Discharge status: Home / Self Care | Payer: BLUE CROSS/BLUE SHIELD | Source: Ambulatory Visit | Attending: Endocrinology | Admitting: Endocrinology

## 2015-03-31 ENCOUNTER — Encounter: Payer: Self-pay | Admitting: Endocrinology

## 2015-03-31 ENCOUNTER — Ambulatory Visit (INDEPENDENT_AMBULATORY_CARE_PROVIDER_SITE_OTHER): Payer: BLUE CROSS/BLUE SHIELD | Admitting: Endocrinology

## 2015-03-31 VITALS — BP 144/82 | HR 110 | Temp 98.7°F | Ht 67.0 in | Wt 316.0 lb

## 2015-03-31 DIAGNOSIS — J209 Acute bronchitis, unspecified: Secondary | ICD-10-CM | POA: Diagnosis not present

## 2015-03-31 MED ORDER — INSULIN GLARGINE 100 UNIT/ML SOLOSTAR PEN: 80.0000 [IU] | PEN_INJECTOR | SUBCUTANEOUS | Status: DC

## 2015-03-31 MED ORDER — PROMETHAZINE-CODEINE 6.25-10 MG/5ML PO SYRP: 5.0000 mL | ORAL_SOLUTION | ORAL | Status: DC | PRN

## 2015-03-31 MED ORDER — AZITHROMYCIN 500 MG PO TABS: 500.0000 mg | ORAL_TABLET | Freq: Every day | ORAL | Status: DC

## 2015-03-31 MED ORDER — FLUTICASONE-SALMETEROL 100-50 MCG/DOSE IN AEPB: 1.0000 | INHALATION_SPRAY | Freq: Two times a day (BID) | RESPIRATORY_TRACT | Status: DC

## 2015-03-31 NOTE — Progress Notes (Signed)
Subjective:    Patient ID: Suzanne Duke, female    DOB: 04-07-1960, 55 y.o.   MRN: [id number]  HPI Pt states 2 weeks of moderate prod-quality cough in the chest, and assoc myalgias.  She has slight wheezing in the chest.  cbg's have been well-controlled.  Past Medical History  Diagnosis Date  . ANEMIA-IRON DEFICIENCY 02/17/2007  . ANXIETY 02/17/2007  . DEPRESSION 02/17/2007  . DM 07/17/2007  . Edema 07/21/2008  . GERD 05/11/2010  . GOITER, NONTOXIC MULTINODULAR 01/07/2007  . HYPERTENSION 09/21/2006  . LUMBAR RADICULOPATHY 01/13/2007  . UNSPECIFIED NEURALGIA NEURITIS AND RADICULITIS 01/07/2007  . Morbid obesity [facility])     Past Surgical History  Procedure Laterality Date  . Cholecystectomy  1992  . Treatment fistula anal  1993  . Gastric bypass open  2008  . Electrocardiogram  08/22/2005  . Incision and drainage perirectal abscess  1989    s/p    Social History   Social History  . Marital Status: Married    Spouse Name: N/A  . Number of Children: N/A  . Years of Education: N/A   Occupational History  . Not on file.   Social History Main Topics  . Smoking status: Current Every Day Smoker -- 0.50 packs/day    Types: Cigarettes  . Smokeless tobacco: Not on file  . Alcohol Use: No  . Drug Use: No  . Sexual Activity: Yes    Birth Control/ Protection: Injection   Other Topics Concern  . Not on file   Social History Narrative    Current Outpatient Prescriptions on File Prior to Visit  Medication Sig Dispense Refill  . calcium carbonate (OS-CAL) 600 MG TABS Take 600 mg by mouth 2 (two) times daily.    . Ferrous Sulfate (IRON) 325 (65 FE) MG TABS Take 1 tablet by mouth 2 (two) times daily.     Marland Kitchen glucose blood (ONETOUCH VERIO) test strip 1 each by Other route 2 (two) times daily. And lancets 2/day 250.03 100 each 12  . glucose blood test strip 1 each by Other route daily. And lancets 1/day 50 each 11  . losartan (COZAAR) 50 MG tablet Take 1 tablet (50 mg total) by  mouth daily. 90 tablet 2  . medroxyPROGESTERone (DEPO-PROVERA) 150 MG/ML injection Inject 150 mg into the muscle every 3 (three) months.    . Multiple Vitamin (MULTIVITAMIN) tablet Take 1 tablet by mouth 2 (two) times daily.    . naproxen (NAPROSYN) 500 MG tablet TAKE 1 TABLET BY MOUTH TWICE A DAY WITH MEALS 60 tablet 7  . omeprazole (PRILOSEC) 40 MG capsule TAKE ONE CAPSULE BY MOUTH EVERY DAY 30 capsule 8  . sertraline (ZOLOFT) 50 MG tablet TAKE 3 TABLETS BY MOUTH EVERY DAY 90 tablet 8  . vitamin B-12 (CYANOCOBALAMIN) 1000 MCG tablet Take 1,000 mcg by mouth daily.     No current facility-administered medications on file prior to visit.    Allergies  Allergen Reactions  . Sulfonamide Derivatives     Family History  Problem Relation Age of Onset  . Cancer Mother     Breast Cancer  . Diabetes Mother   . Cancer Sister 3    Colon Cancer  . Hypertension Father     BP 144/82 mmHg  Pulse 110  Temp(Src) 98.7 F (37.1 C) (Oral)  Ht 5' 7"  (1.702 m)  Wt 316 lb (143.337 kg)  BMI 49.48 kg/m2  SpO2 95%  Review of Systems No fever, sob or earache.  She has headache and nasal congestion.      Objective:   Physical Exam VITAL SIGNS:  See vs page GENERAL: no distress head: no deformity eyes: no periorbital swelling, no proptosis external nose and ears are normal mouth: no lesion seen. Both eac's and tm's are normal. LUNGS:  Clear to auscultation.    CXR: NAD    Assessment & Plan:  Acute bronchitis, new DM: well-controlled.  Please continue the same insulin HTN: prob exac by acute illness: we'll recheck in the future  Patient is advised the following: Patient Instructions  i have sent a prescription to your pharmacy, for an antibiotic and inhaler. Here is a prescription for cough syrup. Loratadine-d (non-prescription) will help your congestion. A chest x-ray is requested for you today.  We'll let you know about the results. I hope you feel better soon.  If you don't feel  better in 2 weeks, please call back.  Please call sooner if you get worse.

## 2015-03-31 NOTE — Patient Instructions (Addendum)
i have sent a prescription to your pharmacy, for an antibiotic and inhaler. Here is a prescription for cough syrup. Loratadine-d (non-prescription) will help your congestion. A chest x-ray is requested for you today.  We'll let you know about the results. I hope you feel better soon.  If you don't feel better in 2 weeks, please call back.  Please call sooner if you get worse.

## 2015-06-25 ENCOUNTER — Other Ambulatory Visit: Payer: Self-pay | Admitting: Endocrinology

## 2015-07-06 ENCOUNTER — Other Ambulatory Visit: Payer: Self-pay

## 2015-07-08 ENCOUNTER — Ambulatory Visit: Payer: BLUE CROSS/BLUE SHIELD | Admitting: Endocrinology

## 2015-07-28 ENCOUNTER — Ambulatory Visit (INDEPENDENT_AMBULATORY_CARE_PROVIDER_SITE_OTHER): Payer: BLUE CROSS/BLUE SHIELD | Admitting: Endocrinology

## 2015-07-28 ENCOUNTER — Encounter: Payer: Self-pay | Admitting: Endocrinology

## 2015-07-28 VITALS — BP 136/87 | HR 85 | Temp 98.7°F | Ht 67.0 in | Wt 323.0 lb

## 2015-07-28 DIAGNOSIS — E103299 Type 1 diabetes mellitus with mild nonproliferative diabetic retinopathy without macular edema, unspecified eye: Secondary | ICD-10-CM | POA: Diagnosis not present

## 2015-07-28 LAB — POCT GLYCOSYLATED HEMOGLOBIN (HGB A1C): HEMOGLOBIN A1C: 6.9

## 2015-07-28 MED ORDER — INSULIN GLARGINE 100 UNIT/ML SOLOSTAR PEN: 75.0000 [IU] | PEN_INJECTOR | SUBCUTANEOUS | Status: DC

## 2015-07-28 NOTE — Patient Instructions (Addendum)
Please come back for a regular physical appointment in 4 months.   Please reduce the insulin to 75 units each morning check your blood sugar twice a day.  vary the time of day when you check, between before the 3 meals, and at bedtime.  also check if you have symptoms of your blood sugar being too high or too low.  please keep a record of the readings and bring it to your next appointment here (or you can bring the meter itself).  You can write it on any piece of paper.  please call us sooner if your blood sugar goes below 70, or if you have a lot of readings over 200.

## 2015-07-28 NOTE — Progress Notes (Signed)
Subjective:    Patient ID: Suzanne Duke, female    DOB: 21-Jun-1960, 55 y.o.   MRN: [id number]  HPI Pt returns for f/u of diabetes mellitus: DM type: insulin-requiring type 2 Dx'ed:1992, during a pregnancy, but it persisted after the pregnancy Complications: non-proliferative retinopathy. Therapy: insulin since 2014. DKA: never Severe hypoglycemia: last episode was 2009.   Pancreatitis: never Other: she took insulin until she had bariatric surgery (gastric bypass) in 2008; she weighted 492 prior to the surgery (she declines repeat surgery); she was then controlled with orals until mid-2014, when she needed to resume the insulin; she declines multiple daily injections.   Interval history: no cbg record, but states cbg's are well-controlled.  She seldom has hypoglycemia, and these episodes are mild.  She takes 80 units qd.   Past Medical History  Diagnosis Date  . ANEMIA-IRON DEFICIENCY 02/17/2007  . ANXIETY 02/17/2007  . DEPRESSION 02/17/2007  . DM 07/17/2007  . Edema 07/21/2008  . GERD 05/11/2010  . GOITER, NONTOXIC MULTINODULAR 01/07/2007  . HYPERTENSION 09/21/2006  . LUMBAR RADICULOPATHY 01/13/2007  . UNSPECIFIED NEURALGIA NEURITIS AND RADICULITIS 01/07/2007  . Morbid obesity [facility])     Past Surgical History  Procedure Laterality Date  . Cholecystectomy  1992  . Treatment fistula anal  1993  . Gastric bypass open  2008  . Electrocardiogram  08/22/2005  . Incision and drainage perirectal abscess  1989    s/p    Social History   Social History  . Marital Status: Married    Spouse Name: N/A  . Number of Children: N/A  . Years of Education: N/A   Occupational History  . Not on file.   Social History Main Topics  . Smoking status: Current Every Day Smoker -- 0.50 packs/day    Types: Cigarettes  . Smokeless tobacco: Not on file  . Alcohol Use: No  . Drug Use: No  . Sexual Activity: Yes    Birth Control/ Protection: Injection   Other Topics Concern  . Not on file     Social History Narrative    Current Outpatient Prescriptions on File Prior to Visit  Medication Sig Dispense Refill  . BD PEN NEEDLE NANO U/F 32G X 4 MM MISC USE WITH INSULIN PENS AS DIRECTED 100 each 0  . calcium carbonate (OS-CAL) 600 MG TABS Take 600 mg by mouth 2 (two) times daily.    . Ferrous Sulfate (IRON) 325 (65 FE) MG TABS Take 1 tablet by mouth 2 (two) times daily.     . Fluticasone-Salmeterol (ADVAIR) 100-50 MCG/DOSE AEPB Inhale 1 puff into the lungs 2 (two) times daily. 1 each 3  . glucose blood (ONETOUCH VERIO) test strip 1 each by Other route 2 (two) times daily. And lancets 2/day 250.03 100 each 12  . glucose blood test strip 1 each by Other route daily. And lancets 1/day 50 each 11  . losartan (COZAAR) 50 MG tablet Take 1 tablet (50 mg total) by mouth daily. 90 tablet 2  . medroxyPROGESTERone (DEPO-PROVERA) 150 MG/ML injection Inject 150 mg into the muscle every 3 (three) months.    . Multiple Vitamin (MULTIVITAMIN) tablet Take 1 tablet by mouth 2 (two) times daily.    . naproxen (NAPROSYN) 500 MG tablet TAKE 1 TABLET BY MOUTH TWICE A DAY WITH MEALS 60 tablet 7  . omeprazole (PRILOSEC) 40 MG capsule TAKE ONE CAPSULE BY MOUTH EVERY DAY 30 capsule 8  . sertraline (ZOLOFT) 50 MG tablet TAKE 3 TABLETS BY MOUTH EVERY  DAY 90 tablet 8  . vitamin B-12 (CYANOCOBALAMIN) 1000 MCG tablet Take 1,000 mcg by mouth daily.     No current facility-administered medications on file prior to visit.    Allergies  Allergen Reactions  . Sulfonamide Derivatives     Family History  Problem Relation Age of Onset  . Cancer Mother     Breast Cancer  . Diabetes Mother   . Cancer Sister 56    Colon Cancer  . Hypertension Father     BP 136/87 mmHg  Pulse 85  Temp(Src) 98.7 F (37.1 C) (Oral)  Ht 5' 7"  (1.702 m)  Wt 323 lb (146.512 kg)  BMI 50.58 kg/m2  SpO2 98%  Review of Systems Denies LOC    Objective:   Physical Exam VITAL SIGNS:  See vs page GENERAL: no distress Pulses:  dorsalis pedis intact bilat.   MSK: no deformity of the feet CV: trace bilat leg edema Skin:  no ulcer on the feet, but the skin is dry.  normal color and temp on the feet.  Neuro: sensation is intact to touch on the feet.  Ext: There is bilateral onychomycosis of the toenails.     Lab Results  Component Value Date   HGBA1C 6.9 07/28/2015      Assessment & Plan:  Insulin-requiring type 2 DM: overcontrolled, given this regimen, which does match insulin to her changing needs throughout the day.   Patient is advised the following: Patient Instructions  Please come back for a regular physical appointment in 4 months.   Please reduce the insulin to 75 units each morning check your blood sugar twice a day.  vary the time of day when you check, between before the 3 meals, and at bedtime.  also check if you have symptoms of your blood sugar being too high or too low.  please keep a record of the readings and bring it to your next appointment here (or you can bring the meter itself).  You can write it on any piece of paper.  please call us sooner if your blood sugar goes below 70, or if you have a lot of readings over 200.   Renato Shin, MD

## 2015-08-24 ENCOUNTER — Telehealth: Payer: Self-pay

## 2015-08-24 NOTE — Telephone Encounter (Signed)
We have received pt's medical records from [place]

## 2015-08-30 ENCOUNTER — Encounter: Payer: Self-pay | Admitting: Physician Assistant

## 2015-11-15 ENCOUNTER — Other Ambulatory Visit: Payer: Self-pay

## 2015-11-15 MED ORDER — SERTRALINE HCL 50 MG PO TABS: 150.0000 mg | ORAL_TABLET | Freq: Every day | ORAL | 1 refills | Status: DC

## 2015-11-21 ENCOUNTER — Ambulatory Visit (INDEPENDENT_AMBULATORY_CARE_PROVIDER_SITE_OTHER): Payer: BLUE CROSS/BLUE SHIELD | Admitting: Family Medicine

## 2015-11-21 VITALS — BP 134/72 | HR 92 | Temp 100.1°F | Resp 17 | Ht 65.0 in | Wt 320.6 lb

## 2015-11-21 DIAGNOSIS — N912 Amenorrhea, unspecified: Secondary | ICD-10-CM

## 2015-11-21 DIAGNOSIS — N926 Irregular menstruation, unspecified: Secondary | ICD-10-CM | POA: Diagnosis not present

## 2015-11-21 LAB — POCT URINE PREGNANCY: Preg Test, Ur: NEGATIVE

## 2015-11-21 NOTE — Progress Notes (Signed)
Patient ID: Suzanne Duke, female    DOB: [birth year]  Age: 55 y.o. MRN: [id number]  Chief Complaint  Patient presents with  . Medication Management    Depo-Provera, 6 months passed due   . Amenorrhea    Subjective:   55 year old lady who is been on Depo-Provera for 53 years. She has not taken shot for 6 months and has not had a menstrual cycle. She is here to consider what to do next. She has a regular internist who she sees for diabetes. She had lost huge amount of weight when she had stomach bypass surgery some years back, but she has gained some of that back. She is still about 150 pounds less than her 53 maximum. She says she felt a little easier to lose weight since she's not been taking the Depo-Provera last few months.  Current allergies, medications, problem list, past/family and social histories reviewed.  Objective:  BP 134/72 (BP Location: Left Arm, Patient Position: Sitting, Cuff Size: Large)   Pulse 92   Temp 100.1 F (37.8 C) (Oral) Comment: just ate  Resp 17   Ht 5' 5"  (1.651 m)   Wt (!) 320 lb 9.6 oz (145.4 kg)   SpO2 96%   BMI 53.35 kg/m   Did not do a physical examination today. Had a long discussion with her about pros and cons.  Assessment & Plan:   Assessment: 1. Missed periods   2. Amenorrhea       Plan: See instructions. Check FSH and LH.  Results for orders placed or performed in visit on 11/21/15  POCT urine pregnancy  Result Value Ref Range   Preg Test, Ur Negative Negative     Orders Placed This Encounter  Procedures  . FSH/LH  . POCT urine pregnancy    No orders of the defined types were placed in this encounter.        Patient Instructions    We will let you know the results of your labs in the next week or so. You can consider continue using condoms in the meanwhile.  If the labs indicate that you have gone through menopause, then I think you're safe staying off any contraception.  If labs indicate that you have possibly  not going to menopause yet, then we probably should consider continuing using condoms for 6 months and repeating lab work in 6 months.  Keep working hard on weight loss.  Return as needed.   IF you received an x-ray today, you will receive an invoice from [facility]. Please contact [facility] at [phone] with questions or concerns regarding your invoice.   IF you received labwork today, you will receive an invoice from [employer]. Please contact [facility] at [phone] with questions or concerns regarding your invoice.   Our billing staff will not be able to assist you with questions regarding bills from these companies.  You will be contacted with the lab results as soon as they are available. The fastest way to get your results is to activate your My Chart account. Instructions are located on the last page of this paperwork. If you have not heard from [place] regarding the results in 2 weeks, please contact this office.         No Follow-up on file.   Hasten Sweitzer, MD 11/21/2015

## 2015-11-21 NOTE — Patient Instructions (Addendum)
  We will let you know the results of your labs in the next week or so. You can consider continue using condoms in the meanwhile.  If the labs indicate that you have gone through menopause, then I think you're safe staying off any contraception.  If labs indicate that you have possibly not going to menopause yet, then we probably should consider continuing using condoms for 6 months and repeating lab work in 6 months.  Keep working hard on weight loss.  Return as needed.   IF you received an x-ray today, you will receive an invoice from [facility]. Please contact [facility] at [phone] with questions or concerns regarding your invoice.   IF you received labwork today, you will receive an invoice from [employer]. Please contact [facility] at [phone] with questions or concerns regarding your invoice.   Our billing staff will not be able to assist you with questions regarding bills from these companies.  You will be contacted with the lab results as soon as they are available. The fastest way to get your results is to activate your My Chart account. Instructions are located on the last page of this paperwork. If you have not heard from [place] regarding the results in 2 weeks, please contact this office.

## 2015-11-22 LAB — FSH/LH
FSH: 34.4 m[IU]/mL
LH: 21.1 m[IU]/mL

## 2015-11-25 ENCOUNTER — Ambulatory Visit (INDEPENDENT_AMBULATORY_CARE_PROVIDER_SITE_OTHER): Payer: BLUE CROSS/BLUE SHIELD | Admitting: Family Medicine

## 2015-11-25 VITALS — BP 117/75 | HR 78 | Temp 98.4°F | Ht 65.0 in | Wt 320.8 lb

## 2015-11-25 DIAGNOSIS — E049 Nontoxic goiter, unspecified: Secondary | ICD-10-CM | POA: Diagnosis not present

## 2015-11-25 DIAGNOSIS — Z136 Encounter for screening for cardiovascular disorders: Secondary | ICD-10-CM | POA: Diagnosis not present

## 2015-11-25 DIAGNOSIS — Z1383 Encounter for screening for respiratory disorder NEC: Secondary | ICD-10-CM | POA: Diagnosis not present

## 2015-11-25 DIAGNOSIS — Z0000 Encounter for general adult medical examination without abnormal findings: Secondary | ICD-10-CM

## 2015-11-25 DIAGNOSIS — Z130 Encounter for screening for diseases of the blood and blood-forming organs and certain disorders involving the immune mechanism: Secondary | ICD-10-CM | POA: Diagnosis not present

## 2015-11-25 DIAGNOSIS — Z23 Encounter for immunization: Secondary | ICD-10-CM

## 2015-11-25 DIAGNOSIS — Z1389 Encounter for screening for other disorder: Secondary | ICD-10-CM

## 2015-11-25 LAB — LIPID PANEL
CHOL/HDL RATIO: 2.4 ratio (ref <=5.0)
Cholesterol: 154 mg/dL (ref 125–200)
HDL: 63 mg/dL (ref >=46)
LDL CALC: 83 mg/dL (ref <130)
Triglycerides: 42 mg/dL (ref <150)
VLDL: 8 mg/dL (ref <30)

## 2015-11-25 LAB — POCT URINALYSIS DIP (MANUAL ENTRY)
BILIRUBIN UA: NEGATIVE
BILIRUBIN UA: NEGATIVE
Glucose, UA: NEGATIVE
LEUKOCYTES UA: NEGATIVE
Nitrite, UA: POSITIVE — AB
PH UA: 6
Protein Ur, POC: NEGATIVE
SPEC GRAV UA: 1.025
Urobilinogen, UA: 0.2

## 2015-11-25 LAB — CBC
HEMATOCRIT: 37.3 % (ref 35.0–45.0)
Hemoglobin: 12.3 g/dL (ref 11.7–15.5)
MCH: 29 pg (ref 27.0–33.0)
MCHC: 33 g/dL (ref 32.0–36.0)
MCV: 88 fL (ref 80.0–100.0)
MPV: 9.5 fL (ref 7.5–12.5)
Platelets: 274 10*3/uL (ref 140–400)
RBC: 4.24 MIL/uL (ref 3.80–5.10)
RDW: 13 % (ref 11.0–15.0)
WBC: 6.7 10*3/uL (ref 3.8–10.8)

## 2015-11-25 LAB — COMPREHENSIVE METABOLIC PANEL
ALBUMIN: 3.9 g/dL (ref 3.6–5.1)
ALK PHOS: 126 U/L (ref 33–130)
ALT: 27 U/L (ref 6–29)
AST: 27 U/L (ref 10–35)
BILIRUBIN TOTAL: 0.5 mg/dL (ref 0.2–1.2)
BUN: 9 mg/dL (ref 7–25)
CO2: 23 mmol/L (ref 20–31)
CREATININE: 0.65 mg/dL (ref 0.50–1.05)
Calcium: 9.1 mg/dL (ref 8.6–10.4)
Chloride: 105 mmol/L (ref 98–110)
Glucose, Bld: 104 mg/dL — ABNORMAL HIGH (ref 65–99)
Potassium: 3.9 mmol/L (ref 3.5–5.3)
SODIUM: 143 mmol/L (ref 135–146)
TOTAL PROTEIN: 6.7 g/dL (ref 6.1–8.1)

## 2015-11-25 LAB — POC MICROSCOPIC URINALYSIS (UMFC): MUCUS RE: ABSENT

## 2015-11-25 LAB — TSH: TSH: 0.68 m[IU]/L

## 2015-11-25 NOTE — Progress Notes (Signed)
   Subjective:    Patient ID: Suzanne Duke, female    DOB: 1960-11-03, 55 y.o.   MRN: [id number]  HPI    Review of Systems  Constitutional: Negative.   HENT: Negative.   Eyes: Negative.   Respiratory: Negative.   Cardiovascular: Negative.   Gastrointestinal: Negative.   Endocrine: Negative.   Genitourinary: Negative.   Musculoskeletal: Positive for arthralgias and back pain.  Skin: Negative.   Allergic/Immunologic: Negative.   Neurological: Positive for numbness.  Hematological: Negative.   Psychiatric/Behavioral: Negative.        Objective:   Physical Exam        Assessment & Plan:

## 2015-11-25 NOTE — Patient Instructions (Addendum)
IF you received an x-ray today, you will receive an invoice from [facility]. Please contact [facility] at [phone] with questions or concerns regarding your invoice.   IF you received labwork today, you will receive an invoice from [employer]. Please contact [facility] at [phone] with questions or concerns regarding your invoice.   Our billing staff will not be able to assist you with questions regarding bills from these companies.  You will be contacted with the lab results as soon as they are available. The fastest way to get your results is to activate your My Chart account. Instructions are located on the last page of this paperwork. If you have not heard from [place] regarding the results in 2 weeks, please contact this office.    Influenza (Flu) Vaccine (Inactivated or Recombinant):  1. Why get vaccinated? Influenza ("flu") is a contagious disease that spreads around the [place] every year, usually between October and May. Flu is caused by influenza viruses, and is spread mainly by coughing, sneezing, and close contact. Anyone can get flu. Flu strikes suddenly and can last several days. Symptoms vary by age, but can include:  fever/chills  sore throat  muscle aches  fatigue  cough  headache  runny or stuffy nose Flu can also lead to pneumonia and blood infections, and cause diarrhea and seizures in children. If you have a medical condition, such as heart or lung disease, flu can make it worse. Flu is more dangerous for some people. Infants and young children, people 45 years of age and older, pregnant women, and people with certain health conditions or a weakened immune system are at greatest risk. Each year thousands of people in the [place] die from flu, and many more are hospitalized. Flu vaccine can:  keep you from getting flu,  make flu less severe if you do get it, and  keep you from spreading flu to  your family and other people. 2. Inactivated and recombinant flu vaccines A dose of flu vaccine is recommended every flu season. Children 6 months through 85 years of age may need two doses during the same flu season. Everyone else needs only one dose each flu season. Some inactivated flu vaccines contain a very small amount of a mercury-based preservative called thimerosal. Studies have not shown thimerosal in vaccines to be harmful, but flu vaccines that do not contain thimerosal are available. There is no live flu virus in flu shots. They cannot cause the flu. There are many flu viruses, and they are always changing. Each year a new flu vaccine is made to protect against three or four viruses that are likely to cause disease in the upcoming flu season. But even when the vaccine doesn't exactly match these viruses, it may still provide some protection. Flu vaccine cannot prevent:  flu that is caused by a virus not covered by the vaccine, or  illnesses that look like flu but are not. It takes about 2 weeks for protection to develop after vaccination, and protection lasts through the flu season. 3. Some people should not get this vaccine Tell the person who is giving you the vaccine:  If you have any severe, life-threatening allergies. If you ever had a life-threatening allergic reaction after a dose of flu vaccine, or have a severe allergy to any part of this vaccine, you may be advised not to get vaccinated. Most, but not all, types of flu vaccine contain a small amount of egg protein.  If you ever had Guillain-Barre Syndrome (also called GBS). Some people with a history of GBS should not get this vaccine. This should be discussed with your doctor.  If you are not feeling well. It is usually okay to get flu vaccine when you have a mild illness, but you might be asked to come back when you feel better. 4. Risks of a vaccine reaction With any medicine, including vaccines, there is a chance of  reactions. These are usually mild and go away on their own, but serious reactions are also possible. Most people who get a flu shot do not have any problems with it. Minor problems following a flu shot include:  soreness, redness, or swelling where the shot was given  hoarseness  sore, red or itchy eyes  cough  fever  aches  headache  itching  fatigue If these problems occur, they usually begin soon after the shot and last 1 or 2 days. More serious problems following a flu shot can include the following:  There may be a small increased risk of Guillain-Barre Syndrome (GBS) after inactivated flu vaccine. This risk has been estimated at 1 or 2 additional cases per million people vaccinated. This is much lower than the risk of severe complications from flu, which can be prevented by flu vaccine.  Young children who get the flu shot along with pneumococcal vaccine (PCV13) and/or DTaP vaccine at the same time might be slightly more likely to have a seizure caused by fever. Ask your doctor for more information. Tell your doctor if a child who is getting flu vaccine has ever had a seizure. Problems that could happen after any injected vaccine:  People sometimes faint after a medical procedure, including vaccination. Sitting or lying down for about 15 minutes can help prevent fainting, and injuries caused by a fall. Tell your doctor if you feel dizzy, or have vision changes or ringing in the ears.  Some people get severe pain in the shoulder and have difficulty moving the arm where a shot was given. This happens very rarely.  Any medication can cause a severe allergic reaction. Such reactions from a vaccine are very rare, estimated at about 1 in a million doses, and would happen within a few minutes to a few hours after the vaccination. As with any medicine, there is a very remote chance of a vaccine causing a serious injury or death. The safety of vaccines is always being monitored. For  more information, visit: http://www.aguilar.org/ 5. What if there is a serious reaction? What should I look for?  Look for anything that concerns you, such as signs of a severe allergic reaction, very high fever, or unusual behavior. Signs of a severe allergic reaction can include hives, swelling of the face and throat, difficulty breathing, a fast heartbeat, dizziness, and weakness. These would start a few minutes to a few hours after the vaccination. What should I do?  If you think it is a severe allergic reaction or other emergency that can't wait, call 9-1-1 and get the person to the nearest hospital. Otherwise, call your doctor.  Reactions should be reported to the Vaccine Adverse Event Reporting System (VAERS). Your doctor should file this report, or you can do it yourself through the VAERS web site at www.vaers.SamedayNews.es, or by calling 7812405666. VAERS does not give medical advice. 6. The National Vaccine Injury Compensation Program The National Vaccine Injury Compensation Program (VICP) is a federal program that was created to compensate people who may have been  injured by certain vaccines. Persons who believe they may have been injured by a vaccine can learn about the program and about filing a claim by calling 804-651-0424 or visiting the [place] website at GoldCloset.com.ee. There is a time limit to file a claim for compensation. 7. How can I learn more?  Ask your healthcare provider. He or she can give you the vaccine package insert or suggest other sources of information.  Call your local or state health department.  Contact the Centers for Disease Control and Prevention (CDC):  Call (786) 691-0214 (1-800-CDC-INFO) or  Visit CDC's website at https://gibson.com/ Vaccine Information Statement Inactivated Influenza Vaccine (10/02/2013)   This information is not intended to replace advice given to you by your health care provider. Make sure you discuss any  questions you have with your health care provider.   Document Released: 12/07/2005 Document Revised: 03/05/2014 Document Reviewed: 10/05/2013 Elsevier Interactive Patient Education [employer]. Menopause is a normal process in which your reproductive ability comes to an end. This process happens gradually over a span of months to years, usually between the ages of 53 and 64. Menopause is complete when you have missed 12 consecutive menstrual periods. It is important to talk with your health care provider about some of the most common conditions that affect postmenopausal women, such as heart disease, cancer, and bone loss (osteoporosis). Adopting a healthy lifestyle and getting preventive care can help to promote your health and wellness. Those actions can also lower your chances of developing some of these common conditions. WHAT SHOULD I KNOW ABOUT MENOPAUSE? During menopause, you may experience a number of symptoms, such as:  Moderate-to-severe hot flashes.  Night sweats.  Decrease in sex drive.  Mood swings.  Headaches.  Tiredness.  Irritability.  Memory problems.  Insomnia. Choosing to treat or not to treat menopausal changes is an individual decision that you make with your health care provider. WHAT SHOULD I KNOW ABOUT HORMONE REPLACEMENT THERAPY AND SUPPLEMENTS? Hormone therapy products are effective for treating symptoms that are associated with menopause, such as hot flashes and night sweats. Hormone replacement carries certain risks, especially as you become older. If you are thinking about using estrogen or estrogen with progestin treatments, discuss the benefits and risks with your health care provider. WHAT SHOULD I KNOW ABOUT HEART DISEASE AND STROKE? Heart disease, heart attack, and stroke become more likely as you age. This may be due, in part, to the hormonal changes that your body experiences during menopause. These can affect how your body processes dietary  fats, triglycerides, and cholesterol. Heart attack and stroke are both medical emergencies. There are many things that you can do to help prevent heart disease and stroke:  Have your blood pressure checked at least every 1-2 years. High blood pressure causes heart disease and increases the risk of stroke.  If you are 48-76 years old, ask your health care provider if you should take aspirin to prevent a heart attack or a stroke.  Do not use any tobacco products, including cigarettes, chewing tobacco, or electronic cigarettes. If you need help quitting, ask your health care provider.  It is important to eat a healthy diet and maintain a healthy weight.  Be sure to include plenty of vegetables, fruits, low-fat dairy products, and lean protein.  Avoid eating foods that are high in solid fats, added sugars, or salt (sodium).  Get regular exercise. This is one of the most important things that you can do for your health.  Try to  exercise for at least 150 minutes each week. The type of exercise that you do should increase your heart rate and make you sweat. This is known as moderate-intensity exercise.  Try to do strengthening exercises at least twice each week. Do these in addition to the moderate-intensity exercise.  Know your numbers.Ask your health care provider to check your cholesterol and your blood glucose. Continue to have your blood tested as directed by your health care provider. WHAT SHOULD I KNOW ABOUT CANCER SCREENING? There are several types of cancer. Take the following steps to reduce your risk and to catch any cancer development as early as possible. Breast Cancer  Practice breast self-awareness.  This means understanding how your breasts normally appear and feel.  It also means doing regular breast self-exams. Let your health care provider know about any changes, no matter how small.  If you are 40 or older, have a clinician do a breast exam (clinical breast exam or CBE)  every year. Depending on your age, family history, and medical history, it may be recommended that you also have a yearly breast X-ray (mammogram).  If you have a family history of breast cancer, talk with your health care provider about genetic screening.  If you are at high risk for breast cancer, talk with your health care provider about having an MRI and a mammogram every year.  Breast cancer (BRCA) gene test is recommended for women who have family members with BRCA-related cancers. Results of the assessment will determine the need for genetic counseling and BRCA1 and for BRCA2 testing. BRCA-related cancers include these types:  Breast. This occurs in males or females.  Ovarian.  Tubal. This may also be called fallopian tube cancer.  Cancer of the abdominal or pelvic lining (peritoneal cancer).  Prostate.  Pancreatic. Cervical, Uterine, and Ovarian Cancer Your health care provider may recommend that you be screened regularly for cancer of the pelvic organs. These include your ovaries, uterus, and vagina. This screening involves a pelvic exam, which includes checking for microscopic changes to the surface of your cervix (Pap test).  For women ages 21-65, health care providers may recommend a pelvic exam and a Pap test every three years. For women ages 55-65, they may recommend the Pap test and pelvic exam, combined with testing for human papilloma virus (HPV), every five years. Some types of HPV increase your risk of cervical cancer. Testing for HPV may also be done on women of any age who have unclear Pap test results.  Other health care providers may not recommend any screening for nonpregnant women who are considered low risk for pelvic cancer and have no symptoms. Ask your health care provider if a screening pelvic exam is right for you.  If you have had past treatment for cervical cancer or a condition that could lead to cancer, you need Pap tests and screening for cancer for at  least 20 years after your treatment. If Pap tests have been discontinued for you, your risk factors (such as having a new sexual partner) need to be reassessed to determine if you should start having screenings again. Some women have medical problems that increase the chance of getting cervical cancer. In these cases, your health care provider may recommend that you have screening and Pap tests more often.  If you have a family history of uterine cancer or ovarian cancer, talk with your health care provider about genetic screening.  If you have vaginal bleeding after reaching menopause, tell your health care  provider.  There are currently no reliable tests available to screen for ovarian cancer. Lung Cancer Lung cancer screening is recommended for adults 33-8 years old who are at high risk for lung cancer because of a history of smoking. A yearly low-dose CT scan of the lungs is recommended if you:  Currently smoke.  Have a history of at least 30 pack-years of smoking and you currently smoke or have quit within the past 15 years. A pack-year is smoking an average of one pack of cigarettes per day for one year. Yearly screening should:  Continue until it has been 15 years since you quit.  Stop if you develop a health problem that would prevent you from having lung cancer treatment. Colorectal Cancer  This type of cancer can be detected and can often be prevented.  Routine colorectal cancer screening usually begins at age 72 and continues through age 41.  If you have risk factors for colon cancer, your health care provider may recommend that you be screened at an earlier age.  If you have a family history of colorectal cancer, talk with your health care provider about genetic screening.  Your health care provider may also recommend using home test kits to check for hidden blood in your stool.  A small camera at the end of a tube can be used to examine your colon directly (sigmoidoscopy  or colonoscopy). This is done to check for the earliest forms of colorectal cancer.  Direct examination of the colon should be repeated every 5-10 years until age 3. However, if early forms of precancerous polyps or small growths are found or if you have a family history or genetic risk for colorectal cancer, you may need to be screened more often. Skin Cancer  Check your skin from head to toe regularly.  Monitor any moles. Be sure to tell your health care provider:  About any new moles or changes in moles, especially if there is a change in a mole's shape or color.  If you have a mole that is larger than the size of a pencil eraser.  If any of your family members has a history of skin cancer, especially at a young age, talk with your health care provider about genetic screening.  Always use sunscreen. Apply sunscreen liberally and repeatedly throughout the day.  Whenever you are outside, protect yourself by wearing long sleeves, pants, a wide-brimmed hat, and sunglasses. WHAT SHOULD I KNOW ABOUT OSTEOPOROSIS? Osteoporosis is a condition in which bone destruction happens more quickly than new bone creation. After menopause, you may be at an increased risk for osteoporosis. To help prevent osteoporosis or the bone fractures that can happen because of osteoporosis, the following is recommended:  If you are 38-25 years old, get at least 1,000 mg of calcium and at least 600 mg of vitamin D per day.  If you are older than age 57 but younger than age 61, get at least 1,200 mg of calcium and at least 600 mg of vitamin D per day.  If you are older than age 88, get at least 1,200 mg of calcium and at least 800 mg of vitamin D per day. Smoking and excessive alcohol intake increase the risk of osteoporosis. Eat foods that are rich in calcium and vitamin D, and do weight-bearing exercises several times each week as directed by your health care provider. WHAT SHOULD I KNOW ABOUT HOW MENOPAUSE AFFECTS  [place]? Depression may occur at any age, but it is  more common as you become older. Common symptoms of depression include:  Low or sad mood.  Changes in sleep patterns.  Changes in appetite or eating patterns.  Feeling an overall lack of motivation or enjoyment of activities that you previously enjoyed.  Frequent crying spells. Talk with your health care provider if you think that you are experiencing depression. WHAT SHOULD I KNOW ABOUT IMMUNIZATIONS? It is important that you get and maintain your immunizations. These include:  Tetanus, diphtheria, and pertussis (Tdap) booster vaccine.  Influenza every year before the flu season begins.  Pneumonia vaccine.  Shingles vaccine. Your health care provider may also recommend other immunizations.   This information is not intended to replace advice given to you by your health care provider. Make sure you discuss any questions you have with your health care provider.   Document Released: 04/06/2005 Document Revised: 03/05/2014 Document Reviewed: 10/15/2013 Elsevier Interactive Patient Education [employer].

## 2015-11-25 NOTE — Progress Notes (Addendum)
By signing my name below I, Tereasa Coop, attest that this documentation has been prepared under the direction and in the presence of Delman Cheadle, MD. Electonically Signed. Tereasa Coop, Scribe 11/25/2015 at 10:16 AM  Subjective:    Patient ID: Suzanne Duke, female    DOB: [birth year], 55 y.o.   MRN: [id number]  Chief Complaint  Patient presents with  . Annual Exam    w/pap, Flu shot    HPI Suzanne Duke is a 55 y.o. female who presents to the [facility] for her annual physical exam.   Pt was seen and evaluated by Dr Linna Darner a week ago at [facility] for depo provera shot because she has been taking her depo provera shot for the past 25 years. Pt has not had a period for the past 25 years due to the depo provera shot. Pt states her shot this year was delayed for a few months and pt noticed that she did not have a period and was tested for menopause. Upon reviewing pt's labs from a week ago, pt is post-menopausal. Pt denies having any menopausal symptoms including hot flashes.   Pt sees her endocrinologist, Dr Loanne Drilling, regularly for her DM. Pt has been [facility] to the health department for her medical care. Pt would like to establish care at [facility] because she has been struggling with scheduling with the health department. Dr. Loanne Drilling also monitoring pt's enlarged thyroid.  Pt denies checking her BP regularly. Pt occasionally checks her BP at [facility] and is in the 120s/70-83 range. Has not taken her BP medication today.  Pt is scheduled for a mammogram later this year. Pt's mother had a mastectomy at age 53 due to breast cancer. Pt's last Pap smear was a year ago at the health department. Pt denies any history of abnormal pap smears. Pt also denying any vaginal pain, vaginal discharge, urinary symptoms, pelvic pain or discomfort.   Pt states she is currently scheduling her next colonoscopy.   Pt denies any knowledge of liver disorder. Upon viewing pt's history, pt had very mildly  elevated LFTs in 2015 which has since resolved.   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   Immunization History  Administered Date(s) Administered  . Influenza,inj,Quad PF,36+ Mos 01/19/2013, 11/24/2013, 11/08/2014, 11/25/2015  . Influenza-Unspecified 01/27/2011  . PPD Test 04/13/2013, 04/07/2014  . Pneumococcal Polysaccharide-23 01/19/2013  . Td 02/27/2007     Patient Active Problem List   Diagnosis Date Noted  . Liver disorder 02/02/2014  . NASH (nonalcoholic steatohepatitis) 11/28/2013  . Routine general medical examination at a health care facility 11/24/2013  . Right flank pain 04/08/2012  . Arthralgia 08/27/2011  . Left hand pain 08/27/2011  . Encounter for long-term (current) use of other medications 08/27/2011  . GERD 05/11/2010  . BARIATRIC SURGERY STATUS 06/01/2009  . EDEMA 07/21/2008  . Diabetes ([place]) 07/17/2007  . ANEMIA-IRON DEFICIENCY 02/17/2007  . ANXIETY 02/17/2007  . DEPRESSION 02/17/2007  . URI 02/17/2007  . HICCUPS 02/17/2007  . LUMBAR RADICULOPATHY 01/13/2007  . GOITER, NONTOXIC MULTINODULAR 01/07/2007  . UNSPECIFIED NEURALGIA NEURITIS AND RADICULITIS 01/07/2007  . HYPERTENSION 09/21/2006    Current Outpatient Prescriptions on File Prior to Visit  Medication Sig Dispense Refill  . BD PEN NEEDLE NANO U/F 32G X 4 MM MISC USE WITH INSULIN PENS AS DIRECTED 100 each 0  . calcium carbonate (OS-CAL) 600 MG TABS Take 600 mg by mouth 2 (two) times daily.    . Ferrous Sulfate (  IRON) 325 (65 FE) MG TABS Take 1 tablet by mouth 2 (two) times daily.     Marland Kitchen glucose blood (ONETOUCH VERIO) test strip 1 each by Other route 2 (two) times daily. And lancets 2/day 250.03 100 each 12  . glucose blood test strip 1 each by Other route daily. And lancets 1/day 50 each 11  . Insulin Glargine (LANTUS) 100 UNIT/ML Solostar Pen Inject 75 Units into the skin every morning. And pen needles 1/day 30 mL 11  . losartan (COZAAR) 50 MG tablet Take 1 tablet (50 mg total) by mouth daily. 90  tablet 2  . Multiple Vitamin (MULTIVITAMIN) tablet Take 1 tablet by mouth 2 (two) times daily.    . naproxen (NAPROSYN) 500 MG tablet TAKE 1 TABLET BY MOUTH TWICE A DAY WITH MEALS 60 tablet 7  . omeprazole (PRILOSEC) 40 MG capsule TAKE ONE CAPSULE BY MOUTH EVERY DAY 30 capsule 8  . sertraline (ZOLOFT) 50 MG tablet Take 3 tablets (150 mg total) by mouth daily. 270 tablet 1  . vitamin B-12 (CYANOCOBALAMIN) 1000 MCG tablet Take 1,000 mcg by mouth daily.    . medroxyPROGESTERone (DEPO-PROVERA) 150 MG/ML injection Inject 150 mg into the muscle every 3 (three) months.     No current facility-administered medications on file prior to visit.     Allergies  Allergen Reactions  . Sulfonamide Derivatives     Depression screen [facility] 2/9 11/25/2015 11/21/2015  Decreased Interest 0 0  Down, Depressed, Hopeless 0 0  PHQ - 2 Score 0 0    Past Medical History:  Diagnosis Date  . ANEMIA-IRON DEFICIENCY 02/17/2007  . ANXIETY 02/17/2007  . Arthritis   . DEPRESSION 02/17/2007  . DM 07/17/2007  . Edema 07/21/2008  . GERD 05/11/2010  . GOITER, NONTOXIC MULTINODULAR 01/07/2007  . HYPERTENSION 09/21/2006  . LUMBAR RADICULOPATHY 01/13/2007  . Morbid obesity ([place])    lost >200 lbs following bariatric surgery  . UNSPECIFIED NEURALGIA NEURITIS AND RADICULITIS 01/07/2007   Past Surgical History:  Procedure Laterality Date  . CHOLECYSTECTOMY  1992  . ELECTROCARDIOGRAM  08/22/2005  . GASTRIC BYPASS OPEN  2008  . INCISION AND DRAINAGE PERIRECTAL ABSCESS  1989   s/p  . TREATMENT FISTULA ANAL  1993   Family History  Problem Relation Age of Onset  . Cancer Mother     Breast Cancer  . Diabetes Mother   . Renal Disease Mother   . Heart disease Mother   . Hyperlipidemia Mother   . Cancer Sister 67    Colon Cancer  . Hypertension Father   . Cancer Father     Prostate  . Diabetes Father   . Arthritis Father   . Diabetes Sister   . Diabetes Brother   . Hypertension Brother    Social History    Social History  . Marital status: Married    Spouse name: N/A  . Number of children: N/A  . Years of education: N/A   Social History Main Topics  . Smoking status: Current Every Day Smoker    Packs/day: 0.50    Types: Cigarettes  . Smokeless tobacco: Never Used  . Alcohol use No  . Drug use: No  . Sexual activity: Yes    Birth control/ protection: Injection   Other Topics Concern  . None   Social History Narrative  . None      Review of Systems  Constitutional: Negative.   HENT: Negative.   Eyes: Negative.   Respiratory: Negative.   Cardiovascular:  Negative.   Gastrointestinal: Negative.   Genitourinary: Negative.   Musculoskeletal: Negative.   Skin: Negative.   Neurological: Negative.   Psychiatric/Behavioral: Negative.        Objective:   Physical Exam  Constitutional: She is oriented to person, place, and time. She appears well-developed and well-nourished. No distress.  HENT:  Head: Normocephalic and atraumatic.  Right Ear: Tympanic membrane, external ear and ear canal normal.  Left Ear: Tympanic membrane, external ear and ear canal normal.  Nose: Nose normal.  Mouth/Throat: Oropharynx is clear and moist.  Eyes: Conjunctivae and EOM are normal. Pupils are equal, round, and reactive to light.  Neck: Normal range of motion and full passive range of motion without pain. Neck supple. No JVD present. Carotid bruit is not present. Thyromegaly (mild) present.  Cardiovascular: Normal rate, regular rhythm and normal heart sounds.  Exam reveals no gallop and no friction rub.   No murmur heard. Pulses:      Dorsalis pedis pulses are 2+ on the right side, and 2+ on the left side.       Posterior tibial pulses are 2+ on the right side, and 2+ on the left side.  Pulmonary/Chest: Effort normal and breath sounds normal. No accessory muscle usage. She has no decreased breath sounds. She has no wheezes. She has no rhonchi. She has no rales.  Abdominal: Soft. Bowel  sounds are normal. She exhibits no distension and no mass. There is no tenderness. There is no rebound, no guarding and no CVA tenderness.  Genitourinary: No breast swelling, tenderness or discharge.  Musculoskeletal:       Right shoulder: Normal.       Left shoulder: Normal.       Cervical back: Normal.  No lower extremity edema.   Lymphadenopathy:    She has no cervical adenopathy.    She has no axillary adenopathy.  Neurological: She is alert and oriented to person, place, and time. She has normal reflexes. No cranial nerve deficit. She exhibits normal muscle tone. Coordination normal.  Skin: Skin is warm and dry. No rash noted. She is not diaphoretic. No erythema. No pallor.  Psychiatric: She has a normal mood and affect. Her behavior is normal. Judgment and thought content normal.  Nursing note and vitals reviewed.  BP 117/75   Pulse 78   Temp 98.4 F (36.9 C) (Oral)   Ht 5' 5"  (1.651 m)   Wt (!) 320 lb 12.8 oz (145.5 kg)   BMI 53.38 kg/m   Results for orders placed or performed in visit on 11/25/15  Urine culture  Result Value Ref Range   Organism ID, Bacteria      Three or more organisms present,each greater than 10,000 CFU/mL.These organisms,commonly found on external and internal genitalia,are considered to be colonizers.No further testing performed.   Comprehensive metabolic panel  Result Value Ref Range   Sodium 143 135 - 146 mmol/L   Potassium 3.9 3.5 - 5.3 mmol/L   Chloride 105 98 - 110 mmol/L   CO2 23 20 - 31 mmol/L   Glucose, Bld 104 (H) 65 - 99 mg/dL   BUN 9 7 - 25 mg/dL   Creat 0.65 0.50 - 1.05 mg/dL   Total Bilirubin 0.5 0.2 - 1.2 mg/dL   Alkaline Phosphatase 126 33 - 130 U/L   AST 27 10 - 35 U/L   ALT 27 6 - 29 U/L   Total Protein 6.7 6.1 - 8.1 g/dL   Albumin 3.9 3.6 - 5.1 g/dL  Calcium 9.1 8.6 - 10.4 mg/dL  CBC  Result Value Ref Range   WBC 6.7 3.8 - 10.8 K/uL   RBC 4.24 3.80 - 5.10 MIL/uL   Hemoglobin 12.3 11.7 - 15.5 g/dL   HCT 37.3 35.0 -  45.0 %   MCV 88.0 80.0 - 100.0 fL   MCH 29.0 27.0 - 33.0 pg   MCHC 33.0 32.0 - 36.0 g/dL   RDW 13.0 11.0 - 15.0 %   Platelets 274 140 - 400 K/uL   MPV 9.5 7.5 - 12.5 fL  Lipid panel  Result Value Ref Range   Cholesterol 154 125 - 200 mg/dL   Triglycerides 42 <150 mg/dL   HDL 63 >=46 mg/dL   Total CHOL/HDL Ratio 2.4 <=5.0 Ratio   VLDL 8 <30 mg/dL   LDL Cholesterol 83 <130 mg/dL  TSH  Result Value Ref Range   TSH 0.68 mIU/L  POCT urinalysis dipstick  Result Value Ref Range   Color, UA yellow yellow   Clarity, UA cloudy (A) clear   Glucose, UA negative negative   Bilirubin, UA negative negative   Ketones, POC UA negative negative   Spec Grav, UA 1.025    Blood, UA trace-intact (A) negative   pH, UA 6.0    Protein Ur, POC negative negative   Urobilinogen, UA 0.2    Nitrite, UA Positive (A) Negative   Leukocytes, UA Negative Negative  POCT Microscopic Urinalysis ([facility])  Result Value Ref Range   WBC,UR,HPF,POC Many (A) None WBC/hpf   RBC,UR,HPF,POC None None RBC/hpf   Bacteria Many (A) None, Too numerous to count   Mucus Absent Absent   Epithelial Cells, UR Per Microscopy Few (A) None, Too numerous to count cells/hpf        Assessment & Plan:   1. Annual physical exam   2. Flu vaccine need   3. Screening for cardiovascular, respiratory, and genitourinary diseases   4. Screening for deficiency anemia   5. Goiter     Orders Placed This Encounter  Procedures  . Urine culture  . Flu Vaccine QUAD 36+ mos IM  . Comprehensive metabolic panel    Order Specific Question:   Has the patient fasted?    Answer:   Yes  . CBC  . Lipid panel    Order Specific Question:   Has the patient fasted?    Answer:   Yes  . TSH  . Care order/instruction:    AVS printed - let patient go after labs are drawn  . POCT urinalysis dipstick  . POCT Microscopic Urinalysis ([facility])    I personally performed the services described in this documentation, which was scribed in my presence. The  recorded information has been reviewed and considered, and addended by me as needed.   Delman Cheadle, M.D.  [facility] [address] [place], [place] [address] [phone] phone [phone] fax  11/28/15 1:18 PM

## 2015-11-27 LAB — URINE CULTURE

## 2015-11-28 ENCOUNTER — Encounter: Payer: Self-pay | Admitting: Endocrinology

## 2015-11-28 ENCOUNTER — Ambulatory Visit (INDEPENDENT_AMBULATORY_CARE_PROVIDER_SITE_OTHER): Payer: BLUE CROSS/BLUE SHIELD | Admitting: Endocrinology

## 2015-11-28 ENCOUNTER — Ambulatory Visit: Payer: BLUE CROSS/BLUE SHIELD | Admitting: Endocrinology

## 2015-11-28 VITALS — BP 138/80 | HR 80 | Ht 65.0 in | Wt 321.0 lb

## 2015-11-28 DIAGNOSIS — E103299 Type 1 diabetes mellitus with mild nonproliferative diabetic retinopathy without macular edema, unspecified eye: Secondary | ICD-10-CM | POA: Diagnosis not present

## 2015-11-28 DIAGNOSIS — Z0000 Encounter for general adult medical examination without abnormal findings: Secondary | ICD-10-CM

## 2015-11-28 LAB — POCT GLYCOSYLATED HEMOGLOBIN (HGB A1C): HEMOGLOBIN A1C: 7.2

## 2015-11-28 NOTE — Progress Notes (Signed)
Subjective:    Patient ID: Suzanne Duke, female    DOB: [birth year], 55 y.o.   MRN: [id number]  HPI Pt returns for f/u of diabetes mellitus: DM type: insulin-requiring type 2 Dx'ed:1992, during a pregnancy, but it persisted after the pregnancy Complications: non-proliferative retinopathy. Therapy: insulin since 2014. DKA: never Severe hypoglycemia: last episode was 2009.   Pancreatitis: never Other: she took insulin until she had bariatric surgery (gastric bypass) in 2008; she weighted 492 prior to the surgery (she declines repeat surgery); she was then controlled with orals until mid-2014, when she needed to resume the insulin; she declines multiple daily injections.   Interval history: no cbg record, but states cbg's are well-controlled.  pt states she feels well in general.  She takes 75 units qd.   Past Medical History:  Diagnosis Date  . ANEMIA-IRON DEFICIENCY 02/17/2007  . ANXIETY 02/17/2007  . Arthritis   . DEPRESSION 02/17/2007  . DM 07/17/2007  . Edema 07/21/2008  . GERD 05/11/2010  . GOITER, NONTOXIC MULTINODULAR 01/07/2007  . HYPERTENSION 09/21/2006  . LUMBAR RADICULOPATHY 01/13/2007  . Morbid obesity ([place])    lost >200 lbs following bariatric surgery  . UNSPECIFIED NEURALGIA NEURITIS AND RADICULITIS 01/07/2007    Past Surgical History:  Procedure Laterality Date  . CHOLECYSTECTOMY  1992  . ELECTROCARDIOGRAM  08/22/2005  . GASTRIC BYPASS OPEN  2008  . INCISION AND DRAINAGE PERIRECTAL ABSCESS  1989   s/p  . TREATMENT FISTULA ANAL  1993    Social History   Social History  . Marital status: Married    Spouse name: N/A  . Number of children: N/A  . Years of education: N/A   Occupational History  . Not on file.   Social History Main Topics  . Smoking status: Current Every Day Smoker    Packs/day: 0.50    Types: Cigarettes  . Smokeless tobacco: Never Used  . Alcohol use No  . Drug use: No  . Sexual activity: Yes    Birth control/ protection: Injection     Other Topics Concern  . Not on file   Social History Narrative  . No narrative on file    Current Outpatient Prescriptions on File Prior to Visit  Medication Sig Dispense Refill  . BD PEN NEEDLE NANO U/F 32G X 4 MM MISC USE WITH INSULIN PENS AS DIRECTED 100 each 0  . calcium carbonate (OS-CAL) 600 MG TABS Take 600 mg by mouth 2 (two) times daily.    . Ferrous Sulfate (IRON) 325 (65 FE) MG TABS Take 1 tablet by mouth 2 (two) times daily.     Marland Kitchen glucose blood (ONETOUCH VERIO) test strip 1 each by Other route 2 (two) times daily. And lancets 2/day 250.03 100 each 12  . glucose blood test strip 1 each by Other route daily. And lancets 1/day 50 each 11  . Insulin Glargine (LANTUS) 100 UNIT/ML Solostar Pen Inject 75 Units into the skin every morning. And pen needles 1/day 30 mL 11  . losartan (COZAAR) 50 MG tablet Take 1 tablet (50 mg total) by mouth daily. 90 tablet 2  . medroxyPROGESTERone (DEPO-PROVERA) 150 MG/ML injection Inject 150 mg into the muscle every 3 (three) months.    . Multiple Vitamin (MULTIVITAMIN) tablet Take 1 tablet by mouth 2 (two) times daily.    . naproxen (NAPROSYN) 500 MG tablet TAKE 1 TABLET BY MOUTH TWICE A DAY WITH MEALS 60 tablet 7  . omeprazole (PRILOSEC) 40 MG capsule TAKE ONE CAPSULE  BY MOUTH EVERY DAY 30 capsule 8  . sertraline (ZOLOFT) 50 MG tablet Take 3 tablets (150 mg total) by mouth daily. 270 tablet 1  . vitamin B-12 (CYANOCOBALAMIN) 1000 MCG tablet Take 1,000 mcg by mouth daily.     No current facility-administered medications on file prior to visit.     Allergies  Allergen Reactions  . Sulfonamide Derivatives     Family History  Problem Relation Age of Onset  . Cancer Mother     Breast Cancer  . Diabetes Mother   . Renal Disease Mother   . Heart disease Mother   . Hyperlipidemia Mother   . Cancer Sister 17    Colon Cancer  . Hypertension Father   . Cancer Father     Prostate  . Diabetes Father   . Arthritis Father   . Diabetes Sister    . Diabetes Brother   . Hypertension Brother     BP 138/80   Pulse 80   Ht 5' 5"  (1.651 m)   Wt (!) 321 lb (145.6 kg)   SpO2 98%   BMI 53.42 kg/m    Review of Systems She denies hypoglycemia.  she has lost a few lbs.      Objective:   Physical Exam VITAL SIGNS:  See vs page GENERAL: no distress Pulses: dorsalis pedis intact bilat.   MSK: no deformity of the feet.   CV: 1+ bilat leg edema.   Skin:  no ulcer on the feet, but the skin is dry.  normal color and temp on the feet.  Neuro: sensation is intact to touch on the feet.  Ext: There is bilateral onychomycosis of the toenails, and bilat calluses.   Lab Results  Component Value Date   HGBA1C 7.2 11/28/2015      Assessment & Plan:  Insulin-requiring type 2 DM: she needs increased rx.  Patient is advised the following:  Patient Instructions  Please continue the same insulin.   Please consider having another weight loss surgery.  It is good for your health.  Here is some information about it.  If you decide to consider further, please call the phone number in the papers, and register for a free informational meeting Please come back for a follow-up appointment in 4 months.   check your blood sugar twice a day.  vary the time of day when you check, between before the 3 meals, and at bedtime.  also check if you have symptoms of your blood sugar being too high or too low.  please keep a record of the readings and bring it to your next appointment here (or you can bring the meter itself).  You can write it on any piece of paper.  please call us sooner if your blood sugar goes below 70, or if you have a lot of readings over 200.

## 2015-11-28 NOTE — Patient Instructions (Addendum)
Please continue the same insulin.   Please consider having another weight loss surgery.  It is good for your health.  Here is some information about it.  If you decide to consider further, please call the phone number in the papers, and register for a free informational meeting Please come back for a follow-up appointment in 4 months.   check your blood sugar twice a day.  vary the time of day when you check, between before the 3 meals, and at bedtime.  also check if you have symptoms of your blood sugar being too high or too low.  please keep a record of the readings and bring it to your next appointment here (or you can bring the meter itself).  You can write it on any piece of paper.  please call us sooner if your blood sugar goes below 70, or if you have a lot of readings over 200.

## 2015-12-06 ENCOUNTER — Telehealth: Payer: Self-pay | Admitting: Endocrinology

## 2015-12-06 NOTE — Telephone Encounter (Signed)
Pt called and said she got your message through MyChart, but she is having a hard time getting into it.  She requests call back to discuss what Dr. Loanne Drilling was wanting her to know.

## 2015-12-06 NOTE — Telephone Encounter (Signed)
I contacted the patient and advised I reviewed her chart and could not locate any recent notes from Dr. Loanne Drilling sent to her my chart account. Patient voiced understanding and stated she had no further questions at this time,

## 2015-12-10 ENCOUNTER — Other Ambulatory Visit: Payer: Self-pay | Admitting: Endocrinology

## 2015-12-10 NOTE — Telephone Encounter (Signed)
Please refer to PCP

## 2015-12-12 ENCOUNTER — Telehealth: Payer: Self-pay

## 2015-12-12 ENCOUNTER — Other Ambulatory Visit: Payer: Self-pay | Admitting: Endocrinology

## 2015-12-12 ENCOUNTER — Telehealth: Payer: Self-pay | Admitting: Endocrinology

## 2015-12-12 NOTE — Telephone Encounter (Signed)
I contacted the patient and she wanted to verify why the Omeprazole was denied. Patient was advised Dr. Loanne Drilling wanted her to request this medication from her PCP. Patient voiced understanding.

## 2015-12-12 NOTE — Telephone Encounter (Signed)
Pt called in requesting to speak to [facility]  No other information was given, requests call back.

## 2015-12-12 NOTE — Telephone Encounter (Signed)
Pt would like her omeprazone refilled   Her contact number is [phone]

## 2015-12-12 NOTE — Telephone Encounter (Signed)
Pt would like her omeprazole filled   Her contact number is [phone]

## 2015-12-16 ENCOUNTER — Other Ambulatory Visit: Payer: Self-pay

## 2015-12-16 MED ORDER — OMEPRAZOLE 40 MG PO CPDR: 40.0000 mg | DELAYED_RELEASE_CAPSULE | Freq: Every day | ORAL | 8 refills | Status: DC

## 2015-12-16 NOTE — Telephone Encounter (Signed)
Dr Brigitte Pulse, you just saw pt for CPE in Sept, but it looks like Dr Renato Shin has always Rxd this for pt in the past. Do you want to give RFs?

## 2016-01-03 ENCOUNTER — Encounter: Payer: Self-pay | Admitting: Endocrinology

## 2016-01-11 LAB — COLOGUARD: COLOGUARD: NEGATIVE

## 2016-01-25 ENCOUNTER — Telehealth: Payer: Self-pay | Admitting: Endocrinology

## 2016-01-25 NOTE — Telephone Encounter (Signed)
Pt is asking for results of the cologuard test please

## 2016-01-25 NOTE — Telephone Encounter (Signed)
I contacted the patient and advised of Cologuard results were normal. Pateint voiced understanding and form submitted to be scanned into the patient's chart.

## 2016-02-10 ENCOUNTER — Other Ambulatory Visit: Payer: Self-pay | Admitting: Endocrinology

## 2016-03-02 ENCOUNTER — Other Ambulatory Visit: Payer: Self-pay | Admitting: Endocrinology

## 2016-03-30 ENCOUNTER — Encounter: Payer: Self-pay | Admitting: Endocrinology

## 2016-03-30 ENCOUNTER — Ambulatory Visit (INDEPENDENT_AMBULATORY_CARE_PROVIDER_SITE_OTHER): Payer: BLUE CROSS/BLUE SHIELD | Admitting: Endocrinology

## 2016-03-30 VITALS — BP 136/84 | HR 92 | Ht 65.0 in | Wt 315.0 lb

## 2016-03-30 DIAGNOSIS — E103299 Type 1 diabetes mellitus with mild nonproliferative diabetic retinopathy without macular edema, unspecified eye: Secondary | ICD-10-CM | POA: Diagnosis not present

## 2016-03-30 LAB — POCT GLYCOSYLATED HEMOGLOBIN (HGB A1C): Hemoglobin A1C: 6.4

## 2016-03-30 MED ORDER — INSULIN GLARGINE 100 UNIT/ML SOLOSTAR PEN: 65.0000 [IU] | PEN_INJECTOR | SUBCUTANEOUS | 11 refills | Status: DC

## 2016-03-30 NOTE — Patient Instructions (Addendum)
Please reduce the insulin to 65 units each morning.  Please come back for a follow-up appointment in 4 months.   check your blood sugar twice a day.  vary the time of day when you check, between before the 3 meals, and at bedtime.  also check if you have symptoms of your blood sugar being too high or too low.  please keep a record of the readings and bring it to your next appointment here (or you can bring the meter itself).  You can write it on any piece of paper.  please call us sooner if your blood sugar goes below 70, or if you have a lot of readings over 200.

## 2016-03-30 NOTE — Progress Notes (Signed)
Subjective:    Patient ID: Suzanne Duke, female    DOB: 18-Aug-1960, 56 y.o.   MRN: [id number]  HPI Pt returns for f/u of diabetes mellitus: DM type: insulin-requiring type 2 Dx'ed:1992, during a pregnancy, but it persisted after the pregnancy Complications: non-proliferative retinopathy. Therapy: insulin since 2014. DKA: never Severe hypoglycemia: last episode was 2009.   Pancreatitis: never Other: she took insulin until she had bariatric surgery (gastric bypass) in 2008; she weighted 492 prior to the surgery (she declines repeat surgery); she was then controlled with orals until mid-2014, when she needed to resume the insulin; she declines multiple daily injections.   Interval history: no cbg record, but states cbg's are well-controlled.  pt states she feels well in general.  She takes 75 units qd.   Past Medical History:  Diagnosis Date  . ANEMIA-IRON DEFICIENCY 02/17/2007  . ANXIETY 02/17/2007  . Arthritis   . DEPRESSION 02/17/2007  . DM 07/17/2007  . Edema 07/21/2008  . GERD 05/11/2010  . GOITER, NONTOXIC MULTINODULAR 01/07/2007  . HYPERTENSION 09/21/2006  . LUMBAR RADICULOPATHY 01/13/2007  . Morbid obesity ([place])    lost >200 lbs following bariatric surgery  . UNSPECIFIED NEURALGIA NEURITIS AND RADICULITIS 01/07/2007    Past Surgical History:  Procedure Laterality Date  . CHOLECYSTECTOMY  1992  . ELECTROCARDIOGRAM  08/22/2005  . GASTRIC BYPASS OPEN  2008  . INCISION AND DRAINAGE PERIRECTAL ABSCESS  1989   s/p  . TREATMENT FISTULA ANAL  1993    Social History   Social History  . Marital status: Married    Spouse name: N/A  . Number of children: N/A  . Years of education: N/A   Occupational History  . Not on file.   Social History Main Topics  . Smoking status: Current Every Day Smoker    Packs/day: 0.50    Types: Cigarettes  . Smokeless tobacco: Never Used  . Alcohol use No  . Drug use: No  . Sexual activity: Yes    Birth control/ protection: Injection     Other Topics Concern  . Not on file   Social History Narrative  . No narrative on file    Current Outpatient Prescriptions on File Prior to Visit  Medication Sig Dispense Refill  . BD PEN NEEDLE NANO U/F 32G X 4 MM MISC USE WITH INSULIN PENS AS DIRECTED 100 each 0  . calcium carbonate (OS-CAL) 600 MG TABS Take 600 mg by mouth 2 (two) times daily.    . Ferrous Sulfate (IRON) 325 (65 FE) MG TABS Take 1 tablet by mouth 2 (two) times daily.     Marland Kitchen glucose blood (ONETOUCH VERIO) test strip 1 each by Other route 2 (two) times daily. And lancets 2/day 250.03 100 each 12  . glucose blood test strip 1 each by Other route daily. And lancets 1/day 50 each 11  . losartan (COZAAR) 50 MG tablet TAKE 1 TABLET BY MOUTH EVERY DAY 90 tablet 2  . Multiple Vitamin (MULTIVITAMIN) tablet Take 1 tablet by mouth 2 (two) times daily.    . naproxen (NAPROSYN) 500 MG tablet TAKE 1 TABLET BY MOUTH TWICE A DAY WITH MEALS 60 tablet 4  . omeprazole (PRILOSEC) 40 MG capsule Take 1 capsule (40 mg total) by mouth daily. 30 capsule 8  . omeprazole (PRILOSEC) 40 MG capsule TAKE ONE CAPSULE BY MOUTH EVERY DAY 30 capsule 7  . sertraline (ZOLOFT) 50 MG tablet Take 3 tablets (150 mg total) by mouth daily. 270 tablet 1  .  vitamin B-12 (CYANOCOBALAMIN) 1000 MCG tablet Take 1,000 mcg by mouth daily.     No current facility-administered medications on file prior to visit.     Allergies  Allergen Reactions  . Sulfonamide Derivatives     Family History  Problem Relation Age of Onset  . Cancer Mother     Breast Cancer  . Diabetes Mother   . Renal Disease Mother   . Heart disease Mother   . Hyperlipidemia Mother   . Cancer Sister 29    Colon Cancer  . Hypertension Father   . Cancer Father     Prostate  . Diabetes Father   . Arthritis Father   . Diabetes Sister   . Diabetes Brother   . Hypertension Brother    BP 136/84   Pulse 92   Ht 5' 5"  (1.651 m)   Wt (!) 315 lb (142.9 kg)   SpO2 97%   BMI 52.42 kg/m    Review of Systems She denies hypoglycemia.  She has lost 6 lbs since last ov.      Objective:   Physical Exam VITAL SIGNS:  See vs page GENERAL: no distress Pulses: dorsalis pedis intact bilat.   MSK: no deformity of the feet.   CV: 1+ bilat leg edema.   Skin:  no ulcer on the feet, but the skin is dry.  normal color and temp on the feet.  Neuro: sensation is intact to touch on the feet.  Ext: There is bilateral onychomycosis of the toenails, and bilat calluses.   A1c=6.4%    Assessment & Plan:  Insulin-requiring type 2 DM, with retinopathy: overcontrolled, given this regimen, which does match insulin to her changing needs throughout the day.    Patient is advised the following: Patient Instructions  Please reduce the insulin to 65 units each morning.  Please come back for a follow-up appointment in 4 months.   check your blood sugar twice a day.  vary the time of day when you check, between before the 3 meals, and at bedtime.  also check if you have symptoms of your blood sugar being too high or too low.  please keep a record of the readings and bring it to your next appointment here (or you can bring the meter itself).  You can write it on any piece of paper.  please call us sooner if your blood sugar goes below 70, or if you have a lot of readings over 200.

## 2016-06-26 IMAGING — DX DG CHEST 2V
2 series · 2 of 2 positions shown · non-contrast
Comparison: None in PACs

CLINICAL DATA: Cough, chest congestion, and chest tightness for the
past 2 weeks

EXAM:
CHEST  2 VIEW

[chest pa]
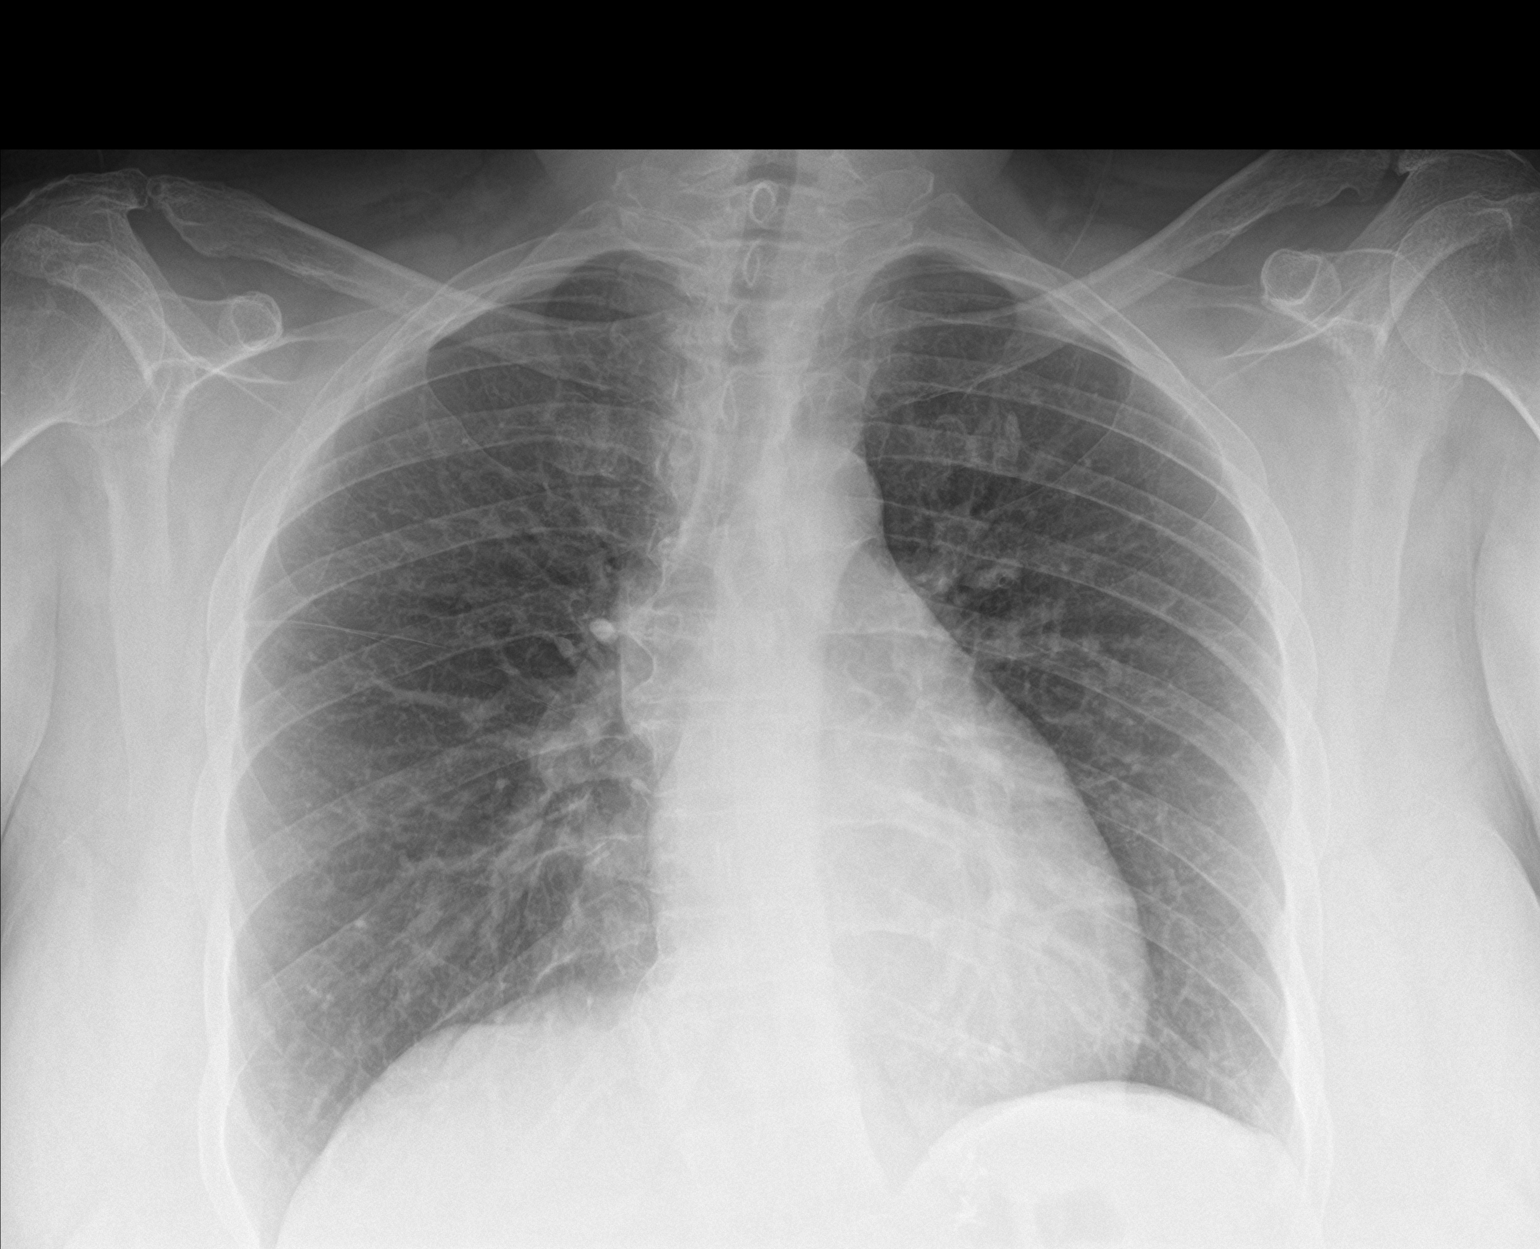

[chest lat]
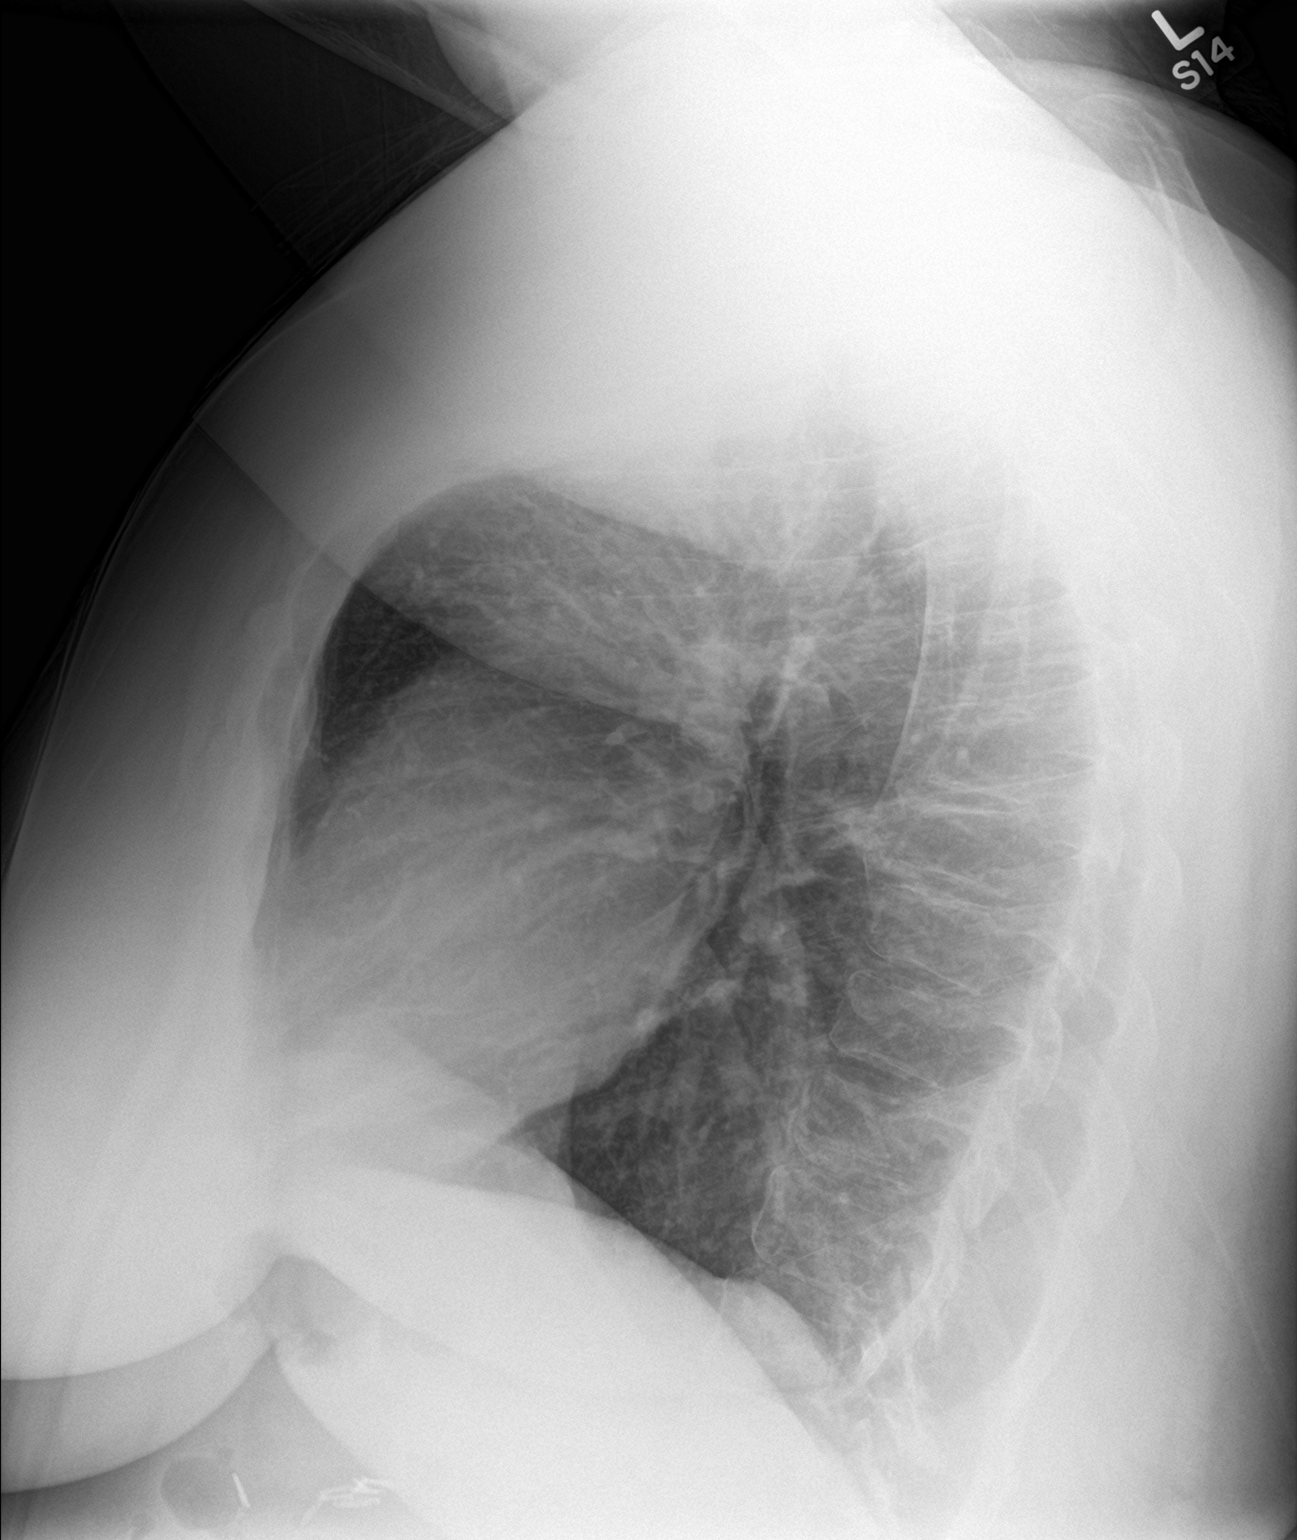

[2 of 2 positions shown; findings below may reference images not displayed]

FINDINGS: The lungs are well-expanded. The interstitial markings are coarse.
The heart is normal in size. The pulmonary vascularity is not
engorged. The mediastinum is normal in width. There is no pleural
effusion. There is mild multilevel degenerative disc disease and
gentle dextrocurvature of the thoracic spine.
IMPRESSION: Mild hyperinflation with mild interstitial prominence may reflect
underlying COPD -chronic bronchitis. There is no evidence of
pneumonia nor CHF.

## 2016-07-30 ENCOUNTER — Ambulatory Visit (INDEPENDENT_AMBULATORY_CARE_PROVIDER_SITE_OTHER): Payer: BLUE CROSS/BLUE SHIELD | Admitting: Endocrinology

## 2016-07-30 VITALS — BP 140/72 | HR 97 | Wt 326.8 lb

## 2016-07-30 DIAGNOSIS — E119 Type 2 diabetes mellitus without complications: Secondary | ICD-10-CM | POA: Diagnosis not present

## 2016-07-30 LAB — POCT GLYCOSYLATED HEMOGLOBIN (HGB A1C): Hemoglobin A1C: 6.6

## 2016-07-30 MED ORDER — INSULIN GLARGINE 100 UNIT/ML SOLOSTAR PEN: 55.0000 [IU] | PEN_INJECTOR | SUBCUTANEOUS | 11 refills | Status: DC

## 2016-07-30 NOTE — Patient Instructions (Addendum)
Please reduce the insulin to 55 units each morning.   Please come back for a follow-up appointment in 4 months.   check your blood sugar twice a day.  vary the time of day when you check, between before the 3 meals, and at bedtime.  also check if you have symptoms of your blood sugar being too high or too low.  please keep a record of the readings and bring it to your next appointment here (or you can bring the meter itself).  You can write it on any piece of paper.  please call us sooner if your blood sugar goes below 70, or if you have a lot of readings over 200.

## 2016-07-30 NOTE — Progress Notes (Signed)
Subjective:    Patient ID: Suzanne Duke, female    DOB: 1961-02-26, 56 y.o.   MRN: [id number]  HPI Pt returns for f/u of diabetes mellitus: DM type: insulin-requiring type 2 Dx'ed:1992, during a pregnancy, but it persisted after the pregnancy Complications: non-proliferative retinopathy. Therapy: insulin since 2014. DKA: never Severe hypoglycemia: last episode was 2009.   Pancreatitis: never Other: she took insulin until she had bariatric surgery (gastric bypass) in 2008; she weighted 492 prior to the surgery (she declines repeat surgery); she was then controlled with orals until mid-2014, when she needed to resume the insulin; she declines multiple daily injections.   Interval history: no cbg record, but states cbg's are well-controlled.  She takes 65 units qd.  Since last ov, she has had only 1 episode of hypoglycemia, and this was mild.  This happened after a missed meal.   Past Medical History:  Diagnosis Date  . ANEMIA-IRON DEFICIENCY 02/17/2007  . ANXIETY 02/17/2007  . Arthritis   . DEPRESSION 02/17/2007  . DM 07/17/2007  . Edema 07/21/2008  . GERD 05/11/2010  . GOITER, NONTOXIC MULTINODULAR 01/07/2007  . HYPERTENSION 09/21/2006  . LUMBAR RADICULOPATHY 01/13/2007  . Morbid obesity ([place])    lost >200 lbs following bariatric surgery  . UNSPECIFIED NEURALGIA NEURITIS AND RADICULITIS 01/07/2007    Past Surgical History:  Procedure Laterality Date  . CHOLECYSTECTOMY  1992  . ELECTROCARDIOGRAM  08/22/2005  . GASTRIC BYPASS OPEN  2008  . INCISION AND DRAINAGE PERIRECTAL ABSCESS  1989   s/p  . TREATMENT FISTULA ANAL  1993    Social History   Social History  . Marital status: Married    Spouse name: N/A  . Number of children: N/A  . Years of education: N/A   Occupational History  . Not on file.   Social History Main Topics  . Smoking status: Current Every Day Smoker    Packs/day: 0.50    Types: Cigarettes  . Smokeless tobacco: Never Used  . Alcohol use No  .  Drug use: No  . Sexual activity: Yes    Birth control/ protection: Injection   Other Topics Concern  . Not on file   Social History Narrative  . No narrative on file    Current Outpatient Prescriptions on File Prior to Visit  Medication Sig Dispense Refill  . BD PEN NEEDLE NANO U/F 32G X 4 MM MISC USE WITH INSULIN PENS AS DIRECTED 100 each 0  . calcium carbonate (OS-CAL) 600 MG TABS Take 600 mg by mouth 2 (two) times daily.    . Ferrous Sulfate (IRON) 325 (65 FE) MG TABS Take 1 tablet by mouth 2 (two) times daily.     Marland Kitchen glucose blood (ONETOUCH VERIO) test strip 1 each by Other route 2 (two) times daily. And lancets 2/day 250.03 100 each 12  . glucose blood test strip 1 each by Other route daily. And lancets 1/day 50 each 11  . losartan (COZAAR) 50 MG tablet TAKE 1 TABLET BY MOUTH EVERY DAY 90 tablet 2  . Multiple Vitamin (MULTIVITAMIN) tablet Take 1 tablet by mouth 2 (two) times daily.    . naproxen (NAPROSYN) 500 MG tablet TAKE 1 TABLET BY MOUTH TWICE A DAY WITH MEALS 60 tablet 4  . omeprazole (PRILOSEC) 40 MG capsule Take 1 capsule (40 mg total) by mouth daily. 30 capsule 8  . omeprazole (PRILOSEC) 40 MG capsule TAKE ONE CAPSULE BY MOUTH EVERY DAY 30 capsule 7  . sertraline (ZOLOFT) 50  MG tablet Take 3 tablets (150 mg total) by mouth daily. 270 tablet 1  . vitamin B-12 (CYANOCOBALAMIN) 1000 MCG tablet Take 1,000 mcg by mouth daily.     No current facility-administered medications on file prior to visit.     Allergies  Allergen Reactions  . Sulfonamide Derivatives     Family History  Problem Relation Age of Onset  . Cancer Mother        Breast Cancer  . Diabetes Mother   . Renal Disease Mother   . Heart disease Mother   . Hyperlipidemia Mother   . Cancer Sister 64       Colon Cancer  . Hypertension Father   . Cancer Father        Prostate  . Diabetes Father   . Arthritis Father   . Diabetes Sister   . Diabetes Brother   . Hypertension Brother     BP 140/72 (BP  Location: Left Arm, Patient Position: Sitting, Cuff Size: Normal)   Pulse 97   Wt (!) 326 lb 12.8 oz (148.2 kg)   SpO2 97%   BMI 54.38 kg/m    Review of Systems She denies LOC.      Objective:   Physical Exam VITAL SIGNS:  See vs page GENERAL: no distress Pulses: dorsalis pedis intact bilat.   MSK: no deformity of the feet.   CV: 1+ bilat leg edema.   Skin:  no ulcer on the feet, but the skin is dry.  normal color and temp on the feet.  Neuro: sensation is intact to touch on the feet.  Ext: There is bilateral onychomycosis of the toenails, and bilat calluses.   Lab Results  Component Value Date   HGBA1C 6.6 07/30/2016      Assessment & Plan:  Insulin-requiring type 2 DM, with NPDR: overcontrolled, given this regimen, which does match insulin to her changing needs throughout the day.   Patient Instructions  Please reduce the insulin to 55 units each morning.   Please come back for a follow-up appointment in 4 months.   check your blood sugar twice a day.  vary the time of day when you check, between before the 3 meals, and at bedtime.  also check if you have symptoms of your blood sugar being too high or too low.  please keep a record of the readings and bring it to your next appointment here (or you can bring the meter itself).  You can write it on any piece of paper.  please call us sooner if your blood sugar goes below 70, or if you have a lot of readings over 200.

## 2016-08-01 ENCOUNTER — Other Ambulatory Visit: Payer: Self-pay | Admitting: Endocrinology

## 2016-08-15 LAB — HM DIABETES EYE EXAM

## 2016-08-30 ENCOUNTER — Other Ambulatory Visit: Payer: Self-pay | Admitting: Endocrinology

## 2016-09-04 ENCOUNTER — Other Ambulatory Visit: Payer: Self-pay

## 2016-09-04 MED ORDER — OMEPRAZOLE 40 MG PO CPDR
40.0000 mg | DELAYED_RELEASE_CAPSULE | Freq: Every day | ORAL | 2 refills | Status: DC
Start: 2016-09-04 — End: 2016-12-14

## 2016-09-04 MED ORDER — NAPROXEN 500 MG PO TABS: 500.0000 mg | ORAL_TABLET | Freq: Two times a day (BID) | ORAL | 4 refills | Status: DC

## 2016-09-28 ENCOUNTER — Telehealth: Payer: Self-pay | Admitting: Endocrinology

## 2016-09-28 NOTE — Telephone Encounter (Signed)
error 

## 2016-10-01 ENCOUNTER — Telehealth: Payer: Self-pay | Admitting: Endocrinology

## 2016-10-01 NOTE — Telephone Encounter (Signed)
Patient would like a call back RE letter from [employer] in reference to her cologaurd test. Says that she did not pay for the Cologaurd test.   Please advise.  Thank you,  -LL

## 2016-10-01 NOTE — Telephone Encounter (Signed)
Patient calling back about Cologaurd fee.   Please advise.  -LL

## 2016-10-01 NOTE — Telephone Encounter (Signed)
Routing to you °

## 2016-10-02 NOTE — Telephone Encounter (Signed)
I am unsure how to answer Suzanne Duke's question about her Cologuard fee? Would you be able to help with this?

## 2016-10-02 NOTE — Telephone Encounter (Signed)
Pt has not called [employer] yet to see why they did not charge insurance she is doing that now

## 2016-10-31 ENCOUNTER — Ambulatory Visit: Payer: BLUE CROSS/BLUE SHIELD | Admitting: Endocrinology

## 2016-11-28 ENCOUNTER — Ambulatory Visit (INDEPENDENT_AMBULATORY_CARE_PROVIDER_SITE_OTHER): Payer: BLUE CROSS/BLUE SHIELD | Admitting: Endocrinology

## 2016-11-28 ENCOUNTER — Encounter: Payer: Self-pay | Admitting: Endocrinology

## 2016-11-28 VITALS — BP 118/86 | HR 95 | Wt 324.8 lb

## 2016-11-28 DIAGNOSIS — Z23 Encounter for immunization: Secondary | ICD-10-CM | POA: Diagnosis not present

## 2016-11-28 DIAGNOSIS — E103299 Type 1 diabetes mellitus with mild nonproliferative diabetic retinopathy without macular edema, unspecified eye: Secondary | ICD-10-CM

## 2016-11-28 LAB — POCT GLYCOSYLATED HEMOGLOBIN (HGB A1C): HEMOGLOBIN A1C: 6.7

## 2016-11-28 MED ORDER — INSULIN GLARGINE 100 UNIT/ML SOLOSTAR PEN: 50.0000 [IU] | PEN_INJECTOR | SUBCUTANEOUS | 11 refills | Status: DC

## 2016-11-28 NOTE — Progress Notes (Signed)
Subjective:    Patient ID: Suzanne Duke, female    DOB: 1961-01-03, 56 y.o.   MRN: [id number]  HPI Pt returns for f/u of diabetes mellitus: DM type: insulin-requiring type 2 Dx'ed:1992, during a pregnancy, but it persisted after the pregnancy.  Complications: non-proliferative retinopathy. Therapy: insulin since 2014. DKA: never Severe hypoglycemia: last episode was 2009.   Pancreatitis: never.  Other: she took insulin until she had bariatric surgery (gastric bypass) in 2008; she weighed 492 prior to the surgery (she declines repeat surgery); she was then controlled with orals until mid-2014, when she needed to resume the insulin; she declines multiple daily injections.   Interval history: no cbg record, but states cbg's are well-controlled.  She takes 55 units qd.  Since last ov, she has had only 1 episode of hypoglycemia, and this was mild.  This happened after a missed meal.    Past Medical History:  Diagnosis Date  . ANEMIA-IRON DEFICIENCY 02/17/2007  . ANXIETY 02/17/2007  . Arthritis   . DEPRESSION 02/17/2007  . DM 07/17/2007  . Edema 07/21/2008  . GERD 05/11/2010  . GOITER, NONTOXIC MULTINODULAR 01/07/2007  . HYPERTENSION 09/21/2006  . LUMBAR RADICULOPATHY 01/13/2007  . Morbid obesity ([place])    lost >200 lbs following bariatric surgery  . UNSPECIFIED NEURALGIA NEURITIS AND RADICULITIS 01/07/2007    Past Surgical History:  Procedure Laterality Date  . CHOLECYSTECTOMY  1992  . ELECTROCARDIOGRAM  08/22/2005  . GASTRIC BYPASS OPEN  2008  . INCISION AND DRAINAGE PERIRECTAL ABSCESS  1989   s/p  . TREATMENT FISTULA ANAL  1993    Social History   Social History  . Marital status: Married    Spouse name: N/A  . Number of children: N/A  . Years of education: N/A   Occupational History  . Not on file.   Social History Main Topics  . Smoking status: Current Every Day Smoker    Packs/day: 0.50    Types: Cigarettes  . Smokeless tobacco: Never Used  . Alcohol use No  .  Drug use: No  . Sexual activity: Yes    Birth control/ protection: Injection   Other Topics Concern  . Not on file   Social History Narrative  . No narrative on file    Current Outpatient Prescriptions on File Prior to Visit  Medication Sig Dispense Refill  . BD PEN NEEDLE NANO U/F 32G X 4 MM MISC USE WITH INSULIN PENS AS DIRECTED 100 each 0  . calcium carbonate (OS-CAL) 600 MG TABS Take 600 mg by mouth 2 (two) times daily.    . Ferrous Sulfate (IRON) 325 (65 FE) MG TABS Take 1 tablet by mouth 2 (two) times daily.     Marland Kitchen glucose blood (ONETOUCH VERIO) test strip 1 each by Other route 2 (two) times daily. And lancets 2/day 250.03 100 each 12  . glucose blood test strip 1 each by Other route daily. And lancets 1/day 50 each 11  . losartan (COZAAR) 50 MG tablet TAKE 1 TABLET BY MOUTH EVERY DAY 90 tablet 2  . Multiple Vitamin (MULTIVITAMIN) tablet Take 1 tablet by mouth 2 (two) times daily.    . naproxen (NAPROSYN) 500 MG tablet Take 1 tablet (500 mg total) by mouth 2 (two) times daily with a meal. 180 tablet 4  . omeprazole (PRILOSEC) 40 MG capsule Take 1 capsule (40 mg total) by mouth daily. 90 capsule 2  . sertraline (ZOLOFT) 50 MG tablet TAKE 3 TABLETS BY MOUTH EVERY DAY  270 tablet 1  . vitamin B-12 (CYANOCOBALAMIN) 1000 MCG tablet Take 1,000 mcg by mouth daily.     No current facility-administered medications on file prior to visit.     Allergies  Allergen Reactions  . Sulfonamide Derivatives     Family History  Problem Relation Age of Onset  . Cancer Mother        Breast Cancer  . Diabetes Mother   . Renal Disease Mother   . Heart disease Mother   . Hyperlipidemia Mother   . Cancer Sister 67       Colon Cancer  . Hypertension Father   . Cancer Father        Prostate  . Diabetes Father   . Arthritis Father   . Diabetes Sister   . Diabetes Brother   . Hypertension Brother     BP 118/86   Pulse 95   Wt (!) 324 lb 12.8 oz (147.3 kg)   SpO2 98%   BMI 54.05 kg/m      Review of Systems She denies hypoglycemia.      Objective:   Physical Exam VITAL SIGNS:  See vs page GENERAL: no distress Pulses: dorsalis pedis intact bilat.   MSK: no deformity of the feet.   CV: trace bilat leg edema.   Skin:  no ulcer on the feet, but the skin is dry.  normal color and temp on the feet.  Neuro: sensation is intact to touch on the feet.  Ext: There is bilateral onychomycosis of the toenails, and bilat calluses.     A1c=6.7%    Assessment & Plan:  Insulin-requiring type 2 DM: slightly overcontrolled, given this regimen, which does match insulin to her changing needs throughout the day  Patient Instructions  Please reduce the insulin to 50 units each morning.   Please come back for a follow-up appointment in 4 months.   check your blood sugar twice a day.  vary the time of day when you check, between before the 3 meals, and at bedtime.  also check if you have symptoms of your blood sugar being too high or too low.  please keep a record of the readings and bring it to your next appointment here (or you can bring the meter itself).  You can write it on any piece of paper.  please call us sooner if your blood sugar goes below 70, or if you have a lot of readings over 200.

## 2016-11-28 NOTE — Patient Instructions (Signed)
Please reduce the insulin to 50 units each morning.   Please come back for a follow-up appointment in 4 months.   check your blood sugar twice a day.  vary the time of day when you check, between before the 3 meals, and at bedtime.  also check if you have symptoms of your blood sugar being too high or too low.  please keep a record of the readings and bring it to your next appointment here (or you can bring the meter itself).  You can write it on any piece of paper.  please call us sooner if your blood sugar goes below 70, or if you have a lot of readings over 200.

## 2016-12-13 ENCOUNTER — Telehealth: Payer: Self-pay | Admitting: Endocrinology

## 2016-12-13 NOTE — Telephone Encounter (Signed)
Please refer call to PCP

## 2016-12-13 NOTE — Telephone Encounter (Signed)
Called but had to leave patient a VM to refer to PCP.

## 2016-12-13 NOTE — Telephone Encounter (Signed)
Patient has infected tooth. She is requesting an antibiotic be prescribed (allergic to sulpha's). Her pharmacy is [facility] on [address]. If questions please call patient.

## 2016-12-14 ENCOUNTER — Other Ambulatory Visit: Payer: Self-pay

## 2016-12-14 MED ORDER — OMEPRAZOLE 40 MG PO CPDR: 40.0000 mg | DELAYED_RELEASE_CAPSULE | Freq: Every day | ORAL | 2 refills | Status: DC

## 2016-12-14 MED ORDER — NAPROXEN 500 MG PO TABS: 500.0000 mg | ORAL_TABLET | Freq: Two times a day (BID) | ORAL | 4 refills | Status: DC

## 2016-12-15 ENCOUNTER — Other Ambulatory Visit: Payer: Self-pay | Admitting: Endocrinology

## 2016-12-15 NOTE — Telephone Encounter (Signed)
Please refer request to PCP

## 2016-12-21 ENCOUNTER — Other Ambulatory Visit: Payer: Self-pay | Admitting: Family Medicine

## 2016-12-21 NOTE — Telephone Encounter (Signed)
Pt is long-overdue for a f/u OV with FASTING labs. Please have her sched an appt within the next month so we can continue to make sure the medicines are keeping her healthy and safe before we refill them further. Thanks.

## 2017-02-02 ENCOUNTER — Other Ambulatory Visit: Payer: Self-pay | Admitting: Family Medicine

## 2017-02-22 ENCOUNTER — Other Ambulatory Visit: Payer: Self-pay | Admitting: Family Medicine

## 2017-03-16 ENCOUNTER — Other Ambulatory Visit: Payer: Self-pay | Admitting: Family Medicine

## 2017-03-23 ENCOUNTER — Other Ambulatory Visit: Payer: Self-pay | Admitting: Family Medicine

## 2017-04-01 ENCOUNTER — Ambulatory Visit: Payer: BLUE CROSS/BLUE SHIELD | Admitting: Endocrinology

## 2017-04-01 ENCOUNTER — Encounter: Payer: Self-pay | Admitting: Endocrinology

## 2017-04-01 VITALS — BP 155/97 | HR 91 | Wt 315.6 lb

## 2017-04-01 DIAGNOSIS — E103299 Type 1 diabetes mellitus with mild nonproliferative diabetic retinopathy without macular edema, unspecified eye: Secondary | ICD-10-CM

## 2017-04-01 LAB — POCT GLYCOSYLATED HEMOGLOBIN (HGB A1C): Hemoglobin A1C: 6.7

## 2017-04-01 MED ORDER — INSULIN GLARGINE 100 UNIT/ML SOLOSTAR PEN: 45.0000 [IU] | PEN_INJECTOR | SUBCUTANEOUS | 11 refills | Status: DC

## 2017-04-01 NOTE — Progress Notes (Signed)
Subjective:    Patient ID: Suzanne Duke, female    DOB: 1960/03/23, 57 y.o.   MRN: [id number]  HPI Pt returns for f/u of diabetes mellitus: DM type: insulin-requiring type 2 Dx'ed:1992, during a pregnancy, but it persisted after the pregnancy.  Complications: non-proliferative retinopathy.  Therapy: insulin since 2014. DKA: never Severe hypoglycemia: last episode was 2009.   Pancreatitis: never.  Other: she took insulin until she had bariatric surgery (gastric bypass) in 2008; she weighed 492 prior to the surgery (she declines repeat surgery); she was then controlled with orals until mid-2014, when she needed to resume the insulin; she declines multiple daily injections.   Interval history: no cbg record, but states cbg's are well-controlled.   Past Medical History:  Diagnosis Date  . ANEMIA-IRON DEFICIENCY 02/17/2007  . ANXIETY 02/17/2007  . Arthritis   . DEPRESSION 02/17/2007  . DM 07/17/2007  . Edema 07/21/2008  . GERD 05/11/2010  . GOITER, NONTOXIC MULTINODULAR 01/07/2007  . HYPERTENSION 09/21/2006  . LUMBAR RADICULOPATHY 01/13/2007  . Morbid obesity ([place])    lost >200 lbs following bariatric surgery  . UNSPECIFIED NEURALGIA NEURITIS AND RADICULITIS 01/07/2007    Past Surgical History:  Procedure Laterality Date  . CHOLECYSTECTOMY  1992  . ELECTROCARDIOGRAM  08/22/2005  . GASTRIC BYPASS OPEN  2008  . INCISION AND DRAINAGE PERIRECTAL ABSCESS  1989   s/p  . TREATMENT FISTULA ANAL  1993    Social History   Socioeconomic History  . Marital status: Married    Spouse name: Not on file  . Number of children: Not on file  . Years of education: Not on file  . Highest education level: Not on file  Social Needs  . Financial resource strain: Not on file  . Food insecurity - worry: Not on file  . Food insecurity - inability: Not on file  . Transportation needs - medical: Not on file  . Transportation needs - non-medical: Not on file  Occupational History  . Not on file    Tobacco Use  . Smoking status: Current Every Day Smoker    Packs/day: 0.50    Types: Cigarettes  . Smokeless tobacco: Never Used  Substance and Sexual Activity  . Alcohol use: No  . Drug use: No  . Sexual activity: Yes    Birth control/protection: Injection  Other Topics Concern  . Not on file  Social History Narrative  . Not on file    Current Outpatient Medications on File Prior to Visit  Medication Sig Dispense Refill  . BD PEN NEEDLE NANO U/F 32G X 4 MM MISC USE WITH INSULIN PENS AS DIRECTED 100 each 0  . calcium carbonate (OS-CAL) 600 MG TABS Take 600 mg by mouth 2 (two) times daily.    . Ferrous Sulfate (IRON) 325 (65 FE) MG TABS Take 1 tablet by mouth 2 (two) times daily.     Marland Kitchen glucose blood (ONETOUCH VERIO) test strip 1 each by Other route 2 (two) times daily. And lancets 2/day 250.03 100 each 12  . glucose blood test strip 1 each by Other route daily. And lancets 1/day 50 each 11  . losartan (COZAAR) 50 MG tablet TAKE 1 TABLET (50 MG TOTAL) BY MOUTH DAILY. **NEED OFFICE VISIT FOR ANY REFILLS** 15 tablet 0  . Multiple Vitamin (MULTIVITAMIN) tablet Take 1 tablet by mouth 2 (two) times daily.    . naproxen (NAPROSYN) 500 MG tablet Take 1 tablet (500 mg total) by mouth 2 (two) times daily with  a meal. 180 tablet 4  . omeprazole (PRILOSEC) 40 MG capsule Take 1 capsule (40 mg total) by mouth daily. 90 capsule 2  . sertraline (ZOLOFT) 50 MG tablet TAKE 3 TABLETS BY MOUTH EVERY DAY 270 tablet 1  . vitamin B-12 (CYANOCOBALAMIN) 1000 MCG tablet Take 1,000 mcg by mouth daily.     No current facility-administered medications on file prior to visit.     Allergies  Allergen Reactions  . Sulfonamide Derivatives     Family History  Problem Relation Age of Onset  . Cancer Mother        Breast Cancer  . Diabetes Mother   . Renal Disease Mother   . Heart disease Mother   . Hyperlipidemia Mother   . Cancer Sister 31       Colon Cancer  . Hypertension Father   . Cancer Father         Prostate  . Diabetes Father   . Arthritis Father   . Diabetes Sister   . Diabetes Brother   . Hypertension Brother     BP (!) 155/97 (BP Location: Left Arm, Patient Position: Sitting, Cuff Size: Normal)   Pulse 91   Wt (!) 315 lb 9.6 oz (143.2 kg)   SpO2 96%   BMI 52.52 kg/m    Review of Systems She denies hypoglycemia.     Objective:   Physical Exam VITAL SIGNS:  See vs page GENERAL: no distress Pulses: dorsalis pedis intact bilat.   MSK: no deformity of the feet CV: trace bilat leg edema Skin:  no ulcer on the feet.  normal color and temp on the feet. Neuro: sensation is intact to touch on the feet    Lab Results  Component Value Date   HGBA1C 6.7 04/01/2017      Assessment & Plan:  Insulin-requiring type 2 DM: overcontrolled, given this regimen, which does match insulin to her changing needs throughout the day.    Patient Instructions  Please reduce the insulin to 45 units each morning.   Please come back for a follow-up appointment in 4 months.   check your blood sugar twice a day.  vary the time of day when you check, between before the 3 meals, and at bedtime.  also check if you have symptoms of your blood sugar being too high or too low.  please keep a record of the readings and bring it to your next appointment here (or you can bring the meter itself).  You can write it on any piece of paper.  please call us sooner if your blood sugar goes below 70, or if you have a lot of readings over 200.

## 2017-04-01 NOTE — Patient Instructions (Signed)
Please reduce the insulin to 45 units each morning.   Please come back for a follow-up appointment in 4 months.   check your blood sugar twice a day.  vary the time of day when you check, between before the 3 meals, and at bedtime.  also check if you have symptoms of your blood sugar being too high or too low.  please keep a record of the readings and bring it to your next appointment here (or you can bring the meter itself).  You can write it on any piece of paper.  please call us sooner if your blood sugar goes below 70, or if you have a lot of readings over 200.

## 2017-04-13 ENCOUNTER — Ambulatory Visit (INDEPENDENT_AMBULATORY_CARE_PROVIDER_SITE_OTHER): Payer: BLUE CROSS/BLUE SHIELD

## 2017-04-13 ENCOUNTER — Ambulatory Visit: Payer: BLUE CROSS/BLUE SHIELD | Admitting: Family Medicine

## 2017-04-13 ENCOUNTER — Other Ambulatory Visit: Payer: Self-pay

## 2017-04-13 ENCOUNTER — Encounter: Payer: Self-pay | Admitting: Family Medicine

## 2017-04-13 VITALS — BP 127/82 | HR 60 | Temp 97.8°F | Resp 16 | Ht 67.5 in | Wt 310.0 lb

## 2017-04-13 DIAGNOSIS — J4521 Mild intermittent asthma with (acute) exacerbation: Secondary | ICD-10-CM

## 2017-04-13 DIAGNOSIS — Z794 Long term (current) use of insulin: Secondary | ICD-10-CM | POA: Diagnosis not present

## 2017-04-13 DIAGNOSIS — E041 Nontoxic single thyroid nodule: Secondary | ICD-10-CM | POA: Diagnosis not present

## 2017-04-13 DIAGNOSIS — J309 Allergic rhinitis, unspecified: Secondary | ICD-10-CM

## 2017-04-13 DIAGNOSIS — K7581 Nonalcoholic steatohepatitis (NASH): Secondary | ICD-10-CM

## 2017-04-13 DIAGNOSIS — R05 Cough: Secondary | ICD-10-CM

## 2017-04-13 DIAGNOSIS — E113299 Type 2 diabetes mellitus with mild nonproliferative diabetic retinopathy without macular edema, unspecified eye: Secondary | ICD-10-CM | POA: Diagnosis not present

## 2017-04-13 DIAGNOSIS — Z5181 Encounter for therapeutic drug level monitoring: Secondary | ICD-10-CM

## 2017-04-13 DIAGNOSIS — R0982 Postnasal drip: Secondary | ICD-10-CM

## 2017-04-13 DIAGNOSIS — I10 Essential (primary) hypertension: Secondary | ICD-10-CM

## 2017-04-13 DIAGNOSIS — R059 Cough, unspecified: Secondary | ICD-10-CM

## 2017-04-13 DIAGNOSIS — E042 Nontoxic multinodular goiter: Secondary | ICD-10-CM

## 2017-04-13 LAB — POCT URINALYSIS DIP (MANUAL ENTRY)
BILIRUBIN UA: NEGATIVE mg/dL
Bilirubin, UA: NEGATIVE
Blood, UA: NEGATIVE
Glucose, UA: NEGATIVE mg/dL
LEUKOCYTES UA: NEGATIVE
Nitrite, UA: NEGATIVE
PH UA: 5 (ref 5.0–8.0)
PROTEIN UA: NEGATIVE mg/dL
SPEC GRAV UA: 1.025 (ref 1.010–1.025)
Urobilinogen, UA: 0.2 E.U./dL

## 2017-04-13 LAB — POCT CBC
GRANULOCYTE PERCENT: 45.9 % (ref 37–80)
HCT, POC: 37 % — AB (ref 37.7–47.9)
HEMOGLOBIN: 11.9 g/dL — AB (ref 12.2–16.2)
Lymph, poc: 2.2 (ref 0.6–3.4)
MCH: 28.2 pg (ref 27–31.2)
MCHC: 32.3 g/dL (ref 31.8–35.4)
MCV: 87.4 fL (ref 80–97)
MID (cbc): 0.2 (ref 0–0.9)
MPV: 7.5 fL (ref 0–99.8)
POC Granulocyte: 2 (ref 2–6.9)
POC LYMPH PERCENT: 50.3 %L — AB (ref 10–50)
POC MID %: 3.8 %M (ref 0–12)
Platelet Count, POC: 299 10*3/uL (ref 142–424)
RBC: 4.23 M/uL (ref 4.04–5.48)
RDW, POC: 13.2 %
WBC: 4.4 10*3/uL — AB (ref 4.6–10.2)

## 2017-04-13 MED ORDER — MOMETASONE FURO-FORMOTEROL FUM 200-5 MCG/ACT IN AERO: 2.0000 | INHALATION_SPRAY | Freq: Two times a day (BID) | RESPIRATORY_TRACT | 0 refills | Status: DC

## 2017-04-13 MED ORDER — FLUTICASONE PROPIONATE 50 MCG/ACT NA SUSP: 2.0000 | Freq: Every day | NASAL | 2 refills | Status: DC

## 2017-04-13 MED ORDER — ALBUTEROL SULFATE (2.5 MG/3ML) 0.083% IN NEBU
2.5000 mg | INHALATION_SOLUTION | Freq: Once | RESPIRATORY_TRACT | Status: AC
  Administered 2017-04-13: 2.5 mg via RESPIRATORY_TRACT

## 2017-04-13 MED ORDER — BENZONATATE 200 MG PO CAPS: 200.0000 mg | ORAL_CAPSULE | Freq: Three times a day (TID) | ORAL | 0 refills | Status: DC | PRN

## 2017-04-13 MED ORDER — LOSARTAN POTASSIUM 50 MG PO TABS: 50.0000 mg | ORAL_TABLET | Freq: Every day | ORAL | 1 refills | Status: DC

## 2017-04-13 MED ORDER — CETIRIZINE HCL 10 MG PO TABS: 10.0000 mg | ORAL_TABLET | Freq: Every day | ORAL | 11 refills | Status: DC

## 2017-04-13 MED ORDER — ALBUTEROL SULFATE HFA 108 (90 BASE) MCG/ACT IN AERS: 2.0000 | INHALATION_SPRAY | RESPIRATORY_TRACT | 1 refills | Status: DC | PRN

## 2017-04-13 NOTE — Patient Instructions (Addendum)
Hot showers or breathing in steam may help loosen the congestion.  Using a netti pot or sinus rinse is also likely to help you feel better and keep this from progressing.  Use the cetrizine tab and the fluticasone nasal spray every night before bed for at least 2 weeks.  I recommend generic mucinex to help you move out the congestion.  If no improvement or you are getting worse, come back as you might need a course of steroids which would be difficult to use due to your diabetes but hopefully with all of the above, you can avoid it.  Most likely this is Classic Upper airway cough syndrome, so named because it's frequently impossible to sort out how much is chronic rhinitis/sinusitis and post-nasal drip.  Try at night allergy medications since this is when cough is the worst. Suppress your cough and throat clearing with candy and delsym and tessalon pearles, not throat lozenges.   IF you received an x-ray today, you will receive an invoice from [facility]. Please contact [facility] at [phone] with questions or concerns regarding your invoice.   IF you received labwork today, you will receive an invoice from [facility]. Please contact [facility] at [phone] with questions or concerns regarding your invoice.   Our billing staff will not be able to assist you with questions regarding bills from these companies.  You will be contacted with the lab results as soon as they are available. The fastest way to get your results is to activate your My Chart account. Instructions are located on the last page of this paperwork. If you have not heard from [place] regarding the results in 2 weeks, please contact this office.     Bronchospasm, Adult Bronchospasm is a tightening of the airways going into the lungs. During an episode, it may be harder to breathe. You may cough, and you may make a whistling sound when you breathe (wheeze). This condition often affects people with asthma. What are  the causes? This condition is caused by swelling and irritation in the airways. It can be triggered by:  An infection (common).  Seasonal allergies.  An allergic reaction.  Exercise.  Irritants. These include pollution, cigarette smoke, strong odors, aerosol sprays, and paint fumes.  Weather changes. Winds increase molds and pollens in the air. Cold air may cause swelling.  Stress and emotional upset.  What are the signs or symptoms? Symptoms of this condition include:  Wheezing. If the episode was triggered by an allergy, wheezing may start right away or hours later.  Nighttime coughing.  Frequent or severe coughing with a simple cold.  Chest tightness.  Shortness of breath.  Decreased ability to exercise.  How is this diagnosed? This condition is usually diagnosed with a review of your medical history and a physical exam. Tests, such as lung function tests, are sometimes done to look for other conditions. The need for a chest X-ray depends on where the wheezing occurs and whether it is the first time you have wheezed. How is this treated? This condition may be treated with:  Inhaled medicines. These open up the airways and help you breathe. They can be taken with an inhaler or a nebulizer device.  Corticosteroid medicines. These may be given for severe bronchospasm, usually when it is associated with asthma.  Avoiding triggers, such as irritants, infection, or allergies.  Follow these instructions at home: Medicines  Take over-the-counter and prescription medicines only as told by your health care provider.  If you  need to use an inhaler or nebulizer to take your medicine, ask your health care provider to explain how to use it correctly. If you were given a spacer, always use it with your inhaler. Lifestyle  Reduce the number of triggers in your home. To do this: ? Change your heating and air conditioning filter at least once a month. ? Limit your use of  fireplaces and wood stoves. ? Do not smoke. Do not allow smoking in your home. ? Avoid using perfumes and fragrances. ? Get rid of pests, such as roaches and mice, and their droppings. ? Remove any mold from your home. ? Keep your house clean and dust free. Use unscented cleaning products. ? Replace carpet with wood, tile, or vinyl flooring. Carpet can trap dander and dust. ? Use allergy-proof pillows, mattress covers, and box spring covers. ? Wash bed sheets and blankets every week in hot water. Dry them in a dryer. ? Use blankets that are made of polyester or cotton. ? Wash your hands often. ? Do not allow pets in your bedroom.  Avoid breathing in cold air when you exercise. General instructions  Have a plan for seeking medical care. Know when to call your health care provider and local emergency services, and where to get emergency care.  Stay up to date on your immunizations.  When you have an episode of bronchospasm, stay calm. Try to relax and breathe more slowly.  If you have asthma, make sure you have an asthma action plan.  Keep all follow-up visits as told by your health care provider. This is important. Contact a health care provider if:  You have muscle aches.  You have chest pain.  The mucus that you cough up (sputum) changes from clear or white to yellow, green, gray, or bloody.  You have a fever.  Your sputum gets thicker. Get help right away if:  Your wheezing and coughing get worse, even after you take your prescribed medicines.  It gets even harder to breathe.  You develop severe chest pain. Summary  Bronchospasm is a tightening of the airways going into the lungs.  During an episode of bronchospasm, you may have a harder time breathing. You may cough and make a whistling sound when you breathe (wheeze).  Avoid exposure to triggers such as smoke, dust, mold, animal dander, and fragrances.  When you have an episode of bronchospasm, stay calm. Try to  relax and breathe more slowly. This information is not intended to replace advice given to you by your health care provider. Make sure you discuss any questions you have with your health care provider. Document Released: 02/15/2003 Document Revised: 02/09/2016 Document Reviewed: 02/09/2016 Elsevier Interactive Patient Education  2017 [employer].

## 2017-04-13 NOTE — Progress Notes (Signed)
By signing my name below, I, Mayer Masker, attest that this documentation has been prepared under the direction and in the presence of Brigitte Pulse Laurey Arrow, MD. Electronically Signed: Mayer Masker, Medical Scribe 04/13/2017 at 8:10 AM.  Subjective:    Patient ID: Suzanne Duke, female    DOB: [birth year], 57 y.o.   MRN: [id number]  HPI Chief Complaint  Patient presents with  . Cough    patient presents with cough x 4 weeks, productive with clear mucus. Patient denies fever, nausea or vomiting, states that she has had slight diarrhea  . Medication Refill    patient requesting refill for Cozaar   Suzanne Duke is a 57 y.o. female who presents to Primary Care at [facility] complaining of persistent productive (clear) cough for 4 weeks. It started off as "head cold"-like symptoms, including HA, hoarse voice, post-nasal drip, and congestion. She states her main concern now is her cough. She has been taking OTC medications and drinking green tea with lemon and honey with mild to no relief. She denies fever. She checks her BP at home and this is well-controlled. Her other PCP was with the health department. She has NKDA other than sulfur. She takes B12 daily. Pt has PSHx of gastric bypass.   Past Medical History:  Diagnosis Date  . ANEMIA-IRON DEFICIENCY 02/17/2007  . ANXIETY 02/17/2007  . Arthritis   . DEPRESSION 02/17/2007  . DM 07/17/2007  . Edema 07/21/2008  . GERD 05/11/2010  . GOITER, NONTOXIC MULTINODULAR 01/07/2007  . HYPERTENSION 09/21/2006  . LUMBAR RADICULOPATHY 01/13/2007  . Morbid obesity ([place])    lost >200 lbs following bariatric surgery  . UNSPECIFIED NEURALGIA NEURITIS AND RADICULITIS 01/07/2007   Past Surgical History:  Procedure Laterality Date  . CHOLECYSTECTOMY  1992  . ELECTROCARDIOGRAM  08/22/2005  . GASTRIC BYPASS OPEN  2008  . INCISION AND DRAINAGE PERIRECTAL ABSCESS  1989   s/p  . TREATMENT FISTULA ANAL  1993   Current Outpatient Medications on File Prior to Visit    Medication Sig Dispense Refill  . BD PEN NEEDLE NANO U/F 32G X 4 MM MISC USE WITH INSULIN PENS AS DIRECTED 100 each 0  . calcium carbonate (OS-CAL) 600 MG TABS Take 600 mg by mouth 2 (two) times daily.    . Ferrous Sulfate (IRON) 325 (65 FE) MG TABS Take 1 tablet by mouth 2 (two) times daily.     Marland Kitchen glucose blood (ONETOUCH VERIO) test strip 1 each by Other route 2 (two) times daily. And lancets 2/day 250.03 100 each 12  . glucose blood test strip 1 each by Other route daily. And lancets 1/day 50 each 11  . Insulin Glargine (LANTUS) 100 UNIT/ML Solostar Pen Inject 45 Units into the skin every morning. And pen needles 1/day 15 mL 11  . losartan (COZAAR) 50 MG tablet TAKE 1 TABLET (50 MG TOTAL) BY MOUTH DAILY. **NEED OFFICE VISIT FOR ANY REFILLS** 15 tablet 0  . Multiple Vitamin (MULTIVITAMIN) tablet Take 1 tablet by mouth 2 (two) times daily.    . naproxen (NAPROSYN) 500 MG tablet Take 1 tablet (500 mg total) by mouth 2 (two) times daily with a meal. 180 tablet 4  . omeprazole (PRILOSEC) 40 MG capsule Take 1 capsule (40 mg total) by mouth daily. 90 capsule 2  . sertraline (ZOLOFT) 50 MG tablet TAKE 3 TABLETS BY MOUTH EVERY DAY 270 tablet 1  . vitamin B-12 (CYANOCOBALAMIN) 1000 MCG tablet Take 1,000 mcg by mouth daily.  No current facility-administered medications on file prior to visit.    Allergies  Allergen Reactions  . Sulfonamide Derivatives    Past Medical History:  Diagnosis Date  . ANEMIA-IRON DEFICIENCY 02/17/2007  . ANXIETY 02/17/2007  . Arthritis   . DEPRESSION 02/17/2007  . DM 07/17/2007  . Edema 07/21/2008  . GERD 05/11/2010  . GOITER, NONTOXIC MULTINODULAR 01/07/2007  . HYPERTENSION 09/21/2006  . LUMBAR RADICULOPATHY 01/13/2007  . Morbid obesity ([place])    lost >200 lbs following bariatric surgery  . UNSPECIFIED NEURALGIA NEURITIS AND RADICULITIS 01/07/2007   Past Surgical History:  Procedure Laterality Date  . CHOLECYSTECTOMY  1992  . ELECTROCARDIOGRAM  08/22/2005  .  GASTRIC BYPASS OPEN  2008  . INCISION AND DRAINAGE PERIRECTAL ABSCESS  1989   s/p  . TREATMENT FISTULA ANAL  1993   Current Outpatient Medications on File Prior to Visit  Medication Sig Dispense Refill  . BD PEN NEEDLE NANO U/F 32G X 4 MM MISC USE WITH INSULIN PENS AS DIRECTED 100 each 0  . calcium carbonate (OS-CAL) 600 MG TABS Take 600 mg by mouth 2 (two) times daily.    . Ferrous Sulfate (IRON) 325 (65 FE) MG TABS Take 1 tablet by mouth 2 (two) times daily.     Marland Kitchen glucose blood (ONETOUCH VERIO) test strip 1 each by Other route 2 (two) times daily. And lancets 2/day 250.03 100 each 12  . glucose blood test strip 1 each by Other route daily. And lancets 1/day 50 each 11  . Insulin Glargine (LANTUS) 100 UNIT/ML Solostar Pen Inject 45 Units into the skin every morning. And pen needles 1/day 15 mL 11  . losartan (COZAAR) 50 MG tablet TAKE 1 TABLET (50 MG TOTAL) BY MOUTH DAILY. **NEED OFFICE VISIT FOR ANY REFILLS** 15 tablet 0  . Multiple Vitamin (MULTIVITAMIN) tablet Take 1 tablet by mouth 2 (two) times daily.    . naproxen (NAPROSYN) 500 MG tablet Take 1 tablet (500 mg total) by mouth 2 (two) times daily with a meal. 180 tablet 4  . omeprazole (PRILOSEC) 40 MG capsule Take 1 capsule (40 mg total) by mouth daily. 90 capsule 2  . sertraline (ZOLOFT) 50 MG tablet TAKE 3 TABLETS BY MOUTH EVERY DAY 270 tablet 1  . vitamin B-12 (CYANOCOBALAMIN) 1000 MCG tablet Take 1,000 mcg by mouth daily.     No current facility-administered medications on file prior to visit.    Allergies  Allergen Reactions  . Sulfonamide Derivatives    Family History  Problem Relation Age of Onset  . Cancer Mother        Breast Cancer  . Diabetes Mother   . Renal Disease Mother   . Heart disease Mother   . Hyperlipidemia Mother   . Cancer Sister 63       Colon Cancer  . Hypertension Father   . Cancer Father        Prostate  . Diabetes Father   . Arthritis Father   . Diabetes Sister   . Diabetes Brother   .  Hypertension Brother    Social History   Socioeconomic History  . Marital status: Married    Spouse name: None  . Number of children: None  . Years of education: None  . Highest education level: None  Social Needs  . Financial resource strain: None  . Food insecurity - worry: None  . Food insecurity - inability: None  . Transportation needs - medical: None  . Transportation needs - non-medical:  None  Occupational History  . None  Tobacco Use  . Smoking status: Current Every Day Smoker    Packs/day: 0.50    Types: Cigarettes  . Smokeless tobacco: Never Used  Substance and Sexual Activity  . Alcohol use: No  . Drug use: No  . Sexual activity: Yes    Birth control/protection: Injection  Other Topics Concern  . None  Social History Narrative  . None   Depression screen [facility] 2/9 04/13/2017 11/25/2015 11/21/2015  Decreased Interest 0 0 0  Down, Depressed, Hopeless 0 0 0  PHQ - 2 Score 0 0 0    Review of Systems  Constitutional: Negative for fever.  HENT: Positive for congestion, postnasal drip and voice change.   Neurological: Positive for headaches.       Objective:   Physical Exam  Constitutional: She is oriented to person, place, and time. She appears well-developed and well-nourished. No distress.  HENT:  Head: Normocephalic and atraumatic.  Right Ear: External ear normal.  Left Ear: External ear normal.  TM's normal, canals clear Nares- pale and boggy Petechiae soft palate clear post nasal drip.   Eyes: Conjunctivae are normal. No scleral icterus.  Neck: Normal range of motion. Neck supple. Thyromegaly present.   enlarged goitrous thyroid. No lymphadenopathy.  Cardiovascular: Normal rate, regular rhythm, normal heart sounds and intact distal pulses.  Pulmonary/Chest: Effort normal and breath sounds normal. No respiratory distress.  Good air movement, expiratory rhonchi throughout, worse in L upper lobe Lung exam: anterior L upper lobe inspiratory rales,  expiratory wheeze,  Musculoskeletal: She exhibits no edema.  Lymphadenopathy:    She has no cervical adenopathy.  Neurological: She is alert and oriented to person, place, and time.  Skin: Skin is warm and dry. She is not diaphoretic. No erythema.  Psychiatric: She has a normal mood and affect. Her behavior is normal.   BP 127/82 (BP Location: Right Arm, Patient Position: Sitting, Cuff Size: Large)   Pulse 60   Temp 97.8 F (36.6 C) (Oral)   Resp 16   Ht 5' 7.5" (1.715 m)   Wt (!) 310 lb (140.6 kg)   SpO2 97%   BMI 47.84 kg/m   Dg Chest 2 View  Result Date: 04/13/2017 CLINICAL DATA:  Productive cough for 1 month.  Expiratory rhonchi. EXAM: CHEST  2 VIEW COMPARISON:  03/31/2015 FINDINGS: Both lungs are clear. Heart and mediastinum are within normal limits. Trachea is midline. No large pleural effusions. Surgical clips in the upper abdomen. Bony structures are within normal limits. IMPRESSION: No active cardiopulmonary disease. Electronically Signed   By: Markus Daft M.D.   On: 04/13/2017 08:58    Results for orders placed or performed in visit on 04/13/17  POCT CBC  Result Value Ref Range   WBC 4.4 (A) 4.6 - 10.2 K/uL   Lymph, poc 2.2 0.6 - 3.4   POC LYMPH PERCENT 50.3 (A) 10 - 50 %L   MID (cbc) 0.2 0 - 0.9   POC MID % 3.8 0 - 12 %M   POC Granulocyte 2.0 2 - 6.9   Granulocyte percent 45.9 37 - 80 %G   RBC 4.23 4.04 - 5.48 M/uL   Hemoglobin 11.9 (A) 12.2 - 16.2 g/dL   HCT, POC 37.0 (A) 37.7 - 47.9 %   MCV 87.4 80 - 97 fL   MCH, POC 28.2 27 - 31.2 pg   MCHC 32.3 31.8 - 35.4 g/dL   RDW, POC 13.2 %   Platelet Count,  POC 299 142 - 424 K/uL   MPV 7.5 0 - 99.8 fL  POCT urinalysis dipstick  Result Value Ref Range   Color, UA yellow yellow   Clarity, UA clear clear   Glucose, UA negative negative mg/dL   Bilirubin, UA negative negative   Ketones, POC UA negative negative mg/dL   Spec Grav, UA 1.025 1.010 - 1.025   Blood, UA negative negative   pH, UA 5.0 5.0 - 8.0   Protein  Ur, POC negative negative mg/dL   Urobilinogen, UA 0.2 0.2 or 1.0 E.U./dL   Nitrite, UA Negative Negative   Leukocytes, UA Negative Negative       Assessment & Plan:   1. Type 2 diabetes mellitus with mild nonproliferative retinopathy without macular edema, unspecified laterality (HCC) - followed by Dr. Zada Girt endocrinology  2. NASH (nonalcoholic steatohepatitis)   3. Essential hypertension - refilled losartan 50  4. Medication monitoring encounter   5. Cough - try prn tessalon - suspect secondary to PND  6. GOITER, NONTOXIC MULTINODULAR - had [place] 10/2014 that noted f/u clinically as dominant nodules had been biopsies previously - if risk for thyroid carcinoma, repeat US. TSH 10/2015 nml so do not need nuclear medicine scan.  Repeat US?  7. Thyroid nodule   8. Allergic rhinitis with postnasal drip - start zyrtec and flonase  9. Mild intermittent reactive airway disease with acute exacerbation - want to avoid systemic steroids due to IDDM but still wheezing after alb neb in office so will start pt on Dulera x 2 weeks just while she is ill - coupon for free inhaler given    Orders Placed This Encounter  Procedures  . US THYROID    Standing Status:   Future    Standing Expiration Date:   06/12/2018    Order Specific Question:   Reason for Exam (SYMPTOM  OR DIAGNOSIS REQUIRED)    Answer:   enlarged thyroid, f/u abnormal nodules on throid [place] 2016    Order Specific Question:   Preferred imaging location?    Answer:   [facility]  . DG Chest 2 View    Standing Status:   Future    Number of Occurrences:   1    Standing Expiration Date:   04/13/2018    Order Specific Question:   Reason for Exam (SYMPTOM  OR DIAGNOSIS REQUIRED)    Answer:   productive cough x 1 mo, expiratory rhonchi, LUL rales and wheeze, pt worse as hospice caregiver    Order Specific Question:   Is the patient pregnant?    Answer:   No    Order Specific Question:   Preferred imaging location?     Answer:   External  . Comprehensive metabolic panel    Order Specific Question:   Has the patient fasted?    Answer:   Yes  . TSH  . Lipid panel    Order Specific Question:   Has the patient fasted?    Answer:   Yes  . Microalbumin/Creatinine Ratio, Urine  . POCT CBC  . POCT urinalysis dipstick    Meds ordered this encounter  Medications  . albuterol (PROVENTIL) (2.5 MG/3ML) 0.083% nebulizer solution 2.5 mg  . albuterol (PROVENTIL HFA;VENTOLIN HFA) 108 (90 Base) MCG/ACT inhaler    Sig: Inhale 2 puffs into the lungs every 4 (four) hours as needed for wheezing or shortness of breath (cough, shortness of breath or wheezing.).    Dispense:  1 Inhaler  Refill:  1  . fluticasone (FLONASE) 50 MCG/ACT nasal spray    Sig: Place 2 sprays into both nostrils at bedtime.    Dispense:  16 g    Refill:  2  . cetirizine (ZYRTEC) 10 MG tablet    Sig: Take 1 tablet (10 mg total) by mouth at bedtime.    Dispense:  30 tablet    Refill:  11  . benzonatate (TESSALON) 200 MG capsule    Sig: Take 1 capsule (200 mg total) by mouth 3 (three) times daily as needed for cough.    Dispense:  30 capsule    Refill:  0  . mometasone-formoterol (DULERA) 200-5 MCG/ACT AERO    Sig: Inhale 2 puffs into the lungs 2 (two) times daily.    Dispense:  13 g    Refill:  0  . losartan (COZAAR) 50 MG tablet    Sig: Take 1 tablet (50 mg total) by mouth daily.    Dispense:  90 tablet    Refill:  1    I personally performed the services described in this documentation, which was scribed in my presence. The recorded information has been reviewed and considered, and addended by me as needed.   Delman Cheadle, M.D.  Primary Care at [facility] [address] [place], [place] [address] [phone] phone [phone] fax  04/15/17 1:25 AM

## 2017-04-16 LAB — COMPREHENSIVE METABOLIC PANEL
A/G RATIO: 1.3 (ref 1.2–2.2)
ALT: 29 IU/L (ref 0–32)
AST: 29 IU/L (ref 0–40)
Albumin: 3.8 g/dL (ref 3.5–5.5)
Alkaline Phosphatase: 108 IU/L (ref 39–117)
BILIRUBIN TOTAL: 0.2 mg/dL (ref 0.0–1.2)
BUN / CREAT RATIO: 13 (ref 9–23)
BUN: 9 mg/dL (ref 6–24)
CALCIUM: 9.3 mg/dL (ref 8.7–10.2)
CHLORIDE: 109 mmol/L — AB (ref 96–106)
CO2: 25 mmol/L (ref 20–29)
Creatinine, Ser: 0.69 mg/dL (ref 0.57–1.00)
GFR, EST AFRICAN AMERICAN: 114 mL/min/{1.73_m2} (ref >59)
GFR, EST NON AFRICAN AMERICAN: 99 mL/min/{1.73_m2} (ref >59)
Globulin, Total: 2.9 g/dL (ref 1.5–4.5)
Glucose: 117 mg/dL — ABNORMAL HIGH (ref 65–99)
POTASSIUM: 4.5 mmol/L (ref 3.5–5.2)
Sodium: 146 mmol/L — ABNORMAL HIGH (ref 134–144)
TOTAL PROTEIN: 6.7 g/dL (ref 6.0–8.5)

## 2017-04-16 LAB — MICROALBUMIN / CREATININE URINE RATIO

## 2017-04-16 LAB — TSH: TSH: 0.76 u[IU]/mL (ref 0.450–4.500)

## 2017-04-16 LAB — LIPID PANEL
Chol/HDL Ratio: 2.8 ratio (ref 0.0–4.4)
Cholesterol, Total: 144 mg/dL (ref 100–199)
HDL: 51 mg/dL (ref >39)
LDL CALC: 80 mg/dL (ref 0–99)
TRIGLYCERIDES: 66 mg/dL (ref 0–149)
VLDL Cholesterol Cal: 13 mg/dL (ref 5–40)

## 2017-05-08 ENCOUNTER — Other Ambulatory Visit: Payer: Self-pay | Admitting: Endocrinology

## 2017-05-10 ENCOUNTER — Other Ambulatory Visit: Payer: Self-pay | Admitting: Family Medicine

## 2017-07-04 ENCOUNTER — Other Ambulatory Visit: Payer: Self-pay | Admitting: Family Medicine

## 2017-07-30 ENCOUNTER — Encounter: Payer: Self-pay | Admitting: Endocrinology

## 2017-07-30 ENCOUNTER — Ambulatory Visit: Payer: BLUE CROSS/BLUE SHIELD | Admitting: Endocrinology

## 2017-07-30 VITALS — BP 138/80 | HR 91 | Wt 296.4 lb

## 2017-07-30 DIAGNOSIS — E103299 Type 1 diabetes mellitus with mild nonproliferative diabetic retinopathy without macular edema, unspecified eye: Secondary | ICD-10-CM

## 2017-07-30 LAB — POCT GLYCOSYLATED HEMOGLOBIN (HGB A1C): Hemoglobin A1C: 6.6 % — AB (ref 4.0–5.6)

## 2017-07-30 MED ORDER — INSULIN GLARGINE 100 UNIT/ML SOLOSTAR PEN
40.0000 [IU] | PEN_INJECTOR | SUBCUTANEOUS | 11 refills | Status: DC
Start: 2017-07-30 — End: 2017-10-31

## 2017-07-30 NOTE — Patient Instructions (Addendum)
Please reduce the insulin to 40 units each morning.   Please come back for a follow-up appointment in 4 months.   Please continue your weight loss efforts.   check your blood sugar twice a day.  vary the time of day when you check, between before the 3 meals, and at bedtime.  also check if you have symptoms of your blood sugar being too high or too low.  please keep a record of the readings and bring it to your next appointment here (or you can bring the meter itself).  You can write it on any piece of paper.  please call us sooner if your blood sugar goes below 70, or if you have a lot of readings over 200.

## 2017-07-30 NOTE — Progress Notes (Signed)
Subjective:    Patient ID: Suzanne Duke, female    DOB: 01/15/1961, 56 y.o.   MRN: [id number]  HPI Pt returns for f/u of diabetes mellitus: DM type: insulin-requiring type 2 Dx'ed:1992, during a pregnancy, but it persisted after delivery.  Complications: NPDR.  Therapy: insulin since 2014. DKA: never Severe hypoglycemia: last episode was 2009.   Pancreatitis: never.  Other: she took insulin until she had gastric bypass in 2008; she weighed 492 prior to the surgery (she declines repeat surgery); she was then controlled with orals until mid-2014, when she needed to resume the insulin; she declines multiple daily injections.   Interval history: no cbg record, but states cbg's are well-controlled.  pt states she feels well in general. Past Medical History:  Diagnosis Date  . ANEMIA-IRON DEFICIENCY 02/17/2007  . ANXIETY 02/17/2007  . Arthritis   . DEPRESSION 02/17/2007  . DM 07/17/2007  . Edema 07/21/2008  . GERD 05/11/2010  . GOITER, NONTOXIC MULTINODULAR 01/07/2007  . HYPERTENSION 09/21/2006  . LUMBAR RADICULOPATHY 01/13/2007  . Morbid obesity ([place])    lost >200 lbs following bariatric surgery  . UNSPECIFIED NEURALGIA NEURITIS AND RADICULITIS 01/07/2007    Past Surgical History:  Procedure Laterality Date  . CHOLECYSTECTOMY  1992  . ELECTROCARDIOGRAM  08/22/2005  . GASTRIC BYPASS OPEN  2008  . INCISION AND DRAINAGE PERIRECTAL ABSCESS  1989   s/p  . TREATMENT FISTULA ANAL  1993    Social History   Socioeconomic History  . Marital status: Married    Spouse name: Not on file  . Number of children: Not on file  . Years of education: Not on file  . Highest education level: Not on file  Occupational History  . Not on file  Social Needs  . Financial resource strain: Not on file  . Food insecurity:    Worry: Not on file    Inability: Not on file  . Transportation needs:    Medical: Not on file    Non-medical: Not on file  Tobacco Use  . Smoking status: Current Every  Day Smoker    Packs/day: 0.50    Types: Cigarettes  . Smokeless tobacco: Never Used  Substance and Sexual Activity  . Alcohol use: No  . Drug use: No  . Sexual activity: Yes    Birth control/protection: Injection  Lifestyle  . Physical activity:    Days per week: Not on file    Minutes per session: Not on file  . Stress: Not on file  Relationships  . Social connections:    Talks on phone: Not on file    Gets together: Not on file    Attends religious service: Not on file    Active member of club or organization: Not on file    Attends meetings of clubs or organizations: Not on file    Relationship status: Not on file  . Intimate partner violence:    Fear of current or ex partner: Not on file    Emotionally abused: Not on file    Physically abused: Not on file    Forced sexual activity: Not on file  Other Topics Concern  . Not on file  Social History Narrative  . Not on file    Current Outpatient Medications on File Prior to Visit  Medication Sig Dispense Refill  . albuterol (PROVENTIL HFA;VENTOLIN HFA) 108 (90 Base) MCG/ACT inhaler Inhale 2 puffs into the lungs every 4 (four) hours as needed for wheezing or shortness of breath (  cough, shortness of breath or wheezing.). 1 Inhaler 1  . BD PEN NEEDLE NANO U/F 32G X 4 MM MISC USE WITH INSULIN PENS AS DIRECTED 100 each 0  . benzonatate (TESSALON) 200 MG capsule Take 1 capsule (200 mg total) by mouth 3 (three) times daily as needed for cough. 30 capsule 0  . calcium carbonate (OS-CAL) 600 MG TABS Take 600 mg by mouth 2 (two) times daily.    . cetirizine (ZYRTEC) 10 MG tablet Take 1 tablet (10 mg total) by mouth at bedtime. 30 tablet 11  . DULERA 200-5 MCG/ACT AERO TAKE 2 PUFFS BY MOUTH TWICE A DAY 13 Inhaler 0  . Ferrous Sulfate (IRON) 325 (65 FE) MG TABS Take 1 tablet by mouth 2 (two) times daily.     . fluticasone (FLONASE) 50 MCG/ACT nasal spray PLACE 2 SPRAYS INTO BOTH NOSTRILS AT BEDTIME. 16 g 1  . glucose blood (ONETOUCH  VERIO) test strip 1 each by Other route 2 (two) times daily. And lancets 2/day 250.03 100 each 12  . glucose blood test strip 1 each by Other route daily. And lancets 1/day 50 each 11  . losartan (COZAAR) 50 MG tablet Take 1 tablet (50 mg total) by mouth daily. 90 tablet 1  . Multiple Vitamin (MULTIVITAMIN) tablet Take 1 tablet by mouth 2 (two) times daily.    . naproxen (NAPROSYN) 500 MG tablet Take 1 tablet (500 mg total) by mouth 2 (two) times daily with a meal. 180 tablet 4  . omeprazole (PRILOSEC) 40 MG capsule Take 1 capsule (40 mg total) by mouth daily. 90 capsule 2  . sertraline (ZOLOFT) 50 MG tablet TAKE 3 TABLETS BY MOUTH EVERY DAY 270 tablet 1  . vitamin B-12 (CYANOCOBALAMIN) 1000 MCG tablet Take 1,000 mcg by mouth daily.     No current facility-administered medications on file prior to visit.     Allergies  Allergen Reactions  . Sulfonamide Derivatives     Family History  Problem Relation Age of Onset  . Cancer Mother        Breast Cancer  . Diabetes Mother   . Renal Disease Mother   . Heart disease Mother   . Hyperlipidemia Mother   . Cancer Sister 27       Colon Cancer  . Hypertension Father   . Cancer Father        Prostate  . Diabetes Father   . Arthritis Father   . Diabetes Sister   . Diabetes Brother   . Hypertension Brother     BP 138/80   Pulse 91   Wt 296 lb 6.4 oz (134.4 kg)   SpO2 96%   BMI 45.74 kg/m    Review of Systems She has lost more weight, due to her efforts.  She denies hypoglycemia.      Objective:   Physical Exam VITAL SIGNS:  See vs page GENERAL: no distress Pulses: foot pulses are intact bilaterally.   MSK: no deformity of the feet or ankles.  CV: trace bilat edema of the legs. Skin:  no ulcer on the feet or ankles.  normal color and temp on the feet and ankles Neuro: sensation is intact to touch on the feet and ankles.    Lab Results  Component Value Date   HGBA1C 6.6 (A) 07/30/2017       Assessment & Plan:    Insulin-requiring type 2 DM: overcontrolled, given this regimen, which does match insulin to her changing needs throughout the day  Patient Instructions  Please reduce the insulin to 40 units each morning.   Please come back for a follow-up appointment in 4 months.   Please continue your weight loss efforts.   check your blood sugar twice a day.  vary the time of day when you check, between before the 3 meals, and at bedtime.  also check if you have symptoms of your blood sugar being too high or too low.  please keep a record of the readings and bring it to your next appointment here (or you can bring the meter itself).  You can write it on any piece of paper.  please call us sooner if your blood sugar goes below 70, or if you have a lot of readings over 200.

## 2017-10-21 ENCOUNTER — Other Ambulatory Visit: Payer: Self-pay | Admitting: Family Medicine

## 2017-10-29 ENCOUNTER — Telehealth: Payer: Self-pay | Admitting: Endocrinology

## 2017-10-29 NOTE — Telephone Encounter (Signed)
Patient stated that she would like to know if Dr Loanne Drilling could change her insulin to the bill form.  Please advise    [id number]- [place], [place] - [address]    Insulin Glargine (LANTUS) 100 UNIT/ML Solostar Pen

## 2017-10-30 NOTE — Telephone Encounter (Signed)
Cheapest option is NPH from [facility].  No prescription is needed.  Start with 30 units qam.  Make sure no hypoglycemia before you increase to 40 qam

## 2017-10-30 NOTE — Telephone Encounter (Signed)
Pt informed

## 2017-10-30 NOTE — Telephone Encounter (Signed)
Please advise 

## 2017-10-30 NOTE — Telephone Encounter (Signed)
please call patient: I do not understand what pt is saying.  Please clarify.

## 2017-10-30 NOTE — Telephone Encounter (Signed)
Pt stated that she lost her insurance and she would like an inexpensive alternative to her insulin maybe a pill form of an equivalent medication

## 2017-10-31 ENCOUNTER — Telehealth: Payer: Self-pay | Admitting: Endocrinology

## 2017-10-31 MED ORDER — INSULIN DETEMIR 100 UNIT/ML FLEXPEN: 60.0000 [IU] | PEN_INJECTOR | SUBCUTANEOUS | 11 refills | Status: DC

## 2017-10-31 NOTE — Telephone Encounter (Signed)
please call patient: Health dept says they can possibly get you free levemir.  I have sent a prescription to your pharmacy.  This is not a 1-for-1 change, so please start at 60 units qam, and call 1 week later, to tell us how cbg is doing.

## 2017-10-31 NOTE — Telephone Encounter (Signed)
Left vm for pt with instructions

## 2017-12-02 ENCOUNTER — Ambulatory Visit: Payer: BLUE CROSS/BLUE SHIELD | Admitting: Endocrinology

## 2017-12-16 ENCOUNTER — Ambulatory Visit: Payer: Self-pay | Admitting: Endocrinology

## 2017-12-16 DIAGNOSIS — Z0289 Encounter for other administrative examinations: Secondary | ICD-10-CM

## 2018-03-27 ENCOUNTER — Ambulatory Visit: Payer: Self-pay | Admitting: Endocrinology

## 2018-03-27 ENCOUNTER — Encounter: Payer: Self-pay | Admitting: Endocrinology

## 2018-03-27 VITALS — BP 132/70 | HR 86 | Ht 67.5 in | Wt 276.8 lb

## 2018-03-27 DIAGNOSIS — E103299 Type 1 diabetes mellitus with mild nonproliferative diabetic retinopathy without macular edema, unspecified eye: Secondary | ICD-10-CM

## 2018-03-27 LAB — POCT GLYCOSYLATED HEMOGLOBIN (HGB A1C): Hemoglobin A1C: 5.9 % — AB (ref 4.0–5.6)

## 2018-03-27 MED ORDER — INSULIN DETEMIR 100 UNIT/ML FLEXPEN: 50.0000 [IU] | PEN_INJECTOR | SUBCUTANEOUS | 11 refills | Status: DC

## 2018-03-27 NOTE — Progress Notes (Signed)
Subjective:    Patient ID: Suzanne Duke, female    DOB: 01-Apr-1960, 58 y.o.   MRN: [id number]  HPI Pt returns for f/u of diabetes mellitus: DM type: insulin-requiring type 2 Dx'ed:1992, during a pregnancy, but it persisted after delivery.  Complications: NPDR.  Therapy: insulin since 2014. DKA: never Severe hypoglycemia: last episode was 2009.   Pancreatitis: never.  Other: she took insulin until she had gastric bypass in 2008; she weighed 492 prior to the surgery (she declines repeat surgery); she was then controlled with orals until mid-2014, when she needed to resume the insulin; she declines multiple daily injections.   Interval history: no cbg record, but states cbg's are well-controlled.  She takes 60 units qam.  She has occasional hypoglycemia, and these episodes are mild.  She has lost her insurance.   Past Medical History:  Diagnosis Date  . ANEMIA-IRON DEFICIENCY 02/17/2007  . ANXIETY 02/17/2007  . Arthritis   . DEPRESSION 02/17/2007  . DM 07/17/2007  . Edema 07/21/2008  . GERD 05/11/2010  . GOITER, NONTOXIC MULTINODULAR 01/07/2007  . HYPERTENSION 09/21/2006  . LUMBAR RADICULOPATHY 01/13/2007  . Morbid obesity ([place])    lost >200 lbs following bariatric surgery  . UNSPECIFIED NEURALGIA NEURITIS AND RADICULITIS 01/07/2007    Past Surgical History:  Procedure Laterality Date  . CHOLECYSTECTOMY  1992  . ELECTROCARDIOGRAM  08/22/2005  . GASTRIC BYPASS OPEN  2008  . INCISION AND DRAINAGE PERIRECTAL ABSCESS  1989   s/p  . TREATMENT FISTULA ANAL  1993    Social History   Socioeconomic History  . Marital status: Married    Spouse name: Not on file  . Number of children: Not on file  . Years of education: Not on file  . Highest education level: Not on file  Occupational History  . Not on file  Social Needs  . Financial resource strain: Not on file  . Food insecurity:    Worry: Not on file    Inability: Not on file  . Transportation needs:    Medical: Not on  file    Non-medical: Not on file  Tobacco Use  . Smoking status: Current Every Day Smoker    Packs/day: 0.50    Types: Cigarettes  . Smokeless tobacco: Never Used  Substance and Sexual Activity  . Alcohol use: No  . Drug use: No  . Sexual activity: Yes    Birth control/protection: Injection  Lifestyle  . Physical activity:    Days per week: Not on file    Minutes per session: Not on file  . Stress: Not on file  Relationships  . Social connections:    Talks on phone: Not on file    Gets together: Not on file    Attends religious service: Not on file    Active member of club or organization: Not on file    Attends meetings of clubs or organizations: Not on file    Relationship status: Not on file  . Intimate partner violence:    Fear of current or ex partner: Not on file    Emotionally abused: Not on file    Physically abused: Not on file    Forced sexual activity: Not on file  Other Topics Concern  . Not on file  Social History Narrative  . Not on file    Current Outpatient Medications on File Prior to Visit  Medication Sig Dispense Refill  . albuterol (PROVENTIL HFA;VENTOLIN HFA) 108 (90 Base) MCG/ACT inhaler Inhale 2  puffs into the lungs every 4 (four) hours as needed for wheezing or shortness of breath (cough, shortness of breath or wheezing.). 1 Inhaler 1  . benzonatate (TESSALON) 200 MG capsule Take 1 capsule (200 mg total) by mouth 3 (three) times daily as needed for cough. 30 capsule 0  . calcium carbonate (OS-CAL) 600 MG TABS Take 600 mg by mouth 2 (two) times daily.    . cetirizine (ZYRTEC) 10 MG tablet Take 1 tablet (10 mg total) by mouth at bedtime. 30 tablet 11  . DULERA 200-5 MCG/ACT AERO TAKE 2 PUFFS BY MOUTH TWICE A DAY 13 Inhaler 0  . Ferrous Sulfate (IRON) 325 (65 FE) MG TABS Take 1 tablet by mouth 2 (two) times daily.     . fluticasone (FLONASE) 50 MCG/ACT nasal spray PLACE 2 SPRAYS INTO BOTH NOSTRILS AT BEDTIME. 16 g 1  . glucose blood (ONETOUCH VERIO)  test strip 1 each by Other route 2 (two) times daily. And lancets 2/day 250.03 100 each 12  . glucose blood test strip 1 each by Other route daily. And lancets 1/day 50 each 11  . losartan (COZAAR) 50 MG tablet TAKE 1 TABLET BY MOUTH EVERY DAY 90 tablet 1  . Multiple Vitamin (MULTIVITAMIN) tablet Take 1 tablet by mouth 2 (two) times daily.    . naproxen (NAPROSYN) 500 MG tablet Take 1 tablet (500 mg total) by mouth 2 (two) times daily with a meal. 180 tablet 4  . omeprazole (PRILOSEC) 40 MG capsule Take 1 capsule (40 mg total) by mouth daily. 90 capsule 2  . sertraline (ZOLOFT) 50 MG tablet TAKE 3 TABLETS BY MOUTH EVERY DAY 270 tablet 1  . vitamin B-12 (CYANOCOBALAMIN) 1000 MCG tablet Take 1,000 mcg by mouth daily.     No current facility-administered medications on file prior to visit.     Allergies  Allergen Reactions  . Sulfonamide Derivatives     Family History  Problem Relation Age of Onset  . Cancer Mother        Breast Cancer  . Diabetes Mother   . Renal Disease Mother   . Heart disease Mother   . Hyperlipidemia Mother   . Cancer Sister 59       Colon Cancer  . Hypertension Father   . Cancer Father        Prostate  . Diabetes Father   . Arthritis Father   . Diabetes Sister   . Diabetes Brother   . Hypertension Brother     BP 132/70 (BP Location: Left Arm, Patient Position: Sitting, Cuff Size: Large)   Pulse 86   Ht 5' 7.5" (1.715 m)   Wt 276 lb 12.8 oz (125.6 kg)   SpO2 97%   BMI 42.71 kg/m   Review of Systems She has lost weight, due to her efforts.      Objective:   Physical Exam VITAL SIGNS:  See vs page GENERAL: no distress Pulses: dorsalis pedis intact bilat.   MSK: no deformity of the feet CV: trace bilat leg edema Skin:  no ulcer on the feet.  normal color and temp on the feet. Neuro: sensation is intact to touch on the feet  Lab Results  Component Value Date   HGBA1C 5.9 (A) 03/27/2018       Assessment & Plan:  Insulin-requiring type 2  DM, with DR: overcontrolled.   Patient Instructions  Please reduce the insulin to 50 units each morning.   Please come back for a follow-up appointment in  4 months.   Please continue your weight loss efforts.   check your blood sugar twice a day.  vary the time of day when you check, between before the 3 meals, and at bedtime.  also check if you have symptoms of your blood sugar being too high or too low.  please keep a record of the readings and bring it to your next appointment here (or you can bring the meter itself).  You can write it on any piece of paper.  please call us sooner if your blood sugar goes below 70, or if you have a lot of readings over 200.

## 2018-03-27 NOTE — Patient Instructions (Signed)
Please reduce the insulin to 50 units each morning.   Please come back for a follow-up appointment in 4 months.   Please continue your weight loss efforts.   check your blood sugar twice a day.  vary the time of day when you check, between before the 3 meals, and at bedtime.  also check if you have symptoms of your blood sugar being too high or too low.  please keep a record of the readings and bring it to your next appointment here (or you can bring the meter itself).  You can write it on any piece of paper.  please call us sooner if your blood sugar goes below 70, or if you have a lot of readings over 200.

## 2018-07-25 ENCOUNTER — Ambulatory Visit: Payer: Self-pay | Admitting: Endocrinology

## 2018-09-01 ENCOUNTER — Encounter: Payer: Self-pay | Admitting: Endocrinology

## 2018-09-01 ENCOUNTER — Other Ambulatory Visit: Payer: Self-pay

## 2018-09-01 ENCOUNTER — Ambulatory Visit: Payer: Self-pay | Admitting: Endocrinology

## 2018-09-01 VITALS — BP 152/80 | HR 100 | Ht 67.5 in | Wt 257.6 lb

## 2018-09-01 DIAGNOSIS — E103299 Type 1 diabetes mellitus with mild nonproliferative diabetic retinopathy without macular edema, unspecified eye: Secondary | ICD-10-CM

## 2018-09-01 LAB — POCT GLYCOSYLATED HEMOGLOBIN (HGB A1C): Hemoglobin A1C: 5.4 % (ref 4.0–5.6)

## 2018-09-01 MED ORDER — LEVEMIR FLEXTOUCH 100 UNIT/ML ~~LOC~~ SOPN: 30.0000 [IU] | PEN_INJECTOR | SUBCUTANEOUS | 11 refills | Status: DC

## 2018-09-01 NOTE — Progress Notes (Signed)
Subjective:    Patient ID: Suzanne Duke, female    DOB: 10/23/1960, 58 y.o.   MRN: [id number]  HPI Pt returns for f/u of diabetes mellitus: DM type: insulin-requiring type 2 Dx'ed:1992, during a pregnancy, but it persisted after delivery.  Complications: NPDR.  Therapy: insulin since 2014. DKA: never Severe hypoglycemia: last episode was 2009.   Pancreatitis: never.  Other: she took insulin until she had gastric bypass in 2008; she weighed 492 prior to the surgery (she declines repeat surgery); she was then controlled with orals until mid-2014, when she needed to resume the insulin; she declines multiple daily injections.   Interval history: no cbg record, but states cbg's are well-controlled.  She takes 50 units qam.  She has occasional hypoglycemia several times per week, and these episodes are mild.   Past Medical History:  Diagnosis Date  . ANEMIA-IRON DEFICIENCY 02/17/2007  . ANXIETY 02/17/2007  . Arthritis   . DEPRESSION 02/17/2007  . DM 07/17/2007  . Edema 07/21/2008  . GERD 05/11/2010  . GOITER, NONTOXIC MULTINODULAR 01/07/2007  . HYPERTENSION 09/21/2006  . LUMBAR RADICULOPATHY 01/13/2007  . Morbid obesity ([place])    lost >200 lbs following bariatric surgery  . UNSPECIFIED NEURALGIA NEURITIS AND RADICULITIS 01/07/2007    Past Surgical History:  Procedure Laterality Date  . CHOLECYSTECTOMY  1992  . ELECTROCARDIOGRAM  08/22/2005  . GASTRIC BYPASS OPEN  2008  . INCISION AND DRAINAGE PERIRECTAL ABSCESS  1989   s/p  . TREATMENT FISTULA ANAL  1993    Social History   Socioeconomic History  . Marital status: Married    Spouse name: Not on file  . Number of children: Not on file  . Years of education: Not on file  . Highest education level: Not on file  Occupational History  . Not on file  Social Needs  . Financial resource strain: Not on file  . Food insecurity    Worry: Not on file    Inability: Not on file  . Transportation needs    Medical: Not on file   Non-medical: Not on file  Tobacco Use  . Smoking status: Current Every Day Smoker    Packs/day: 0.50    Types: Cigarettes  . Smokeless tobacco: Never Used  Substance and Sexual Activity  . Alcohol use: No  . Drug use: No  . Sexual activity: Yes    Birth control/protection: Injection  Lifestyle  . Physical activity    Days per week: Not on file    Minutes per session: Not on file  . Stress: Not on file  Relationships  . Social Herbalist on phone: Not on file    Gets together: Not on file    Attends religious service: Not on file    Active member of club or organization: Not on file    Attends meetings of clubs or organizations: Not on file    Relationship status: Not on file  . Intimate partner violence    Fear of current or ex partner: Not on file    Emotionally abused: Not on file    Physically abused: Not on file    Forced sexual activity: Not on file  Other Topics Concern  . Not on file  Social History Narrative  . Not on file    Current Outpatient Medications on File Prior to Visit  Medication Sig Dispense Refill  . albuterol (PROVENTIL HFA;VENTOLIN HFA) 108 (90 Base) MCG/ACT inhaler Inhale 2 puffs into the lungs  every 4 (four) hours as needed for wheezing or shortness of breath (cough, shortness of breath or wheezing.). 1 Inhaler 1  . calcium carbonate (OS-CAL) 600 MG TABS Take 600 mg by mouth 2 (two) times daily.    . cetirizine (ZYRTEC) 10 MG tablet Take 1 tablet (10 mg total) by mouth at bedtime. 30 tablet 11  . DULERA 200-5 MCG/ACT AERO TAKE 2 PUFFS BY MOUTH TWICE A DAY 13 Inhaler 0  . Ferrous Sulfate (IRON) 325 (65 FE) MG TABS Take 1 tablet by mouth 2 (two) times daily.     . fluticasone (FLONASE) 50 MCG/ACT nasal spray PLACE 2 SPRAYS INTO BOTH NOSTRILS AT BEDTIME. 16 g 1  . glucose blood (ONETOUCH VERIO) test strip 1 each by Other route 2 (two) times daily. And lancets 2/day 250.03 (Patient taking differently: 1 each by Other route 2 (two) times daily.  ) 100 each 12  . losartan (COZAAR) 50 MG tablet TAKE 1 TABLET BY MOUTH EVERY DAY 90 tablet 1  . Multiple Vitamin (MULTIVITAMIN) tablet Take 1 tablet by mouth 2 (two) times daily.    . naproxen (NAPROSYN) 500 MG tablet Take 1 tablet (500 mg total) by mouth 2 (two) times daily with a meal. 180 tablet 4  . omeprazole (PRILOSEC) 40 MG capsule Take 1 capsule (40 mg total) by mouth daily. 90 capsule 2  . sertraline (ZOLOFT) 50 MG tablet TAKE 3 TABLETS BY MOUTH EVERY DAY 270 tablet 1  . vitamin B-12 (CYANOCOBALAMIN) 1000 MCG tablet Take 1,000 mcg by mouth daily.     No current facility-administered medications on file prior to visit.     Allergies  Allergen Reactions  . Sulfonamide Derivatives     Family History  Problem Relation Age of Onset  . Cancer Mother        Breast Cancer  . Diabetes Mother   . Renal Disease Mother   . Heart disease Mother   . Hyperlipidemia Mother   . Cancer Sister 29       Colon Cancer  . Hypertension Father   . Cancer Father        Prostate  . Diabetes Father   . Arthritis Father   . Diabetes Sister   . Diabetes Brother   . Hypertension Brother     BP (!) 152/80 (BP Location: Left Arm, Patient Position: Sitting, Cuff Size: Large)   Pulse 100   Ht 5' 7.5" (1.715 m)   Wt 257 lb 9.6 oz (116.8 kg)   SpO2 97%   BMI 39.75 kg/m    Review of Systems She has lost 19 more lbs, due to her efforts    Objective:   Physical Exam VITAL SIGNS:  See vs page GENERAL: no distress Pulses: dorsalis pedis intact bilat.   MSK: no deformity of the feet CV: trace bilat leg edema Skin:  no ulcer on the feet.  normal color and temp on the feet. Neuro: sensation is intact to touch on the feet     Lab Results  Component Value Date   HGBA1C 5.4 09/01/2018       Assessment & Plan:  Insulin-requiring type 2 DM: overcontrolled Obesity: improved with renewed dietary efforts. HTN: is noted today.  Edema: This limits rx options.    Patient Instructions   Please reduce the insulin to 30 units each morning.   Your blood pressure is high today.  Please see a primary care provider soon, to have it rechecked. Please come back for a follow-up  appointment in 4 months.   Please continue your weight loss efforts.   check your blood sugar twice a day.  vary the time of day when you check, between before the 3 meals, and at bedtime.  also check if you have symptoms of your blood sugar being too high or too low.  please keep a record of the readings and bring it to your next appointment here (or you can bring the meter itself).  You can write it on any piece of paper.  please call us sooner if your blood sugar goes below 70, or if you have a lot of readings over 200.

## 2018-09-01 NOTE — Patient Instructions (Addendum)
Please reduce the insulin to 30 units each morning.   Your blood pressure is high today.  Please see a primary care provider soon, to have it rechecked. Please come back for a follow-up appointment in 4 months.   Please continue your weight loss efforts.   check your blood sugar twice a day.  vary the time of day when you check, between before the 3 meals, and at bedtime.  also check if you have symptoms of your blood sugar being too high or too low.  please keep a record of the readings and bring it to your next appointment here (or you can bring the meter itself).  You can write it on any piece of paper.  please call us sooner if your blood sugar goes below 70, or if you have a lot of readings over 200.

## 2018-09-03 ENCOUNTER — Ambulatory Visit: Payer: Self-pay | Admitting: Endocrinology

## 2018-09-17 ENCOUNTER — Ambulatory Visit (INDEPENDENT_AMBULATORY_CARE_PROVIDER_SITE_OTHER): Payer: Self-pay | Admitting: Family Medicine

## 2018-09-17 ENCOUNTER — Other Ambulatory Visit: Payer: Self-pay

## 2018-09-17 VITALS — BP 172/91 | HR 70 | Temp 98.6°F | Ht 67.5 in | Wt 255.2 lb

## 2018-09-17 DIAGNOSIS — J4521 Mild intermittent asthma with (acute) exacerbation: Secondary | ICD-10-CM

## 2018-09-17 DIAGNOSIS — I10 Essential (primary) hypertension: Secondary | ICD-10-CM

## 2018-09-17 DIAGNOSIS — E042 Nontoxic multinodular goiter: Secondary | ICD-10-CM

## 2018-09-17 MED ORDER — LOSARTAN POTASSIUM 100 MG PO TABS: 50.0000 mg | ORAL_TABLET | Freq: Every day | ORAL | 5 refills | Status: DC

## 2018-09-17 NOTE — Progress Notes (Signed)
7/22/20203:27 PM  Suzanne Duke 1960/07/01, 58 y.o., female [id number]  Chief Complaint  Patient presents with  . Hypertension/Goiter/Allergies    med refill    HPI:  HTN-taking Losartan 32m-takes daily, no headaches, no visual changes currently-does not check bp at home Took dulera and albuterol for allergies for several weeks 2/19-would like refill on medication-no formal diagnosis of asthma-currently no SOB/cough/wheezing DM-type 1-sees endo Goiter-pt has noted swelling on the right side of the neck-know goiter but increase size is concerning Fall Risk  09/17/2018 04/13/2017 11/25/2015 11/21/2015  Falls in the past year? 0 No No No  Number falls in past yr: 0 - - -  Injury with Fall? 0 - - -   [facility] 09/17/2018 04/13/2017 11/25/2015  Decreased Interest 0 0 0  Down, Depressed, Hopeless 0 0 0  PHQ - 2 Score 0 0 0    Allergies  Allergen Reactions  . Sulfonamide Derivatives     Prior to Admission medications   Medication Sig Start Date End Date Taking? Authorizing Provider  albuterol (PROVENTIL HFA;VENTOLIN HFA) 108 (90 Base) MCG/ACT inhaler Inhale 2 puffs into the lungs every 4 (four) hours as needed for wheezing or shortness of breath (cough, shortness of breath or wheezing.). 04/13/17   SShawnee Knapp MD  calcium carbonate (OS-CAL) 600 MG TABS Take 600 mg by mouth 2 (two) times daily.    [provider]  cetirizine (ZYRTEC) 10 MG tablet Take 1 tablet (10 mg total) by mouth at bedtime. 04/13/17   SShawnee Knapp MD  DULERA 200-5 MCG/ACT AERO TAKE 2 PUFFS BY MOUTH TWICE A DAY 05/10/17   SShawnee Knapp MD  Ferrous Sulfate (IRON) 325 (65 FE) MG TABS Take 1 tablet by mouth 2 (two) times daily.     [provider]  fluticasone (FLONASE) 50 MCG/ACT nasal spray PLACE 2 SPRAYS INTO BOTH NOSTRILS AT BEDTIME. 07/04/17   SShawnee Knapp MD  glucose blood (ONETOUCH VERIO) test strip 1 each by Other route 2 (two) times daily. And lancets 2/day 250.03 Patient taking  differently: 1 each by Other route 2 (two) times daily.  11/24/13   ERenato Shin MD  Insulin Detemir (LEVEMIR FLEXTOUCH) 100 UNIT/ML Pen Inject 30 Units into the skin every morning. and pen needles 1/day 09/01/18   ERenato Shin MD  losartan (COZAAR) 50 MG tablet TAKE 1 TABLET BY MOUTH EVERY DAY 10/21/17   SShawnee Knapp MD  Multiple Vitamin (MULTIVITAMIN) tablet Take 1 tablet by mouth 2 (two) times daily.    [provider]  naproxen (NAPROSYN) 500 MG tablet Take 1 tablet (500 mg total) by mouth 2 (two) times daily with a meal. 12/14/16   ERenato Shin MD  omeprazole (PRILOSEC) 40 MG capsule Take 1 capsule (40 mg total) by mouth daily. 12/14/16   ERenato Shin MD  sertraline (ZOLOFT) 50 MG tablet TAKE 3 TABLETS BY MOUTH EVERY DAY 05/08/17   ERenato Shin MD  vitamin B-12 (CYANOCOBALAMIN) 1000 MCG tablet Take 1,000 mcg by mouth daily.    [provider]    Past Medical History:  Diagnosis Date  . ANEMIA-IRON DEFICIENCY 02/17/2007  . ANXIETY 02/17/2007  . Arthritis   . DEPRESSION 02/17/2007  . DM 07/17/2007  . Edema 07/21/2008  . GERD 05/11/2010  . GOITER, NONTOXIC MULTINODULAR 01/07/2007  . HYPERTENSION 09/21/2006  . LUMBAR RADICULOPATHY 01/13/2007  . [place]    lost >200 lbs following bariatric surgery  . UNSPECIFIED NEURALGIA NEURITIS AND  RADICULITIS 01/07/2007    Past Surgical History:  Procedure Laterality Date  . CHOLECYSTECTOMY  1992  . ELECTROCARDIOGRAM  08/22/2005  . GASTRIC BYPASS OPEN  2008  . INCISION AND DRAINAGE PERIRECTAL ABSCESS  1989   s/p  . TREATMENT FISTULA ANAL  1993    Social History   Tobacco Use  . Smoking status: Current Every Day Smoker    Packs/day: 0.50    Types: Cigarettes  . Smokeless tobacco: Never Used  Substance Use Topics  . Alcohol use: No    Social History   Social History Narrative  . Not on file    Family History  Problem Relation Age of Onset  . Cancer Mother        Breast Cancer  . Diabetes  Mother   . Renal Disease Mother   . Heart disease Mother   . Hyperlipidemia Mother   . Cancer Sister 25       Colon Cancer  . Hypertension Father   . Cancer Father        Prostate  . Diabetes Father   . Arthritis Father   . Diabetes Sister   . Diabetes Brother   . Hypertension Brother     ROS: Constitutional: no loss of appetite,  unexplained weight loss/gain  HEENT: no difficulty with hearing, or sinus problems, swelling right side of neck? goiter CV: no irregular heartbeat, orchest pain  RESP: no short of breath,  prolonged cough, wheezing, sputum production GI: no heartburn, constipation, diarrhea GU: no painful urination, frequent of urination Endo: DM 1 DM Allergy: cough,sneezing   Today's Vitals   09/17/18 1506  BP: (!) 172/91  Pulse: 70  Temp: 98.6 F (37 C)  TempSrc: Oral  SpO2: 98%  Weight: 255 lb 3.2 oz (115.8 kg)  Height: 5' 7.5" (1.715 m)  PainSc: 0-No pain   Body mass index is 39.38 kg/m.   EXAM:  Constitutional:  HEENT: normocephalic, atraumatic,Nose clear drainage with no lesions and no swelling of the turbinates Thyroid:  Enlargement-right side enlargement Lungs: clear breath sounds to auscultation Heart: no lifts or heaves , normal S1/2 with no murmur on auscultation Neuro: awake and alert, oriented to person, place and time, normal gait and balance   ASSESSMENT/PLAN: 1. GOITER, NONTOXIC MULTINODULAR Enlarged thyroid-h/o goiter - US Soft Tissue Head/Neck; Future  2. Essential hypertension Not well controlled-increase dose to 111m daily-recheck 1 month - Microalbumin, urine  3. Mild intermittent reactive airway disease with acute exacerbation Albuterol and dulera-consider allergy testing and referral to asthma and allergy - Microalbumin, urine LBenny Lennert MD

## 2018-09-17 NOTE — Patient Instructions (Signed)
Ultrasound for thyroid enlargement Check urine for protein Increase your dose of Cozaar to 149m daily-check blood pressure daily in the early morning

## 2018-09-17 NOTE — Progress Notes (Signed)
CC: Needs refill on Losartin 50 mg at bedtime

## 2018-09-18 DIAGNOSIS — J45909 Unspecified asthma, uncomplicated: Secondary | ICD-10-CM | POA: Insufficient documentation

## 2018-11-10 ENCOUNTER — Telehealth: Payer: Self-pay | Admitting: Endocrinology

## 2018-11-10 ENCOUNTER — Other Ambulatory Visit: Payer: Self-pay

## 2018-11-10 DIAGNOSIS — E103299 Type 1 diabetes mellitus with mild nonproliferative diabetic retinopathy without macular edema, unspecified eye: Secondary | ICD-10-CM

## 2018-11-10 MED ORDER — LEVEMIR FLEXTOUCH 100 UNIT/ML ~~LOC~~ SOPN: 30.0000 [IU] | PEN_INJECTOR | SUBCUTANEOUS | 11 refills | Status: DC

## 2018-11-10 NOTE — Telephone Encounter (Signed)
Insulin Detemir (LEVEMIR FLEXTOUCH) 100 UNIT/ML Pen 15 pen 11 11/10/2018    Sig - Route: Inject 30 Units into the skin every morning. and pen needles 1/day - Subcutaneous   Sent to pharmacy as: Insulin Detemir (LEVEMIR FLEXTOUCH) 100 UNIT/ML Pen   E-Prescribing Status: Receipt confirmed by pharmacy (11/10/2018 2:39 PM EDT)

## 2018-11-10 NOTE — Telephone Encounter (Signed)
MEDICATION: Insulin Detemir (LEVEMIR FLEXTOUCH) 100 UNIT/ML Pen  PHARMACY:  [facility] [address] [facility] [place]  IS THIS A 90 DAY SUPPLY : 30 day  IS PATIENT OUT OF MEDICATION:   IF NOT; HOW MUCH IS LEFT: 2 pens  LAST APPOINTMENT DATE: @7 /07/2018  NEXT APPOINTMENT DATE:@Visit  date not found  DO WE HAVE YOUR PERMISSION TO LEAVE A DETAILED MESSAGE:  OTHER COMMENTS:    **Let patient know to contact pharmacy at the end of the day to make sure medication is ready. **  ** Please notify patient to allow 48-72 hours to process**  **Encourage patient to contact the pharmacy for refills or they can request refills through MYCHART**0

## 2018-12-11 ENCOUNTER — Telehealth: Payer: Self-pay

## 2018-12-11 NOTE — Telephone Encounter (Signed)
Patient called in stating that her medication Insulin Detemir (LEVEMIR FLEXTOUCH) 100 UNIT/ML Pen. Patient has switched insurance and they will no longer pay for the medication states needs to be switched to something new insurance will cover.  [facility] call pharmacy [facility] [place]   Please advise

## 2018-12-12 NOTE — Telephone Encounter (Signed)
Following message sent via My Chart:  December 12, 2018  [place] [address]  Dear Ms. Bristow  We received notification from you that your insurance has changed and that Levemir is no longer covered by your plan.  To ensure accuracy and efficiency, please reach out to your insurance company to inquire further about what they will now cover under your current plan. Once you have gathered this information, please call our office. We will be happy to send whatever is now covered by your plan.   Sincerely,  [facility]

## 2018-12-16 ENCOUNTER — Telehealth: Payer: Self-pay

## 2018-12-16 ENCOUNTER — Telehealth: Payer: Self-pay | Admitting: Endocrinology

## 2018-12-16 MED ORDER — LANTUS SOLOSTAR 100 UNIT/ML ~~LOC~~ SOPN: 20.0000 [IU] | PEN_INJECTOR | SUBCUTANEOUS | 99 refills | Status: DC

## 2018-12-16 NOTE — Telephone Encounter (Signed)
Pt called after hours asking for a "Rx covered by her insurance". Following was sent to pt via My Chart:  Following message sent via My Chart:  December 12, 2018  [place]  [id number]  Dear Suzanne Duke  We received notification from you that your insurance has changed and that Levemir is no longer covered by your plan.  To ensure accuracy and efficiency, please reach out to your insurance company to inquire further about what they will now cover under your current plan. Once you have gathered this information, please call our office. We will be happy to send whatever is now covered by your plan.   Sincerely,  [facility]  This is not something we are able to assist with at this time. It is imperative that pt CALL her insurance company to discuss with them about WHAT is covered.

## 2018-12-16 NOTE — Telephone Encounter (Signed)
Closed as duplicate

## 2018-12-16 NOTE — Telephone Encounter (Signed)
Pt called in to report that she HAD called her insurance company but that who she spoke to clearly did not understand. She further added that she attempted to inform front desk that the only insulin covered by her plan is Lantus Solostar and Toujeo. Apologized for the inconvenience and reassured that this information will be forwarded to Dr. Loanne Drilling. Verbalized acceptance and understanding.  Previous encounter has been closed as duplicate as well as erroneous.

## 2018-12-16 NOTE — Telephone Encounter (Signed)
Called pt and made her aware of new orders. Verbalized acceptance and understanding. Scheduled 01/05/19 for f/u

## 2018-12-16 NOTE — Telephone Encounter (Signed)
I have sent a prescription to your pharmacy, to change to Lantus.  It would be 20 units each morning, as it is not a unit-for-unit conversion.  I'll see you next time.

## 2018-12-16 NOTE — Addendum Note (Signed)
Addended by: Renato Shin on: 12/16/2018 12:53 PM   Modules accepted: Orders

## 2018-12-16 NOTE — Telephone Encounter (Signed)
Patient requests to be called asap at ph# [phone] re: insulin. Patient is unable to get on MyChart to read any messages.

## 2019-01-01 ENCOUNTER — Other Ambulatory Visit: Payer: Self-pay

## 2019-01-05 ENCOUNTER — Other Ambulatory Visit: Payer: Self-pay

## 2019-01-05 ENCOUNTER — Ambulatory Visit: Payer: Self-pay | Admitting: Endocrinology

## 2019-01-05 ENCOUNTER — Encounter: Payer: Self-pay | Admitting: Endocrinology

## 2019-01-05 VITALS — BP 154/82 | HR 100 | Ht 67.5 in | Wt 249.4 lb

## 2019-01-05 DIAGNOSIS — Z23 Encounter for immunization: Secondary | ICD-10-CM

## 2019-01-05 DIAGNOSIS — E103299 Type 1 diabetes mellitus with mild nonproliferative diabetic retinopathy without macular edema, unspecified eye: Secondary | ICD-10-CM

## 2019-01-05 LAB — POCT GLYCOSYLATED HEMOGLOBIN (HGB A1C): Hemoglobin A1C: 5.9 % — AB (ref 4.0–5.6)

## 2019-01-05 MED ORDER — LANTUS SOLOSTAR 100 UNIT/ML ~~LOC~~ SOPN: 10.0000 [IU] | PEN_INJECTOR | SUBCUTANEOUS | 99 refills | Status: DC

## 2019-01-05 NOTE — Patient Instructions (Addendum)
Please reduce the insulin to 10 units each morning.   Your blood pressure is high today.  Please see a primary care provider soon, to have it rechecked.   Please come back for a follow-up appointment in 4 months.   Please continue your weight loss efforts.   check your blood sugar twice a day.  vary the time of day when you check, between before the 3 meals, and at bedtime.  also check if you have symptoms of your blood sugar being too high or too low.  please keep a record of the readings and bring it to your next appointment here (or you can bring the meter itself).  You can write it on any piece of paper.  please call us sooner if your blood sugar goes below 70, or if you have a lot of readings over 200.

## 2019-01-05 NOTE — Progress Notes (Signed)
Subjective:    Patient ID: Suzanne Duke, female    DOB: [birth year], 58 y.o.   MRN: [id number]  HPI Pt returns for f/u of diabetes mellitus: DM type: insulin-requiring type 2 Dx'ed:1992, during a pregnancy, but it persisted after delivery.  Complications: NPDR.  Therapy: insulin since 2014. DKA: never Severe hypoglycemia: last episode was 2009.   Pancreatitis: never.  Other: she took insulin until she had gastric bypass in 2008; she weighed 492 prior to the surgery (she declines repeat surgery); she was then controlled with orals until 2014, when she needed to resume the insulin; she declines multiple daily injections.   Interval history: no cbg record, but states cbg's are well-controlled.  She takes 20 units qam.  She denies hypoglycemia.  Past Medical History:  Diagnosis Date  . ANEMIA-IRON DEFICIENCY 02/17/2007  . ANXIETY 02/17/2007  . Arthritis   . DEPRESSION 02/17/2007  . DM 07/17/2007  . Edema 07/21/2008  . GERD 05/11/2010  . GOITER, NONTOXIC MULTINODULAR 01/07/2007  . HYPERTENSION 09/21/2006  . LUMBAR RADICULOPATHY 01/13/2007  . Morbid obesity ([place])    lost >200 lbs following bariatric surgery  . UNSPECIFIED NEURALGIA NEURITIS AND RADICULITIS 01/07/2007    Past Surgical History:  Procedure Laterality Date  . CHOLECYSTECTOMY  1992  . ELECTROCARDIOGRAM  08/22/2005  . GASTRIC BYPASS OPEN  2008  . INCISION AND DRAINAGE PERIRECTAL ABSCESS  1989   s/p  . TREATMENT FISTULA ANAL  1993    Social History   Socioeconomic History  . Marital status: Married    Spouse name: Not on file  . Number of children: Not on file  . Years of education: Not on file  . Highest education level: Not on file  Occupational History  . Not on file  Social Needs  . Financial resource strain: Not on file  . Food insecurity    Worry: Not on file    Inability: Not on file  . Transportation needs    Medical: Not on file    Non-medical: Not on file  Tobacco Use  . Smoking status: Current  Every Day Smoker    Packs/day: 0.50    Types: Cigarettes  . Smokeless tobacco: Never Used  Substance and Sexual Activity  . Alcohol use: No  . Drug use: No  . Sexual activity: Yes    Birth control/protection: Injection  Lifestyle  . Physical activity    Days per week: Not on file    Minutes per session: Not on file  . Stress: Not on file  Relationships  . Social Herbalist on phone: Not on file    Gets together: Not on file    Attends religious service: Not on file    Active member of club or organization: Not on file    Attends meetings of clubs or organizations: Not on file    Relationship status: Not on file  . Intimate partner violence    Fear of current or ex partner: Not on file    Emotionally abused: Not on file    Physically abused: Not on file    Forced sexual activity: Not on file  Other Topics Concern  . Not on file  Social History Narrative  . Not on file    Current Outpatient Medications on File Prior to Visit  Medication Sig Dispense Refill  . albuterol (PROVENTIL HFA;VENTOLIN HFA) 108 (90 Base) MCG/ACT inhaler Inhale 2 puffs into the lungs every 4 (four) hours as needed for wheezing or  shortness of breath (cough, shortness of breath or wheezing.). 1 Inhaler 1  . calcium carbonate (OS-CAL) 600 MG TABS Take 600 mg by mouth 2 (two) times daily.    . cetirizine (ZYRTEC) 10 MG tablet Take 1 tablet (10 mg total) by mouth at bedtime. 30 tablet 11  . DULERA 200-5 MCG/ACT AERO TAKE 2 PUFFS BY MOUTH TWICE A DAY 13 Inhaler 0  . Ferrous Sulfate (IRON) 325 (65 FE) MG TABS Take 1 tablet by mouth 2 (two) times daily.     . fluticasone (FLONASE) 50 MCG/ACT nasal spray PLACE 2 SPRAYS INTO BOTH NOSTRILS AT BEDTIME. 16 g 1  . glucose blood (ONETOUCH VERIO) test strip 1 each by Other route 2 (two) times daily. And lancets 2/day 250.03 (Patient taking differently: 1 each by Other route 2 (two) times daily. ) 100 each 12  . losartan (COZAAR) 100 MG tablet Take 0.5 tablets  (50 mg total) by mouth daily. 30 tablet 5  . Multiple Vitamin (MULTIVITAMIN) tablet Take 1 tablet by mouth 2 (two) times daily.    . naproxen (NAPROSYN) 500 MG tablet Take 1 tablet (500 mg total) by mouth 2 (two) times daily with a meal. 180 tablet 4  . omeprazole (PRILOSEC) 40 MG capsule Take 1 capsule (40 mg total) by mouth daily. 90 capsule 2  . sertraline (ZOLOFT) 50 MG tablet TAKE 3 TABLETS BY MOUTH EVERY DAY 270 tablet 1  . vitamin B-12 (CYANOCOBALAMIN) 1000 MCG tablet Take 1,000 mcg by mouth daily.     No current facility-administered medications on file prior to visit.     Allergies  Allergen Reactions  . Sulfonamide Derivatives     Family History  Problem Relation Age of Onset  . Cancer Mother        Breast Cancer  . Diabetes Mother   . Renal Disease Mother   . Heart disease Mother   . Hyperlipidemia Mother   . Cancer Sister 36       Colon Cancer  . Hypertension Father   . Cancer Father        Prostate  . Diabetes Father   . Arthritis Father   . Diabetes Sister   . Diabetes Brother   . Hypertension Brother     BP (!) 154/82 (BP Location: Left Arm, Patient Position: Sitting, Cuff Size: Large)   Pulse 100   Ht 5' 7.5" (1.715 m)   Wt 249 lb 6.4 oz (113.1 kg)   SpO2 94%   BMI 38.49 kg/m   Review of Systems She has lost 8 more lbs    Objective:   Physical Exam VITAL SIGNS:  See vs page GENERAL: no distress Pulses: dorsalis pedis intact bilat.   MSK: no deformity of the feet CV: 1+ bilat leg edema Skin:  no ulcer on the feet.  normal color and temp on the feet. Neuro: sensation is intact to touch on the feet.   Lab Results  Component Value Date   HGBA1C 5.9 (A) 01/05/2019        Assessment & Plan:  Insulin-requiring type 2 DM: overcontrolled: She can change insulin to another med, but she wants to use up her insulin supply.   Obesity: improved, due to her efforts. HTN: is noted today  Patient Instructions  Please reduce the insulin to 10 units  each morning.   Your blood pressure is high today.  Please see a primary care provider soon, to have it rechecked.   Please come back for a follow-up  appointment in 4 months.   Please continue your weight loss efforts.   check your blood sugar twice a day.  vary the time of day when you check, between before the 3 meals, and at bedtime.  also check if you have symptoms of your blood sugar being too high or too low.  please keep a record of the readings and bring it to your next appointment here (or you can bring the meter itself).  You can write it on any piece of paper.  please call us sooner if your blood sugar goes below 70, or if you have a lot of readings over 200.

## 2019-04-09 ENCOUNTER — Ambulatory Visit: Payer: Commercial Managed Care - PPO | Admitting: Endocrinology

## 2019-04-16 ENCOUNTER — Ambulatory Visit (INDEPENDENT_AMBULATORY_CARE_PROVIDER_SITE_OTHER): Payer: Commercial Managed Care - PPO | Admitting: Endocrinology

## 2019-04-16 ENCOUNTER — Other Ambulatory Visit: Payer: Self-pay

## 2019-04-16 ENCOUNTER — Encounter: Payer: Self-pay | Admitting: Endocrinology

## 2019-04-16 DIAGNOSIS — Z794 Long term (current) use of insulin: Secondary | ICD-10-CM | POA: Diagnosis not present

## 2019-04-16 DIAGNOSIS — E113299 Type 2 diabetes mellitus with mild nonproliferative diabetic retinopathy without macular edema, unspecified eye: Secondary | ICD-10-CM | POA: Diagnosis not present

## 2019-04-16 MED ORDER — LANTUS SOLOSTAR 100 UNIT/ML ~~LOC~~ SOPN: 10.0000 [IU] | PEN_INJECTOR | SUBCUTANEOUS | 99 refills | Status: DC

## 2019-04-16 NOTE — Progress Notes (Signed)
Subjective:    Patient ID: Suzanne Duke, female    DOB: December 29, 1960, 59 y.o.   MRN: [id number]  HPI telehealth visit today via doxy video visit.  Alternatives to telehealth are presented to this patient, and the patient agrees to the telehealth visit.  Pt is advised of the cost of the visit, and agrees to this, also.   Patient is at home, and I am at the office.   Persons attending the telehealth visit: the patient and I Pt returns for f/u of diabetes mellitus: DM type: insulin-requiring type 2 Dx'ed:1992, during a pregnancy, but it persisted after delivery.  Complications: NPDR.  Therapy: insulin since 2014.  DKA: never.   Severe hypoglycemia: last episode was 2009.   Pancreatitis: never.  Other: she took insulin until she had gastric bypass in 2008; she weighed 492 prior to the surgery (she declines repeat surgery); she was then controlled with orals until 2014, when she needed to resume the insulin; she declines multiple daily injections; she declines to change back to oral rx.   Interval history: no cbg record, but states cbg's are well-controlled.  She still takes 20 units qam.  She denies hypoglycemia.  She has lost weight--intentional.   Past Medical History:  Diagnosis Date  . ANEMIA-IRON DEFICIENCY 02/17/2007  . ANXIETY 02/17/2007  . Arthritis   . DEPRESSION 02/17/2007  . DM 07/17/2007  . Edema 07/21/2008  . GERD 05/11/2010  . GOITER, NONTOXIC MULTINODULAR 01/07/2007  . HYPERTENSION 09/21/2006  . LUMBAR RADICULOPATHY 01/13/2007  . Morbid obesity ([place])    lost >200 lbs following bariatric surgery  . UNSPECIFIED NEURALGIA NEURITIS AND RADICULITIS 01/07/2007    Past Surgical History:  Procedure Laterality Date  . CHOLECYSTECTOMY  1992  . ELECTROCARDIOGRAM  08/22/2005  . GASTRIC BYPASS OPEN  2008  . INCISION AND DRAINAGE PERIRECTAL ABSCESS  1989   s/p  . TREATMENT FISTULA ANAL  1993    Social History   Socioeconomic History  . Marital status: Married    Spouse  name: Not on file  . Number of children: Not on file  . Years of education: Not on file  . Highest education level: Not on file  Occupational History  . Not on file  Tobacco Use  . Smoking status: Current Every Day Smoker    Packs/day: 0.50    Types: Cigarettes  . Smokeless tobacco: Never Used  Substance and Sexual Activity  . Alcohol use: No  . Drug use: No  . Sexual activity: Yes    Birth control/protection: Injection  Other Topics Concern  . Not on file  Social History Narrative  . Not on file   Social Determinants of Health   Financial Resource Strain:   . Difficulty of Paying Living Expenses: Not on file  Food Insecurity:   . Worried About Charity fundraiser in the Last Year: Not on file  . Ran Out of Food in the Last Year: Not on file  Transportation Needs:   . Lack of Transportation (Medical): Not on file  . Lack of Transportation (Non-Medical): Not on file  Physical Activity:   . Days of Exercise per Week: Not on file  . Minutes of Exercise per Session: Not on file  Stress:   . Feeling of Stress : Not on file  Social Connections:   . Frequency of Communication with Friends and Family: Not on file  . Frequency of Social Gatherings with Friends and Family: Not on file  . Attends Religious  Services: Not on file  . Active Member of Clubs or Organizations: Not on file  . Attends Archivist Meetings: Not on file  . Marital Status: Not on file  Intimate Partner Violence:   . Fear of Current or Ex-Partner: Not on file  . Emotionally Abused: Not on file  . Physically Abused: Not on file  . Sexually Abused: Not on file    Current Outpatient Medications on File Prior to Visit  Medication Sig Dispense Refill  . albuterol (PROVENTIL HFA;VENTOLIN HFA) 108 (90 Base) MCG/ACT inhaler Inhale 2 puffs into the lungs every 4 (four) hours as needed for wheezing or shortness of breath (cough, shortness of breath or wheezing.). 1 Inhaler 1  . calcium carbonate (OS-CAL)  600 MG TABS Take 600 mg by mouth 2 (two) times daily.    . cetirizine (ZYRTEC) 10 MG tablet Take 1 tablet (10 mg total) by mouth at bedtime. 30 tablet 11  . DULERA 200-5 MCG/ACT AERO TAKE 2 PUFFS BY MOUTH TWICE A DAY 13 Inhaler 0  . Ferrous Sulfate (IRON) 325 (65 FE) MG TABS Take 1 tablet by mouth 2 (two) times daily.     . fluticasone (FLONASE) 50 MCG/ACT nasal spray PLACE 2 SPRAYS INTO BOTH NOSTRILS AT BEDTIME. 16 g 1  . glucose blood (ONETOUCH VERIO) test strip 1 each by Other route 2 (two) times daily. And lancets 2/day 250.03 (Patient taking differently: 1 each by Other route 2 (two) times daily. ) 100 each 12  . losartan (COZAAR) 100 MG tablet Take 0.5 tablets (50 mg total) by mouth daily. 30 tablet 5  . Multiple Vitamin (MULTIVITAMIN) tablet Take 1 tablet by mouth 2 (two) times daily.    . naproxen (NAPROSYN) 500 MG tablet Take 1 tablet (500 mg total) by mouth 2 (two) times daily with a meal. 180 tablet 4  . omeprazole (PRILOSEC) 40 MG capsule Take 1 capsule (40 mg total) by mouth daily. 90 capsule 2  . sertraline (ZOLOFT) 50 MG tablet TAKE 3 TABLETS BY MOUTH EVERY DAY 270 tablet 1  . vitamin B-12 (CYANOCOBALAMIN) 1000 MCG tablet Take 1,000 mcg by mouth daily.     No current facility-administered medications on file prior to visit.    Allergies  Allergen Reactions  . Sulfonamide Derivatives     Family History  Problem Relation Age of Onset  . Cancer Mother        Breast Cancer  . Diabetes Mother   . Renal Disease Mother   . Heart disease Mother   . Hyperlipidemia Mother   . Cancer Sister 110       Colon Cancer  . Hypertension Father   . Cancer Father        Prostate  . Diabetes Father   . Arthritis Father   . Diabetes Sister   . Diabetes Brother   . Hypertension Brother     There were no vitals taken for this visit.   Review of Systems She denies hypoglycemia.  She denies n/v    Objective:   Physical Exam       Assessment & Plan:  Insulin-requiring type 2  DM: overcontrolled Weight loss; in this setting, she can prob d/c insulin altogether, but she declines this for now.    Patient Instructions  Please reduce the insulin to 10 units each morning.   Please come back for a follow-up appointment in 3 months.   Please continue your weight loss efforts.   check your blood sugar twice  a day.  vary the time of day when you check, between before the 3 meals, and at bedtime.  also check if you have symptoms of your blood sugar being too high or too low.  please keep a record of the readings and bring it to your next appointment here (or you can bring the meter itself).  You can write it on any piece of paper.  please call us sooner if your blood sugar goes below 70, or if you have a lot of readings over 200.

## 2019-04-16 NOTE — Patient Instructions (Addendum)
Please reduce the insulin to 10 units each morning.   Please come back for a follow-up appointment in 3 months.   Please continue your weight loss efforts.   check your blood sugar twice a day.  vary the time of day when you check, between before the 3 meals, and at bedtime.  also check if you have symptoms of your blood sugar being too high or too low.  please keep a record of the readings and bring it to your next appointment here (or you can bring the meter itself).  You can write it on any piece of paper.  please call us sooner if your blood sugar goes below 70, or if you have a lot of readings over 200.

## 2019-09-03 ENCOUNTER — Other Ambulatory Visit: Payer: Self-pay | Admitting: Family Medicine

## 2019-09-29 ENCOUNTER — Other Ambulatory Visit: Payer: Self-pay

## 2019-09-29 ENCOUNTER — Other Ambulatory Visit: Payer: Self-pay | Admitting: Registered Nurse

## 2019-09-29 DIAGNOSIS — I10 Essential (primary) hypertension: Secondary | ICD-10-CM

## 2019-09-29 MED ORDER — LOSARTAN POTASSIUM 100 MG PO TABS: 50.0000 mg | ORAL_TABLET | Freq: Every day | ORAL | 3 refills | Status: DC

## 2019-09-29 NOTE — Telephone Encounter (Signed)
losartan (COZAAR) 100 MG tablet    Patient is requesting a refill.    [facility]  [id number] [place] [address]:  [phone] Fax:  [phone]

## 2019-09-29 NOTE — Telephone Encounter (Signed)
09/29/2019 PATIENT IS REQUESTING A REFILL ON HER LOSARTAN 100 mg. I HAVE SCHEDULED HER A TRANSFER OF CARE APPOINTMENT WITH DR. Carlota Raspberry FROM DR. SHAW ON Friday 12/04/2019 AT 3:00 pm. PATIENT WOULD LIKE SOME COURTESY REFILLS UNTIL HER APPOINTMENT. I WILL ROUTE THIS MESSAGE BACK TO THE CLINICAL TEAM.  [place]

## 2019-09-29 NOTE — Addendum Note (Signed)
Addended by: Anastasio Auerbach R on: 09/29/2019 05:35 PM   Modules accepted: Orders

## 2019-09-29 NOTE — Telephone Encounter (Signed)
Please schedule appt for [facility] patient have not been here in a year and non of the provider she seen is here in office any more.

## 2019-09-29 NOTE — Telephone Encounter (Signed)
Requested medication (s) are due for refill today: no  Requested medication (s) are on the active medication list: yes  Last refill:  09/17/2018  Future visit scheduled: no  Notes to clinic:  patient overdue for labs and follow up   Requested Prescriptions  Pending Prescriptions Disp Refills   losartan (COZAAR) 100 MG tablet 30 tablet 5    Sig: Take 0.5 tablets (50 mg total) by mouth daily.      Cardiovascular:  Angiotensin Receptor Blockers Failed - 09/29/2019  8:38 AM      Failed - Cr in normal range and within 180 days    Creat  Date Value Ref Range Status  11/25/2015 0.65 0.50 - 1.05 mg/dL Final    Comment:      For patients > or = 57 years of age: The upper reference limit for Creatinine is approximately 13% higher for people identified as African-American.      Creatinine, Ser  Date Value Ref Range Status  04/13/2017 0.69 0.57 - 1.00 mg/dL Final   Creatinine,U  Date Value Ref Range Status  10/20/2012 161.9 mg/dL Final   Creatinine, Urine  Date Value Ref Range Status  11/24/2013 126.4 mg/dL Final    Comment:    No reference range established.          Failed - K in normal range and within 180 days    Potassium  Date Value Ref Range Status  04/13/2017 4.5 3.5 - 5.2 mmol/L Final          Failed - Last BP in normal range    BP Readings from Last 1 Encounters:  01/05/19 (!) 154/82          Failed - Valid encounter within last 6 months    Recent Outpatient Visits           1 year ago GOITER, [place]   [facility] at [place], [place]   2 years ago Type 2 diabetes mellitus with mild nonproliferative retinopathy without macular edema, with long-term current use of insulin, unspecified laterality [facility])   [facility] at [facility], Laurey Arrow, MD   3 years ago Annual physical exam   [facility] at [facility], Laurey Arrow, MD   3 years ago Missed periods   Primary Care at [place], Fenton Malling, MD   7 years ago Hip pain, acute, right    Primary Care at [facility] [place], [place] - Patient is not pregnant

## 2019-11-27 ENCOUNTER — Telehealth: Payer: Self-pay | Admitting: General Practice

## 2019-11-27 NOTE — Telephone Encounter (Signed)
called pt LVM for pt to call back and reschedule TRANSFER OF CARE FROM DR. SHAW (PATIENT HAS UMR) appt was on 12/04/2019 with Dr. Carlota Raspberry cancelled appt Dr. Carlota Raspberry not available

## 2019-12-04 ENCOUNTER — Encounter: Payer: Self-pay | Admitting: Family Medicine

## 2020-03-01 ENCOUNTER — Other Ambulatory Visit: Payer: Self-pay | Admitting: Endocrinology

## 2020-03-01 ENCOUNTER — Other Ambulatory Visit: Payer: Self-pay | Admitting: Family Medicine

## 2020-03-01 DIAGNOSIS — E113299 Type 2 diabetes mellitus with mild nonproliferative diabetic retinopathy without macular edema, unspecified eye: Secondary | ICD-10-CM

## 2020-03-01 DIAGNOSIS — Z794 Long term (current) use of insulin: Secondary | ICD-10-CM

## 2020-03-01 NOTE — Telephone Encounter (Signed)
Medication Refill - Medication: Insulin Glargine (LANTUS SOLOSTAR) 100 UNIT/ML Solostar    Has the patient contacted their pharmacy? Yes.   (Agent: If no, request that the patient contact the pharmacy for the refill.) (Agent: If yes, when and what did the pharmacy advise?)  Preferred Pharmacy (with phone number or street name):  [id number] [place] Phone: [phone]: [phone]    Agent: Please be advised that RX refills may take up to 3 business days. We ask that you follow-up with your pharmacy.

## 2020-03-01 NOTE — Telephone Encounter (Signed)
Requested medication (s) are due for refill today: Yes  Requested medication (s) are on the active medication list: Yes  Last refill:  04/16/19  Future visit scheduled: Yes  Notes to clinic:  Refused earlier. Pt. Has an appointment scheduled.    Requested Prescriptions  Pending Prescriptions Disp Refills   insulin glargine (LANTUS SOLOSTAR) 100 UNIT/ML Solostar Pen      Sig: Inject 10 Units into the skin every morning. And pen needles 2/day      Endocrinology:  Diabetes - Insulins Failed - 03/01/2020  3:42 PM      Failed - HBA1C is between 0 and 7.9 and within 180 days    Hemoglobin A1C  Date Value Ref Range Status  01/05/2019 5.9 (A) 4.0 - 5.6 % Final   Hgb A1c MFr Bld  Date Value Ref Range Status  08/04/2014 7.0 (H) 4.6 - 6.5 % Final    Comment:    Glycemic Control Guidelines for People with Diabetes:Non Diabetic:  <6%Goal of Therapy: <7%Additional Action Suggested:  >8%           Failed - Valid encounter within last 6 months    Recent Outpatient Visits           1 year ago GOITER, NONTOXIC MULTINODULAR   Primary [facility] at [place], [place]   2 years ago Type 2 diabetes mellitus with mild nonproliferative retinopathy without macular edema, with long-term current use of insulin, unspecified laterality [facility])   [facility] at [facility], Laurey Arrow, MD   4 years ago Annual physical exam   [facility] at [facility], Laurey Arrow, MD   4 years ago Missed periods   Primary Care at [facility], Fenton Malling, MD   7 years ago Hip pain, acute, right   Primary Care at [facility], [place], [place]       Future Appointments             In 2 weeks Carlota Raspberry, Ranell Patrick, MD [facility] at [place], [facility]

## 2020-03-03 MED ORDER — LANTUS SOLOSTAR 100 UNIT/ML ~~LOC~~ SOPN: 10.0000 [IU] | PEN_INJECTOR | SUBCUTANEOUS | Status: DC

## 2020-03-07 ENCOUNTER — Telehealth: Payer: Self-pay | Admitting: Family Medicine

## 2020-03-07 ENCOUNTER — Other Ambulatory Visit: Payer: Self-pay

## 2020-03-07 DIAGNOSIS — Z794 Long term (current) use of insulin: Secondary | ICD-10-CM

## 2020-03-07 DIAGNOSIS — E113299 Type 2 diabetes mellitus with mild nonproliferative diabetic retinopathy without macular edema, unspecified eye: Secondary | ICD-10-CM

## 2020-03-07 MED ORDER — LANTUS SOLOSTAR 100 UNIT/ML ~~LOC~~ SOPN: 10.0000 [IU] | PEN_INJECTOR | SUBCUTANEOUS | Status: DC

## 2020-03-07 NOTE — Telephone Encounter (Signed)
error 

## 2020-03-11 ENCOUNTER — Other Ambulatory Visit: Payer: Self-pay | Admitting: Family Medicine

## 2020-03-11 DIAGNOSIS — E113299 Type 2 diabetes mellitus with mild nonproliferative diabetic retinopathy without macular edema, unspecified eye: Secondary | ICD-10-CM

## 2020-03-11 MED ORDER — LANTUS SOLOSTAR 100 UNIT/ML ~~LOC~~ SOPN: 10.0000 [IU] | PEN_INJECTOR | SUBCUTANEOUS | 0 refills | Status: DC

## 2020-03-11 NOTE — Telephone Encounter (Signed)
Copied from [place] [id number]. Topic: Quick Communication - Rx Refill/Question >> Mar 11, 2020  1:27 PM Lenon Curt, [place] A wrote: Medication: insulin glargine (LANTUS SOLOSTAR) 100 UNIT/ML Solostar Pen (patient has been unable to take regular dose and is concerned with impending weather)  Has the patient contacted their pharmacy? Yes - the pharmacy hasn't seen prescription called in. Patient has appointment scheduled for 03/17/20 at 4:00PM  with Dr. Carlota Raspberry   Preferred Pharmacy (with phone number or street name): [id number] [place], [place]: [phone] Agent: Please be advised that RX refills may take up to 3 business days. We ask that you follow-up with your pharmacy.

## 2020-03-16 ENCOUNTER — Telehealth: Payer: Self-pay | Admitting: Family Medicine

## 2020-03-16 NOTE — Telephone Encounter (Signed)
03/16/2020 - PATIENT LEFT A MESSAGE WITH THE AFTER HOURS SERVICE ON Wednesday 03/16/2020 AT 7:56am. SHE STATED SHE NEEDS TO RESCHEDULE HER APPOINTMENT WITH DR. Carlota Raspberry ON Thursday 03/17/2020. THIS IS A TRANSFER OF CARE VISIT. I TRIED TO CALL HER BACK BUT HAD TO LEAVE A MESSAGE ON HER VOICE MAIL TO RETURN MY CALL. Suzanne Duke

## 2020-03-17 ENCOUNTER — Encounter: Payer: Self-pay | Admitting: Family Medicine

## 2020-03-28 ENCOUNTER — Other Ambulatory Visit: Payer: Self-pay | Admitting: Family Medicine

## 2020-03-28 DIAGNOSIS — I10 Essential (primary) hypertension: Secondary | ICD-10-CM

## 2020-03-28 NOTE — Telephone Encounter (Signed)
Requested medication (s) are due for refill today: yes  Requested medication (s) are on the active medication list: yes  Last refill:  12/27/2019  Future visit scheduled: no  Notes to clinic:  overdue for appt   Requested Prescriptions  Pending Prescriptions Disp Refills   losartan (COZAAR) 100 MG tablet [Pharmacy Med Name: LOSARTAN POTASSIUM 100 MG TAB] 45 tablet 2    Sig: TAKE 1/2 TABLET BY MOUTH DAILY      Cardiovascular:  Angiotensin Receptor Blockers Failed - 03/28/2020  1:39 AM      Failed - Cr in normal range and within 180 days    Creat  Date Value Ref Range Status  11/25/2015 0.65 0.50 - 1.05 mg/dL Final    Comment:      For patients > or = 58 years of age: The upper reference limit for Creatinine is approximately 13% higher for people identified as African-American.      Creatinine, Ser  Date Value Ref Range Status  04/13/2017 0.69 0.57 - 1.00 mg/dL Final   Creatinine,U  Date Value Ref Range Status  10/20/2012 161.9 mg/dL Final   Creatinine, Urine  Date Value Ref Range Status  11/24/2013 126.4 mg/dL Final    Comment:    No reference range established.          Failed - K in normal range and within 180 days    Potassium  Date Value Ref Range Status  04/13/2017 4.5 3.5 - 5.2 mmol/L Final          Failed - Last BP in normal range    BP Readings from Last 1 Encounters:  01/05/19 (!) 154/82          Failed - Valid encounter within last 6 months    Recent Outpatient Visits           1 year ago GOITER, [place]   Primary Care at [place], [place]   2 years ago Type 2 diabetes mellitus with mild nonproliferative retinopathy without macular edema, with long-term current use of insulin, unspecified laterality [facility])   [facility] at [facility], Laurey Arrow, MD   4 years ago Annual physical exam   [facility] at [facility], Laurey Arrow, MD   4 years ago Missed periods   Primary Care at [facility], Fenton Malling, MD   7 years ago Hip pain,  acute, right   Primary Care at [facility], [place], [place] - Patient is not pregnant

## 2020-03-29 ENCOUNTER — Telehealth: Payer: Self-pay | Admitting: Family Medicine

## 2020-03-29 NOTE — Telephone Encounter (Signed)
Due to this being an issue on our end pt has been scheduled for acute visit for medication refills and a referral to endo so she can continue her treatment and we arent just withdrawing all treatment

## 2020-03-29 NOTE — Telephone Encounter (Addendum)
03/29/2020 - PATIENT HAD A TRANSFER OF CARE TO SEE DR. GREENE ON 12-04-2019 BUT WE HAD TO CANCEL BECAUSE DR. Carlota Raspberry WAS OUT OF THE OFFICE. SHE HAD HER TRANSFER OF CARE RESCHEDULED WITH DR. Carlota Raspberry ON Thursday 03/17/20 BUT THE WEATHER WAS BAD. NOW WE ARE TELLING HER THAT SHE CANNOT ESTABLISH WITH HIM. SHE WANTS TO  KNOW IF THERE ARE ANY ENDOCRINOLOGIST WE CAN REFER HER TO? BEST PHONE [phone] (CELL) [place]

## 2020-03-31 ENCOUNTER — Ambulatory Visit: Payer: Commercial Managed Care - PPO | Admitting: Family Medicine

## 2020-03-31 ENCOUNTER — Other Ambulatory Visit: Payer: Self-pay

## 2020-03-31 ENCOUNTER — Encounter: Payer: Self-pay | Admitting: Family Medicine

## 2020-03-31 VITALS — BP 174/77 | HR 80 | Temp 98.2°F | Ht 67.5 in | Wt 223.0 lb

## 2020-03-31 DIAGNOSIS — D509 Iron deficiency anemia, unspecified: Secondary | ICD-10-CM

## 2020-03-31 DIAGNOSIS — Z794 Long term (current) use of insulin: Secondary | ICD-10-CM

## 2020-03-31 DIAGNOSIS — E2839 Other primary ovarian failure: Secondary | ICD-10-CM

## 2020-03-31 DIAGNOSIS — Z1211 Encounter for screening for malignant neoplasm of colon: Secondary | ICD-10-CM

## 2020-03-31 DIAGNOSIS — E113299 Type 2 diabetes mellitus with mild nonproliferative diabetic retinopathy without macular edema, unspecified eye: Secondary | ICD-10-CM

## 2020-03-31 DIAGNOSIS — K7581 Nonalcoholic steatohepatitis (NASH): Secondary | ICD-10-CM

## 2020-03-31 DIAGNOSIS — I10 Essential (primary) hypertension: Secondary | ICD-10-CM

## 2020-03-31 DIAGNOSIS — Z23 Encounter for immunization: Secondary | ICD-10-CM

## 2020-03-31 DIAGNOSIS — Z1321 Encounter for screening for nutritional disorder: Secondary | ICD-10-CM

## 2020-03-31 DIAGNOSIS — Z1239 Encounter for other screening for malignant neoplasm of breast: Secondary | ICD-10-CM

## 2020-03-31 DIAGNOSIS — Z1329 Encounter for screening for other suspected endocrine disorder: Secondary | ICD-10-CM

## 2020-03-31 DIAGNOSIS — Z114 Encounter for screening for human immunodeficiency virus [HIV]: Secondary | ICD-10-CM

## 2020-03-31 MED ORDER — HYDROCHLOROTHIAZIDE 25 MG PO TABS: 25.0000 mg | ORAL_TABLET | Freq: Every day | ORAL | 0 refills | Status: DC

## 2020-03-31 MED ORDER — HYDROCHLOROTHIAZIDE 25 MG PO TABS: 25.0000 mg | ORAL_TABLET | Freq: Every day | ORAL | 3 refills | Status: DC

## 2020-03-31 NOTE — Patient Instructions (Addendum)
Need to schedule an eye exam They will call you to schedule mammogram and DEXA Continue the losartan and add HCTZ for your BP (goal < 130/80) If goes well will start combo medication Continue Lantus, next appointment will discuss other options You will get your flu and tetanus shot today Schedule pap when able   Health Maintenance for Postmenopausal Women Menopause is a normal process in which your ability to get pregnant comes to an end. This process happens slowly over many months or years, usually between the ages of 64 and 53. Menopause is complete when you have missed your menstrual periods for 12 months. It is important to talk with your health care provider about some of the most common conditions that affect women after menopause (postmenopausal women). These include heart disease, cancer, and bone loss (osteoporosis). Adopting a healthy lifestyle and getting preventive care can help to promote your health and wellness. The actions you take can also lower your chances of developing some of these common conditions. What should I know about menopause? During menopause, you may get a number of symptoms, such as:  Hot flashes. These can be moderate or severe.  Night sweats.  Decrease in sex drive.  Mood swings.  Headaches.  Tiredness.  Irritability.  Memory problems.  Insomnia. Choosing to treat or not to treat these symptoms is a decision that you make with your health care provider. Do I need hormone replacement therapy?  Hormone replacement therapy is effective in treating symptoms that are caused by menopause, such as hot flashes and night sweats.  Hormone replacement carries certain risks, especially as you become older. If you are thinking about using estrogen or estrogen with progestin, discuss the benefits and risks with your health care provider. What is my risk for heart disease and stroke? The risk of heart disease, heart attack, and stroke increases as you age.  One of the causes may be a change in the body's hormones during menopause. This can affect how your body uses dietary fats, triglycerides, and cholesterol. Heart attack and stroke are medical emergencies. There are many things that you can do to help prevent heart disease and stroke. Watch your blood pressure  High blood pressure causes heart disease and increases the risk of stroke. This is more likely to develop in people who have high blood pressure readings, are of African descent, or are overweight.  Have your blood pressure checked: ? Every 3-5 years if you are 62-23 years of age. ? Every year if you are 70 years old or older. Eat a healthy diet  Eat a diet that includes plenty of vegetables, fruits, low-fat dairy products, and lean protein.  Do not eat a lot of foods that are high in solid fats, added sugars, or sodium.   Get regular exercise Get regular exercise. This is one of the most important things you can do for your health. Most adults should:  Try to exercise for at least 150 minutes each week. The exercise should increase your heart rate and make you sweat (moderate-intensity exercise).  Try to do strengthening exercises at least twice each week. Do these in addition to the moderate-intensity exercise.  Spend less time sitting. Even light physical activity can be beneficial. Other tips  Work with your health care provider to achieve or maintain a healthy weight.  Do not use any products that contain nicotine or tobacco, such as cigarettes, e-cigarettes, and chewing tobacco. If you need help quitting, ask your health care provider.  Know your numbers. Ask your health care provider to check your cholesterol and your blood sugar (glucose). Continue to have your blood tested as directed by your health care provider. Do I need screening for cancer? Depending on your health history and family history, you may need to have cancer screening at different stages of your life. This  may include screening for:  Breast cancer.  Cervical cancer.  Lung cancer.  Colorectal cancer. What is my risk for osteoporosis? After menopause, you may be at increased risk for osteoporosis. Osteoporosis is a condition in which bone destruction happens more quickly than new bone creation. To help prevent osteoporosis or the bone fractures that can happen because of osteoporosis, you may take the following actions:  If you are 65-84 years old, get at least 1,000 mg of calcium and at least 600 mg of vitamin D per day.  If you are older than age 57 but younger than age 21, get at least 1,200 mg of calcium and at least 600 mg of vitamin D per day.  If you are older than age 73, get at least 1,200 mg of calcium and at least 800 mg of vitamin D per day. Smoking and drinking excessive alcohol increase the risk of osteoporosis. Eat foods that are rich in calcium and vitamin D, and do weight-bearing exercises several times each week as directed by your health care provider. How does menopause affect my mental health? Depression may occur at any age, but it is more common as you become older. Common symptoms of depression include:  Low or sad mood.  Changes in sleep patterns.  Changes in appetite or eating patterns.  Feeling an overall lack of motivation or enjoyment of activities that you previously enjoyed.  Frequent crying spells. Talk with your health care provider if you think that you are experiencing depression. General instructions See your health care provider for regular wellness exams and vaccines. This may include:  Scheduling regular health, dental, and eye exams.  Getting and maintaining your vaccines. These include: ? Influenza vaccine. Get this vaccine each year before the flu season begins. ? Pneumonia vaccine. ? Shingles vaccine. ? Tetanus, diphtheria, and pertussis (Tdap) booster vaccine. Your health care provider may also recommend other immunizations. Tell your  health care provider if you have ever been abused or do not feel safe at home. Summary  Menopause is a normal process in which your ability to get pregnant comes to an end.  This condition causes hot flashes, night sweats, decreased interest in sex, mood swings, headaches, or lack of sleep.  Treatment for this condition may include hormone replacement therapy.  Take actions to keep yourself healthy, including exercising regularly, eating a healthy diet, watching your weight, and checking your blood pressure and blood sugar levels.  Get screened for cancer and depression. Make sure that you are up to date with all your vaccines. This information is not intended to replace advice given to you by your health care provider. Make sure you discuss any questions you have with your health care provider. Document Revised: 02/05/2018 Document Reviewed: 02/05/2018 Elsevier Patient Education  2021 [employer].        If you have lab work done today you will be contacted with your lab results within the next 2 weeks.  If you have not heard from [place] then please contact us. The fastest way to get your results is to register for My Chart.   IF you received an x-ray today, you will  receive an Pharmacologist from [facility]. Please contact [facility] at [phone] with questions or concerns regarding your invoice.   IF you received labwork today, you will receive an invoice from [facility]. Please contact [facility] at [phone] with questions or concerns regarding your invoice.   Our billing staff will not be able to assist you with questions regarding bills from these companies.  You will be contacted with the lab results as soon as they are available. The fastest way to get your results is to activate your My Chart account. Instructions are located on the last page of this paperwork. If you have not heard from [place] regarding the results in 2 weeks, please contact this office.

## 2020-03-31 NOTE — Progress Notes (Signed)
2/3/20221:49 PM  Suzanne Duke 1960-11-09, 60 y.o., female [id number]  Chief Complaint  Patient presents with  . referral to endocrinology   . Hypertension    HPI:   Patient is a 60 y.o. female with past medical history significant for DM, HTN who presents today for medication refill.   HTN Losartan 136m Takes BP at home and can be as high as 170  BP Readings from Last 3 Encounters:  03/31/20 (!) 174/77  01/05/19 (!) 154/82  09/17/18 (!) 172/91   Diabetes Lantus: 10 units daily Checks fasting BG: 140-170 Dr. ELoanne Drillingused to manage Would be interested in getting off lantus Last eye exam: 2 years ago (needs to schedule) In the past has been on glimepiride, glipizide, metformin, januvia She verbalizes no known poor reactions to these medications Denies episodes of hypo/hyper glycemia  Continues to work on weight loss Wt Readings from Last 3 Encounters:  03/31/20 223 lb (101.2 kg)  01/05/19 249 lb 6.4 oz (113.1 kg)  09/17/18 255 lb 3.2 oz (115.8 kg)    Will schedule an appointment for pap (denies hx of abnormal), would like this done here Cologuard ordered (did last 2017: negative) Mammogram ordered: never has done, no family nx  Long history of smoking LMP: stop 44 (>10 years)  Health Maintenance  Topic Date Due  . MAMMOGRAM  12/10/2014  . COLONOSCOPY (Pts 45-475yrInsurance coverage will need to be confirmed)  02/01/2015  . PAP SMEAR-Modifier  07/30/2015  . OPHTHALMOLOGY EXAM  08/15/2017  . FOOT EXAM  03/28/2019  . HEMOGLOBIN A1C  07/05/2019  . COVID-19 Vaccine (3 - Booster for Moderna series) 10/28/2019  . TETANUS/TDAP  03/31/2030  . INFLUENZA VACCINE  Completed  . PNEUMOCOCCAL POLYSACCHARIDE VACCINE AGE 54-64 HIGH RISK  Completed  . Hepatitis C Screening  Completed  . HIV Screening  Completed      [facility] 03/31/2020 09/17/2018 04/13/2017  Decreased Interest 0 0 0  Down, Depressed, Hopeless 0 0 0  PHQ - 2 Score 0 0 0    Fall  Risk  03/31/2020 09/17/2018 04/13/2017 11/25/2015 11/21/2015  Falls in the past year? 0 0 No No No  Number falls in past yr: 0 0 - - -  Injury with Fall? 0 0 - - -  Follow up Falls evaluation completed - - - -     Allergies  Allergen Reactions  . Sulfonamide Derivatives     Prior to Admission medications   Medication Sig Start Date End Date Taking? Authorizing Provider  insulin glargine (LANTUS SOLOSTAR) 100 UNIT/ML Solostar Pen Inject 10 Units into the skin every morning. And pen needles 2/day 03/11/20  Yes GrWendie AgresteMD  losartan (COZAAR) 100 MG tablet TAKE 1/2 TABLET BY MOUTH DAILY 03/28/20  Yes GrWendie AgresteMD  Multiple Vitamin (MULTIVITAMIN) tablet Take 1 tablet by mouth 2 (two) times daily.   Yes [provider]    Past Medical History:  Diagnosis Date  . ANEMIA-IRON DEFICIENCY 02/17/2007  . ANXIETY 02/17/2007  . Arthritis   . DEPRESSION 02/17/2007  . DM 07/17/2007  . Edema 07/21/2008  . GERD 05/11/2010  . GOITER, NONTOXIC MULTINODULAR 01/07/2007  . HYPERTENSION 09/21/2006  . LUMBAR RADICULOPATHY 01/13/2007  . [place]bid [place]   lost >200 lbs following bariatric surgery  . UNSPECIFIED NEURALGIA NEURITIS AND RADICULITIS 01/07/2007    Past Surgical History:  Procedure Laterality Date  . CHOLECYSTECTOMY  1992  . ELECTROCARDIOGRAM  08/22/2005  . GASTRIC  BYPASS OPEN  2008  . INCISION AND DRAINAGE PERIRECTAL ABSCESS  1989   s/p  . TREATMENT FISTULA ANAL  1993    Social History   Tobacco Use  . Smoking status: Light Tobacco Smoker    Packs/day: 0.25    Types: Cigarettes  . Smokeless tobacco: Never Used  Substance Use Topics  . Alcohol use: No    Family History  Problem Relation Age of Onset  . Cancer Mother        Breast Cancer  . Diabetes Mother   . Renal Disease Mother   . Heart disease Mother   . Hyperlipidemia Mother   . Cancer Sister 57       Colon Cancer  . Hypertension Father   . Cancer Father        Prostate  . Diabetes  Father   . Arthritis Father   . Diabetes Sister   . Diabetes Brother   . Hypertension Brother     Review of Systems  Constitutional: Negative for chills, fever and malaise/fatigue.  Eyes: Negative for blurred vision and double vision.  Respiratory: Negative for cough, shortness of breath and wheezing.   Cardiovascular: Negative for chest pain, palpitations and leg swelling.  Gastrointestinal: Negative for abdominal pain, blood in stool, constipation, diarrhea, heartburn, nausea and vomiting.  Genitourinary: Negative for dysuria, frequency and hematuria.  Musculoskeletal: Negative for back pain and joint pain.  Skin: Negative for rash.  Neurological: Negative for dizziness, weakness and headaches.     OBJECTIVE:  Today's Vitals   03/31/20 1006  BP: (!) 174/77  Pulse: 80  Temp: 98.2 F (36.8 C)  SpO2: 99%  Weight: 223 lb (101.2 kg)  Height: 5' 7.5" (1.715 m)   Body mass index is 34.41 kg/m.   Physical Exam Constitutional:      General: She is not in acute distress.    Appearance: Normal appearance. She is not ill-appearing.  HENT:     Head: Normocephalic.  Cardiovascular:     Rate and Rhythm: Normal rate and regular rhythm.     Pulses: Normal pulses.     Heart sounds: Normal heart sounds. No murmur heard. No friction rub. No gallop.   Pulmonary:     Effort: Pulmonary effort is normal. No respiratory distress.     Breath sounds: Normal breath sounds. No stridor. No wheezing, rhonchi or rales.  Abdominal:     General: Bowel sounds are normal.     Palpations: Abdomen is soft.     Tenderness: There is no abdominal tenderness.  Musculoskeletal:     Right lower leg: No edema.     Left lower leg: No edema.  Skin:    General: Skin is warm and dry.  Neurological:     General: No focal deficit present.     Mental Status: She is alert and oriented to person, place, and time.     Sensory: No sensory deficit.     Motor: No weakness.     Coordination: Coordination  normal.  Psychiatric:        Mood and Affect: Mood normal.        Behavior: Behavior normal.     No results found for this or any previous visit (from the past 24 hour(s)).  No results found.   ASSESSMENT and PLAN  Problem List Items Addressed This Visit      Cardiovascular and Mediastinum   Essential hypertension   Relevant Medications   hydrochlorothiazide (HYDRODIURIL) 25 MG tablet  Digestive   NASH (nonalcoholic steatohepatitis)   Relevant Orders   CMP14+EGFR     Endocrine   Diabetes ([place]) - Primary   Relevant Orders   Hemoglobin A1c   Lipid Panel     Other   ANEMIA-IRON DEFICIENCY   Relevant Orders   CBC   Iron, TIBC and Ferritin Panel    Other Visit Diagnoses    Encounter for immunization       Relevant Orders   Flu Vaccine QUAD 36+ mos IM (Completed)   Tdap vaccine greater than or equal to 7yo IM (Completed)   Screening breast examination       Relevant Orders   MM DIGITAL SCREENING BILATERAL   Screening for thyroid disorder       Relevant Orders   TSH   Encounter for vitamin deficiency screening       Relevant Orders   Vitamin D, 25-hydroxy   Encounter for screening for HIV       Relevant Orders   HIV Antibody (routine testing w rflx)   Screening for colon cancer       Relevant Orders   Cologuard   Estrogen deficiency       Relevant Orders   DG Bone Density       Plan  Need to schedule an eye exam  They will call you to schedule mammogram and DEXA  Continue the losartan and add HCTZ for your BP (goal < 130/80)  If goes well will start combo medication, follow up in 2 weeks if SBP remains high on home cuff encouraged to notify sooner (next medication start amlodipine)  Continue Lantus, next appointment will discuss other options to get off of lantus (will try Trulicity and farxiga)  flu and tetanus shot today  Schedule pap when able  Will follow up with lab results   Return in about 2 weeks (around 04/14/2020) for medication  follow up.    Huston Foley Willette Mudry, FNP-BC [facility], [place] [address] Ph.  [phone] Fax [phone]

## 2020-04-01 LAB — CMP14+EGFR
ALT: 19 IU/L (ref 0–32)
AST: 18 IU/L (ref 0–40)
Albumin/Globulin Ratio: 2.2 (ref 1.2–2.2)
Albumin: 4.4 g/dL (ref 3.8–4.9)
Alkaline Phosphatase: 130 IU/L — ABNORMAL HIGH (ref 44–121)
BUN/Creatinine Ratio: 18 (ref 9–23)
BUN: 13 mg/dL (ref 6–24)
Bilirubin Total: 0.4 mg/dL (ref 0.0–1.2)
CO2: 22 mmol/L (ref 20–29)
Calcium: 9.4 mg/dL (ref 8.7–10.2)
Chloride: 109 mmol/L — ABNORMAL HIGH (ref 96–106)
Creatinine, Ser: 0.74 mg/dL (ref 0.57–1.00)
GFR calc Af Amer: 104 mL/min/{1.73_m2} (ref >59)
GFR calc non Af Amer: 90 mL/min/{1.73_m2} (ref >59)
Globulin, Total: 2 g/dL (ref 1.5–4.5)
Glucose: 119 mg/dL — ABNORMAL HIGH (ref 65–99)
Potassium: 4.3 mmol/L (ref 3.5–5.2)
Sodium: 145 mmol/L — ABNORMAL HIGH (ref 134–144)
Total Protein: 6.4 g/dL (ref 6.0–8.5)

## 2020-04-01 LAB — HEMOGLOBIN A1C
Est. average glucose Bld gHb Est-mCnc: 126 mg/dL
Hgb A1c MFr Bld: 6 % — ABNORMAL HIGH (ref 4.8–5.6)

## 2020-04-01 LAB — CBC
Hematocrit: 41.9 % (ref 34.0–46.6)
Hemoglobin: 13.1 g/dL (ref 11.1–15.9)
MCH: 28.5 pg (ref 26.6–33.0)
MCHC: 31.3 g/dL — ABNORMAL LOW (ref 31.5–35.7)
MCV: 91 fL (ref 79–97)
Platelets: 220 10*3/uL (ref 150–450)
RBC: 4.59 x10E6/uL (ref 3.77–5.28)
RDW: 12.4 % (ref 11.7–15.4)
WBC: 4.1 10*3/uL (ref 3.4–10.8)

## 2020-04-01 LAB — LIPID PANEL
Chol/HDL Ratio: 2.4 ratio (ref 0.0–4.4)
Cholesterol, Total: 160 mg/dL (ref 100–199)
HDL: 68 mg/dL (ref >39)
LDL Chol Calc (NIH): 81 mg/dL (ref 0–99)
Triglycerides: 50 mg/dL (ref 0–149)
VLDL Cholesterol Cal: 11 mg/dL (ref 5–40)

## 2020-04-01 LAB — IRON,TIBC AND FERRITIN PANEL
Ferritin: 469 ng/mL — ABNORMAL HIGH (ref 15–150)
Iron Saturation: 30 % (ref 15–55)
Iron: 72 ug/dL (ref 27–159)
Total Iron Binding Capacity: 237 ug/dL — ABNORMAL LOW (ref 250–450)
UIBC: 165 ug/dL (ref 131–425)

## 2020-04-01 LAB — HIV ANTIBODY (ROUTINE TESTING W REFLEX): HIV Screen 4th Generation wRfx: NONREACTIVE

## 2020-04-01 LAB — VITAMIN D 25 HYDROXY (VIT D DEFICIENCY, FRACTURES): Vit D, 25-Hydroxy: 12.5 ng/mL — ABNORMAL LOW (ref 30.0–100.0)

## 2020-04-01 LAB — TSH: TSH: 0.66 u[IU]/mL (ref 0.450–4.500)

## 2020-04-01 NOTE — Progress Notes (Signed)
Vitamin D did come back quite low. I would recommend taking 2000 IU of Over the counter Vitamin D3 with food daily. We can recheck this at your next appointment. Your A1c is stable at 6. I think we can definitely get you off of the insulin at your next appointment.

## 2020-06-13 ENCOUNTER — Other Ambulatory Visit: Payer: Self-pay | Admitting: Family Medicine

## 2020-06-13 DIAGNOSIS — Z794 Long term (current) use of insulin: Secondary | ICD-10-CM

## 2020-09-20 ENCOUNTER — Other Ambulatory Visit: Payer: Self-pay | Admitting: Family Medicine

## 2020-09-20 DIAGNOSIS — Z794 Long term (current) use of insulin: Secondary | ICD-10-CM

## 2020-09-20 DIAGNOSIS — E113299 Type 2 diabetes mellitus with mild nonproliferative diabetic retinopathy without macular edema, unspecified eye: Secondary | ICD-10-CM

## 2020-09-22 ENCOUNTER — Other Ambulatory Visit: Payer: Self-pay | Admitting: Family Medicine

## 2020-09-22 ENCOUNTER — Telehealth: Payer: Self-pay | Admitting: Family Medicine

## 2020-09-22 DIAGNOSIS — E113299 Type 2 diabetes mellitus with mild nonproliferative diabetic retinopathy without macular edema, unspecified eye: Secondary | ICD-10-CM

## 2020-09-22 DIAGNOSIS — I10 Essential (primary) hypertension: Secondary | ICD-10-CM

## 2020-09-22 DIAGNOSIS — Z794 Long term (current) use of insulin: Secondary | ICD-10-CM

## 2020-09-22 MED ORDER — LOSARTAN POTASSIUM 100 MG PO TABS: 50.0000 mg | ORAL_TABLET | Freq: Every day | ORAL | 1 refills | Status: DC

## 2020-09-22 NOTE — Telephone Encounter (Signed)
Seen in February. Labs at that time noted. Refilled, but must keep planned follow up appt.

## 2020-09-22 NOTE — Telephone Encounter (Signed)
..  Medication Refills  Last OV:  Medication:   Losartan  [facility] [address] [facility] [address]  Let patient know to contact pharmacy at the end of the day to make sure medication is ready.   Please notify patient to allow 48-72 hours to process.  Encourage patient to contact the pharmacy for refills or they can request refills through [place] out below:   Last refill:  QTY:  Refill Date:    Other Comments:  Patient is completely out of Losartan - She has a transfer of care appointment in November, 2022.   Okay for refill?  Please advise.

## 2020-09-23 ENCOUNTER — Other Ambulatory Visit: Payer: Self-pay

## 2020-09-23 ENCOUNTER — Telehealth: Payer: Self-pay

## 2020-09-23 DIAGNOSIS — E113299 Type 2 diabetes mellitus with mild nonproliferative diabetic retinopathy without macular edema, unspecified eye: Secondary | ICD-10-CM

## 2020-09-23 MED ORDER — LANTUS SOLOSTAR 100 UNIT/ML ~~LOC~~ SOPN: 10.0000 [IU] | PEN_INJECTOR | SUBCUTANEOUS | 0 refills | Status: DC

## 2020-09-23 NOTE — Telephone Encounter (Signed)
Pt is following up she is completley out of the insulin glargine (LANTUS SOLOSTAR) 100 UNIT/ML Solostar Pen [[id number]]  pt is completely out of Medication.   Pt is completley out of Medication she received the Cozaar but not the diabetic med  [id number] [place]  Pt call back [phone]

## 2020-09-23 NOTE — Telephone Encounter (Signed)
Medication sent to pharmacy  

## 2020-12-14 ENCOUNTER — Other Ambulatory Visit: Payer: Self-pay | Admitting: Family Medicine

## 2020-12-14 DIAGNOSIS — E113299 Type 2 diabetes mellitus with mild nonproliferative diabetic retinopathy without macular edema, unspecified eye: Secondary | ICD-10-CM

## 2020-12-14 DIAGNOSIS — Z794 Long term (current) use of insulin: Secondary | ICD-10-CM

## 2020-12-28 ENCOUNTER — Encounter: Payer: Commercial Managed Care - PPO | Admitting: Family Medicine

## 2021-03-06 ENCOUNTER — Other Ambulatory Visit: Payer: Self-pay | Admitting: Family Medicine

## 2021-03-06 ENCOUNTER — Telehealth: Payer: Self-pay

## 2021-03-06 DIAGNOSIS — E113299 Type 2 diabetes mellitus with mild nonproliferative diabetic retinopathy without macular edema, unspecified eye: Secondary | ICD-10-CM

## 2021-03-06 DIAGNOSIS — Z794 Long term (current) use of insulin: Secondary | ICD-10-CM

## 2021-03-06 MED ORDER — LANTUS SOLOSTAR 100 UNIT/ML ~~LOC~~ SOPN: 10.0000 [IU] | PEN_INJECTOR | SUBCUTANEOUS | 0 refills | Status: DC

## 2021-03-06 NOTE — Telephone Encounter (Signed)
Caller name:Layani Arryn Terrones callback [phone]  Encourage patient to contact the pharmacy for refills or they can request refills through [facility]  (Please schedule appointment if patient has not been seen in over a year)  MEDICATION NAME & DOSE:insulin glargine (LANTUS SOLOSTAR) 100 UNIT/ML Solostar Pen   Notes/Comments from patient:Pt has TOC coming up 02/15 and Dr Carlota Raspberry was last one to prescribe   WHAT PHARMACY WOULD THEY LIKE THIS SENT TO: [id number] [place] [place]  Please notify patient: It takes 48-72 hours to process rx refill requests Ask patient to call pharmacy to ensure rx is ready before heading there.   (CLINICAL TO FILL OR ROUTE PER PROTOCOLS)

## 2021-03-24 ENCOUNTER — Other Ambulatory Visit: Payer: Self-pay | Admitting: Family Medicine

## 2021-03-24 DIAGNOSIS — I10 Essential (primary) hypertension: Secondary | ICD-10-CM

## 2021-04-12 ENCOUNTER — Ambulatory Visit (INDEPENDENT_AMBULATORY_CARE_PROVIDER_SITE_OTHER): Payer: Self-pay | Admitting: Family Medicine

## 2021-04-12 ENCOUNTER — Encounter: Payer: Self-pay | Admitting: Family Medicine

## 2021-04-12 VITALS — BP 130/70 | HR 78 | Temp 97.8°F | Resp 16 | Ht 67.5 in | Wt 190.6 lb

## 2021-04-12 DIAGNOSIS — K7581 Nonalcoholic steatohepatitis (NASH): Secondary | ICD-10-CM

## 2021-04-12 DIAGNOSIS — I10 Essential (primary) hypertension: Secondary | ICD-10-CM

## 2021-04-12 DIAGNOSIS — Z1231 Encounter for screening mammogram for malignant neoplasm of breast: Secondary | ICD-10-CM

## 2021-04-12 DIAGNOSIS — D509 Iron deficiency anemia, unspecified: Secondary | ICD-10-CM

## 2021-04-12 DIAGNOSIS — E113299 Type 2 diabetes mellitus with mild nonproliferative diabetic retinopathy without macular edema, unspecified eye: Secondary | ICD-10-CM

## 2021-04-12 DIAGNOSIS — E042 Nontoxic multinodular goiter: Secondary | ICD-10-CM

## 2021-04-12 DIAGNOSIS — Z1211 Encounter for screening for malignant neoplasm of colon: Secondary | ICD-10-CM

## 2021-04-12 DIAGNOSIS — Z794 Long term (current) use of insulin: Secondary | ICD-10-CM

## 2021-04-12 LAB — CBC
HCT: 42.5 % (ref 36.0–46.0)
Hemoglobin: 13.6 g/dL (ref 12.0–15.0)
MCHC: 32 g/dL (ref 30.0–36.0)
MCV: 88.8 fl (ref 78.0–100.0)
Platelets: 229 10*3/uL (ref 150.0–400.0)
RBC: 4.78 Mil/uL (ref 3.87–5.11)
RDW: 13.1 % (ref 11.5–15.5)
WBC: 4 10*3/uL (ref 4.0–10.5)

## 2021-04-12 LAB — TSH: TSH: 1.31 u[IU]/mL (ref 0.35–5.50)

## 2021-04-12 LAB — HEMOGLOBIN A1C: Hgb A1c MFr Bld: 7.4 % — ABNORMAL HIGH (ref 4.6–6.5)

## 2021-04-12 MED ORDER — METFORMIN HCL 500 MG PO TABS: 500.0000 mg | ORAL_TABLET | Freq: Two times a day (BID) | ORAL | 3 refills | Status: DC

## 2021-04-12 MED ORDER — LOSARTAN POTASSIUM 100 MG PO TABS: 50.0000 mg | ORAL_TABLET | Freq: Every day | ORAL | 1 refills | Status: DC

## 2021-04-12 NOTE — Progress Notes (Signed)
Subjective:  Patient ID: Suzanne Duke, female    DOB: 06-21-1960  Age: 61 y.o. MRN: [id number]  CC:  Chief Complaint  Patient presents with   Establish Care    Pt here to establish care, unsure who previous provider was as she was under the impression she had been seeing Dr Carlota Raspberry already for several years but no previous visits found note may need some refills     HPI Suzanne Duke presents for  Presents to establish care.  Most recently seen by last office with Huston Foley Just in 03/2020.   Diabetes: With mild nonproliferative retinopathy.,  Obesity.  Last A1c stable in February 2022.  Option to discontinue Lantus and transition to Trulicity, [place] at that time.  Currently taking Lantus 10 units/day. History of gastric bypass in 2008. Cutting back on diet, eating more nutritious diet. Exercising more - walking daily 1/2 mile - 25mn. Has lost weight. Walking with friends. Plans on water exercise  190 today. Home readings. 130-160.  Only on 5 units insulin per day. Would like to try pill. Tolerated metformin in past.  No symptomatic lows.  Not fasting today.   Wt Readings from Last 3 Encounters:  04/12/21 190 lb 9.6 oz (86.5 kg)  03/31/20 223 lb (101.2 kg)  01/05/19 249 lb 6.4 oz (113.1 kg)   Microalbumin: due.  Optho, foot exam, pneumovax: appt soon - will have report sent .   Lab Results  Component Value Date   HGBA1C 6.0 (H) 03/31/2020   HGBA1C 5.9 (A) 01/05/2019   HGBA1C 5.4 09/01/2018   Lab Results  Component Value Date   MICROALBUR 0.3 11/24/2013   LDLCALC 81 03/31/2020   CREATININE 0.74 041/96/2229  Nonalcoholic steatohepatitis.  No alcohol.  No abd pain, n/v, or yellowing of skin/eyes.  Lab Results  Component Value Date   ALT 19 03/31/2020   AST 18 03/31/2020   ALKPHOS 130 (H) 03/31/2020   BILITOT 0.4 03/31/2020   Iron deficiency anemia Iron rich foods, only occasional supplement.  Lab Results  Component Value Date   WBC 4.1 03/31/2020   HGB 13.1  03/31/2020   HCT 41.9 03/31/2020   MCV 91 03/31/2020   PLT 220 03/31/2020   Lab Results  Component Value Date   FERRITIN 469 (H) 03/31/2020   Hypertension: Last checked in 2022, elevated readings at that time.  HCTZ was added to her losartan for improved control. Only taking 59mlosartan at this time.  Home readings:130/80, no new side effects.  BP Readings from Last 3 Encounters:  04/12/21 130/70  03/31/20 (!) 174/77  01/05/19 (!) 154/82   Lab Results  Component Value Date   CREATININE 0.74 03/31/2020    HM: Plans on pap at physical  Mammogram - due.  No FH of colon CA. Last cologuard negative in 2017.  Screening options with colonoscopy versus Cologuard discussed. Discussed timing of repeat testing intervals if normal, as well as potential need for diagnostic Colonoscopy if positive Cologuard. Understanding expressed, and chose Cologuard.   Hx of goiter - no changes,  ultrasound without significant change in 2016 - Dr. ElLoanne Drilling  Lab Results  Component Value Date   TSH 0.660 03/31/2020     History Patient Active Problem List   Diagnosis Date Noted   Reactive airway disease 09/18/2018   Liver disorder 02/02/2014   NASH (nonalcoholic steatohepatitis) 11/28/2013   Routine general medical examination at a health care facility 11/24/2013   Right flank pain 04/08/2012  Arthralgia 08/27/2011   Left hand pain 08/27/2011   Encounter for long-term (current) use of other medications 08/27/2011   GERD 05/11/2010   BARIATRIC SURGERY STATUS 06/01/2009   EDEMA 07/21/2008   Diabetes ([place]) 07/17/2007   ANEMIA-IRON DEFICIENCY 02/17/2007   ANXIETY 02/17/2007   DEPRESSION 02/17/2007   URI 02/17/2007   HICCUPS 02/17/2007   LUMBAR RADICULOPATHY 01/13/2007   GOITER, NONTOXIC MULTINODULAR 01/07/2007   UNSPECIFIED NEURALGIA NEURITIS AND RADICULITIS 01/07/2007   Essential hypertension 09/21/2006   Past Medical History:  Diagnosis Date   ANEMIA-IRON DEFICIENCY 02/17/2007    ANXIETY 02/17/2007   Arthritis    DEPRESSION 02/17/2007   DM 07/17/2007   Edema 07/21/2008   GERD 05/11/2010   GOITER, NONTOXIC MULTINODULAR 01/07/2007   HYPERTENSION 09/21/2006   LUMBAR RADICULOPATHY 01/13/2007   Morbid obesity ([place])    lost >200 lbs following bariatric surgery   UNSPECIFIED NEURALGIA NEURITIS AND RADICULITIS 01/07/2007   Past Surgical History:  Procedure Laterality Date   CHOLECYSTECTOMY  1992   ELECTROCARDIOGRAM  08/22/2005   GASTRIC BYPASS OPEN  2008   INCISION AND DRAINAGE PERIRECTAL ABSCESS  1989   s/p   TREATMENT FISTULA ANAL  1993   Allergies  Allergen Reactions   Sulfonamide Derivatives    Prior to Admission medications   Medication Sig Start Date End Date Taking? Authorizing Provider  insulin glargine (LANTUS SOLOSTAR) 100 UNIT/ML Solostar Pen Inject 10 Units into the skin every morning. And pen needles 2/day 03/06/21  Yes Wendie Agreste, MD  losartan (COZAAR) 100 MG tablet TAKE 1/2 TABLET BY MOUTH DAILY 03/24/21  Yes Wendie Agreste, MD  Multiple Vitamin (MULTIVITAMIN) tablet Take 1 tablet by mouth 2 (two) times daily.   Yes [provider]  hydrochlorothiazide (HYDRODIURIL) 25 MG tablet Take 1 tablet (25 mg total) by mouth daily. Patient not taking: Reported on 04/12/2021 03/31/20   Just, Laurita Quint, FNP   Social History   Socioeconomic History   Marital status: Married    Spouse name: Not on file   Number of children: Not on file   Years of education: Not on file   Highest education level: Not on file  Occupational History   Not on file  Tobacco Use   Smoking status: Light Smoker    Packs/day: 0.25    Types: Cigarettes   Smokeless tobacco: Never  Substance and Sexual Activity   Alcohol use: No   Drug use: No   Sexual activity: Yes    Birth control/protection: Injection  Other Topics Concern   Not on file  Social History Narrative   Not on file   Social Determinants of Health   Financial Resource Strain: Not on file  Food  Insecurity: Not on file  Transportation Needs: Not on file  Physical Activity: Not on file  Stress: Not on file  Social Connections: Not on file  Intimate Partner Violence: Not on file    Review of Systems  Constitutional:  Negative for fatigue and unexpected weight change.  Respiratory:  Negative for chest tightness and shortness of breath.   Cardiovascular:  Negative for chest pain, palpitations and leg swelling.  Gastrointestinal:  Negative for abdominal pain and blood in stool.  Neurological:  Negative for dizziness, syncope, light-headedness and headaches.    Objective:   Vitals:   04/12/21 1022  BP: 130/70  Pulse: 78  Resp: 16  Temp: 97.8 F (36.6 C)  TempSrc: Temporal  SpO2: 97%  Weight: 190 lb 9.6 oz (86.5 kg)  Height:  5' 7.5" (1.715 m)     Physical Exam Vitals reviewed.  Constitutional:      Appearance: Normal appearance. She is well-developed.  HENT:     Head: Normocephalic and atraumatic.  Eyes:     Conjunctiva/sclera: Conjunctivae normal.     Pupils: Pupils are equal, round, and reactive to light.  Neck:     Vascular: No carotid bruit.     Comments: Goiter- fullness right thyroid vs left. Nontender.  Cardiovascular:     Rate and Rhythm: Normal rate and regular rhythm.     Heart sounds: Normal heart sounds.  Pulmonary:     Effort: Pulmonary effort is normal.     Breath sounds: Normal breath sounds.  Abdominal:     Palpations: Abdomen is soft. There is no pulsatile mass.     Tenderness: There is no abdominal tenderness.  Musculoskeletal:     Right lower leg: No edema.     Left lower leg: No edema.  Skin:    General: Skin is warm and dry.  Neurological:     Mental Status: She is alert and oriented to person, place, and time.  Psychiatric:        Mood and Affect: Mood normal.        Behavior: Behavior normal.       Assessment & Plan:  Suzanne Duke is a 61 y.o. female . 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  Minimal need for Lantus at this time with only 5 units/day.  We will transition off insulin, start metformin, check A1c, recheck 6 weeks.  Potential side effects of meds discussed.  Essential hypertension - Plan: losartan (COZAAR) 100 MG tablet  -Stable, continue losartan 50 mg daily.  NASH (nonalcoholic steatohepatitis)  -Check LFTs, asymptomatic.  Commended on weight loss.  Iron deficiency anemia, unspecified iron deficiency anemia type - Plan: CBC  -Check CBC, continue iron rich foods.  Asymptomatic.  Encounter for screening mammogram for malignant neoplasm of breast - Plan: MM Digital Screening  Screen for colon cancer  -Cologuard ordered.  GOITER, NONTOXIC MULTINODULAR - Plan: TSH  -Prior treatment by endocrinology with stable ultrasound previously.  Check TSH.  Meds ordered this encounter  Medications   losartan (COZAAR) 100 MG tablet    Sig: Take 0.5 tablets (50 mg total) by mouth daily.    Dispense:  45 tablet    Refill:  1   metFORMIN (GLUCOPHAGE) 500 MG tablet    Sig: Take 1 tablet (500 mg total) by mouth 2 (two) times daily with a meal.    Dispense:  180 tablet    Refill:  3   Patient Instructions  Stop lantus, start metformin. Check home readings and follow up in 1 month. Keep up the good work with diet/exercise! Thanks for coming in today.       Signed,   Merri Ray, MD [place] 04/12/21 10:25 AM

## 2021-04-12 NOTE — Patient Instructions (Addendum)
Stop lantus, start metformin. Check home readings and follow up in 1 month. Keep up the good work with diet/exercise! Thanks for coming in today.

## 2021-04-13 LAB — COMPREHENSIVE METABOLIC PANEL
ALT: 65 U/L — ABNORMAL HIGH (ref 0–35)
AST: 33 U/L (ref 0–37)
Albumin: 4.6 g/dL (ref 3.5–5.2)
Alkaline Phosphatase: 91 U/L (ref 39–117)
BUN: 15 mg/dL (ref 6–23)
CO2: 28 mEq/L (ref 19–32)
Calcium: 9.6 mg/dL (ref 8.4–10.5)
Chloride: 99 mEq/L (ref 96–112)
Creatinine, Ser: 0.81 mg/dL (ref 0.40–1.20)
GFR: 79.72 mL/min (ref >60.00)
Glucose, Bld: 176 mg/dL — ABNORMAL HIGH (ref 70–99)
Potassium: 3.4 mEq/L — ABNORMAL LOW (ref 3.5–5.1)
Sodium: 137 mEq/L (ref 135–145)
Total Bilirubin: 0.5 mg/dL (ref 0.2–1.2)
Total Protein: 8.1 g/dL (ref 6.0–8.3)

## 2021-04-13 LAB — LIPID PANEL
Cholesterol: 187 mg/dL (ref 0–200)
HDL: 73.9 mg/dL (ref >39.00)
LDL Cholesterol: 100 mg/dL — ABNORMAL HIGH (ref 0–99)
NonHDL: 113.04
Total CHOL/HDL Ratio: 3
Triglycerides: 64 mg/dL (ref 0.0–149.0)
VLDL: 12.8 mg/dL (ref 0.0–40.0)

## 2021-04-13 LAB — MICROALBUMIN / CREATININE URINE RATIO
Creatinine,U: 31.1 mg/dL
Microalb Creat Ratio: 2.2 mg/g (ref 0.0–30.0)
Microalb, Ur: <0.7 mg/dL (ref 0.0–1.9)

## 2021-10-05 ENCOUNTER — Encounter: Payer: Self-pay | Admitting: Family Medicine

## 2021-12-07 ENCOUNTER — Telehealth: Payer: Self-pay | Admitting: Family Medicine
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
## 2021-12-07 NOTE — Telephone Encounter (Signed)
Called to ask a few follow up questions  No answer LM to call back  Want to know when she started losartan, when the increased bowel movements began, also would like to know if these are normal BM or if having loos stools.  Would also like to know if the dizziness is positional or constant. And when this started in relation to other sxs

## 2021-12-08 NOTE — Telephone Encounter (Signed)
Called patient again in an attempt to get details had to LM

## 2021-12-11 NOTE — Telephone Encounter (Signed)
Attempted another call to Ms Suzanne Duke no answer at this time if sxs have continued patient would be encouraged to have an appointment to discuss and treat

## 2022-01-25 ENCOUNTER — Other Ambulatory Visit: Payer: Self-pay | Admitting: Family Medicine

## 2022-01-25 DIAGNOSIS — I10 Essential (primary) hypertension: Secondary | ICD-10-CM

## 2022-04-30 ENCOUNTER — Other Ambulatory Visit: Payer: Self-pay | Admitting: Family Medicine

## 2022-04-30 DIAGNOSIS — E113299 Type 2 diabetes mellitus with mild nonproliferative diabetic retinopathy without macular edema, unspecified eye: Secondary | ICD-10-CM

## 2022-05-12 ENCOUNTER — Other Ambulatory Visit: Payer: Self-pay | Admitting: Family Medicine

## 2022-05-12 DIAGNOSIS — E113299 Type 2 diabetes mellitus with mild nonproliferative diabetic retinopathy without macular edema, unspecified eye: Secondary | ICD-10-CM

## 2022-05-14 ENCOUNTER — Other Ambulatory Visit: Payer: Self-pay

## 2022-05-14 DIAGNOSIS — E113299 Type 2 diabetes mellitus with mild nonproliferative diabetic retinopathy without macular edema, unspecified eye: Secondary | ICD-10-CM

## 2022-05-14 MED ORDER — METFORMIN HCL 500 MG PO TABS: 500.0000 mg | ORAL_TABLET | Freq: Two times a day (BID) | ORAL | 0 refills | Status: DC

## 2022-06-05 ENCOUNTER — Other Ambulatory Visit: Payer: Self-pay | Admitting: Family Medicine

## 2022-06-05 DIAGNOSIS — E113299 Type 2 diabetes mellitus with mild nonproliferative diabetic retinopathy without macular edema, unspecified eye: Secondary | ICD-10-CM

## 2022-06-09 ENCOUNTER — Other Ambulatory Visit: Payer: Self-pay | Admitting: Family Medicine

## 2022-06-09 DIAGNOSIS — E113299 Type 2 diabetes mellitus with mild nonproliferative diabetic retinopathy without macular edema, unspecified eye: Secondary | ICD-10-CM

## 2022-07-21 ENCOUNTER — Other Ambulatory Visit: Payer: Self-pay | Admitting: Family Medicine

## 2022-07-21 DIAGNOSIS — E113299 Type 2 diabetes mellitus with mild nonproliferative diabetic retinopathy without macular edema, unspecified eye: Secondary | ICD-10-CM

## 2022-07-24 ENCOUNTER — Other Ambulatory Visit: Payer: Self-pay

## 2022-07-24 DIAGNOSIS — E113299 Type 2 diabetes mellitus with mild nonproliferative diabetic retinopathy without macular edema, unspecified eye: Secondary | ICD-10-CM

## 2022-07-24 MED ORDER — METFORMIN HCL 500 MG PO TABS: 500.0000 mg | ORAL_TABLET | Freq: Two times a day (BID) | ORAL | 0 refills | Status: DC

## 2022-08-07 ENCOUNTER — Telehealth: Payer: Self-pay | Admitting: Family Medicine

## 2022-08-07 NOTE — Telephone Encounter (Signed)
Pt has been called several times on different days and times but straight to VM.

## 2022-08-23 ENCOUNTER — Ambulatory Visit (HOSPITAL_COMMUNITY)
Admission: EM | Admit: 2022-08-23 | Discharge: 2022-08-23 | Disposition: A | Discharge status: Home / Self Care | Payer: Commercial Managed Care - HMO

## 2022-08-23 DIAGNOSIS — Z046 Encounter for general psychiatric examination, requested by authority: Secondary | ICD-10-CM

## 2022-08-23 DIAGNOSIS — Z7689 Persons encountering health services in other specified circumstances: Secondary | ICD-10-CM

## 2022-08-23 NOTE — Discharge Instructions (Addendum)
Discharge recommendations:   Outpatient Follow up: Please follow up with the [facility] at [facility] for outpatient psychiatry and therapy if interested in grief counseling.   Therapy: We recommend that patient participate in individual therapy to address mental health concerns.  Safety:   The following safety precautions should be taken:   No sharp objects. This includes scissors, razors, scrapers, and putty knives.   Chemicals should be removed and locked up.   Medications should be removed and locked up.   Weapons should be removed and locked up. This includes firearms, knives and instruments that can be used to cause injury.   The patient should abstain from use of illicit substances/drugs and abuse of any medications.  If symptoms worsen or do not continue to improve or if the patient becomes actively suicidal or homicidal then it is recommended that the patient return to the closest hospital emergency department, the [facility], or call 911 for further evaluation and treatment. National Suicide Prevention Lifeline 1-800-SUICIDE or 743 359 5651.  About 988 988 offers 24/7 access to trained crisis counselors who can help people experiencing mental health-related distress. People can call or text 988 or chat 988lifeline.org for themselves or if they are worried about a loved one who may need crisis support.
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
## 2022-08-23 NOTE — ED Notes (Signed)
[facility] contacted to transport pt home.

## 2022-08-29 ENCOUNTER — Other Ambulatory Visit: Payer: Self-pay | Admitting: Family Medicine

## 2022-08-29 DIAGNOSIS — E113299 Type 2 diabetes mellitus with mild nonproliferative diabetic retinopathy without macular edema, unspecified eye: Secondary | ICD-10-CM

## 2022-09-25 ENCOUNTER — Other Ambulatory Visit: Payer: Self-pay | Admitting: Family Medicine

## 2022-09-25 DIAGNOSIS — E113299 Type 2 diabetes mellitus with mild nonproliferative diabetic retinopathy without macular edema, unspecified eye: Secondary | ICD-10-CM

## 2022-09-25 DIAGNOSIS — I10 Essential (primary) hypertension: Secondary | ICD-10-CM

## 2022-10-18 ENCOUNTER — Other Ambulatory Visit: Payer: Self-pay | Admitting: Family Medicine

## 2022-10-18 ENCOUNTER — Other Ambulatory Visit: Payer: Self-pay

## 2022-10-18 DIAGNOSIS — E113299 Type 2 diabetes mellitus with mild nonproliferative diabetic retinopathy without macular edema, unspecified eye: Secondary | ICD-10-CM

## 2022-10-18 NOTE — Telephone Encounter (Signed)
Please advise 

## 2022-10-18 NOTE — Telephone Encounter (Signed)
Last refill 09/25/2022 60 tablets Last office visit 04/12/2021

## 2022-10-19 NOTE — Telephone Encounter (Signed)
Looking at my note in February 2023, 6-week follow-up was recommended.  Needs office visit, 30-day supply provided until that visit, no further refills unless she is seen.

## 2022-10-19 NOTE — Telephone Encounter (Signed)
We received a refill request for 90 days for her metformin. Patient hasn't been seen in over a year. I attempted to call her, I had to leave a voicemail. I see that Dr. Neva Seat was sent the refill request by Archie Patten already, will send this to him for information

## 2022-11-21 ENCOUNTER — Other Ambulatory Visit: Payer: Self-pay | Admitting: Family Medicine

## 2022-11-21 DIAGNOSIS — E113299 Type 2 diabetes mellitus with mild nonproliferative diabetic retinopathy without macular edema, unspecified eye: Secondary | ICD-10-CM

## 2022-11-30 ENCOUNTER — Other Ambulatory Visit: Payer: Self-pay | Admitting: Family Medicine

## 2022-11-30 DIAGNOSIS — Z1211 Encounter for screening for malignant neoplasm of colon: Secondary | ICD-10-CM

## 2022-11-30 DIAGNOSIS — Z1212 Encounter for screening for malignant neoplasm of rectum: Secondary | ICD-10-CM

## 2022-12-16 ENCOUNTER — Other Ambulatory Visit: Payer: Self-pay | Admitting: Family Medicine

## 2022-12-16 DIAGNOSIS — E113299 Type 2 diabetes mellitus with mild nonproliferative diabetic retinopathy without macular edema, unspecified eye: Secondary | ICD-10-CM

## 2022-12-18 NOTE — Telephone Encounter (Signed)
Called pt, left V/M to schedule appt before medication will be refilled

## 2022-12-18 NOTE — Telephone Encounter (Signed)
Last visit with me was in February 2023. Temporary supply was provided in August with plan for office visit.  It appears this was temporarily refilled again on September 25.  Will need office visit scheduled prior to further refills.

## 2022-12-19 ENCOUNTER — Other Ambulatory Visit: Payer: Self-pay | Admitting: Family Medicine

## 2022-12-19 DIAGNOSIS — E113299 Type 2 diabetes mellitus with mild nonproliferative diabetic retinopathy without macular edema, unspecified eye: Secondary | ICD-10-CM

## 2023-01-19 ENCOUNTER — Other Ambulatory Visit: Payer: Self-pay | Admitting: Family Medicine

## 2023-01-19 DIAGNOSIS — E113299 Type 2 diabetes mellitus with mild nonproliferative diabetic retinopathy without macular edema, unspecified eye: Secondary | ICD-10-CM

## 2023-09-29 ENCOUNTER — Emergency Department (HOSPITAL_COMMUNITY): Payer: MEDICAID

## 2023-09-29 ENCOUNTER — Other Ambulatory Visit: Payer: Self-pay

## 2023-09-29 ENCOUNTER — Inpatient Hospital Stay (HOSPITAL_COMMUNITY)
Admission: EM | Admit: 2023-09-29 | Discharge: 2023-10-14 | DRG: 637 | Disposition: A | Discharge status: Skilled Nursing Facility | Payer: MEDICAID | Attending: Internal Medicine | Admitting: Internal Medicine

## 2023-09-29 ENCOUNTER — Encounter (HOSPITAL_COMMUNITY): Payer: Self-pay

## 2023-09-29 DIAGNOSIS — Z83438 Family history of other disorder of lipoprotein metabolism and other lipidemia: Secondary | ICD-10-CM

## 2023-09-29 DIAGNOSIS — Z9049 Acquired absence of other specified parts of digestive tract: Secondary | ICD-10-CM

## 2023-09-29 DIAGNOSIS — Z8249 Family history of ischemic heart disease and other diseases of the circulatory system: Secondary | ICD-10-CM

## 2023-09-29 DIAGNOSIS — Z9884 Bariatric surgery status: Secondary | ICD-10-CM

## 2023-09-29 DIAGNOSIS — K219 Gastro-esophageal reflux disease without esophagitis: Secondary | ICD-10-CM | POA: Diagnosis present

## 2023-09-29 DIAGNOSIS — E870 Hyperosmolality and hypernatremia: Secondary | ICD-10-CM | POA: Diagnosis present

## 2023-09-29 DIAGNOSIS — Z803 Family history of malignant neoplasm of breast: Secondary | ICD-10-CM

## 2023-09-29 DIAGNOSIS — F32A Depression, unspecified: Secondary | ICD-10-CM | POA: Diagnosis present

## 2023-09-29 DIAGNOSIS — Z8261 Family history of arthritis: Secondary | ICD-10-CM

## 2023-09-29 DIAGNOSIS — D649 Anemia, unspecified: Secondary | ICD-10-CM | POA: Diagnosis present

## 2023-09-29 DIAGNOSIS — Z7984 Long term (current) use of oral hypoglycemic drugs: Secondary | ICD-10-CM

## 2023-09-29 DIAGNOSIS — E876 Hypokalemia: Secondary | ICD-10-CM | POA: Diagnosis present

## 2023-09-29 DIAGNOSIS — F419 Anxiety disorder, unspecified: Secondary | ICD-10-CM | POA: Diagnosis present

## 2023-09-29 DIAGNOSIS — E875 Hyperkalemia: Secondary | ICD-10-CM | POA: Diagnosis present

## 2023-09-29 DIAGNOSIS — F17210 Nicotine dependence, cigarettes, uncomplicated: Secondary | ICD-10-CM | POA: Diagnosis present

## 2023-09-29 DIAGNOSIS — G9340 Encephalopathy, unspecified: Secondary | ICD-10-CM

## 2023-09-29 DIAGNOSIS — Z5912 Inadequate housing utilities: Secondary | ICD-10-CM

## 2023-09-29 DIAGNOSIS — E042 Nontoxic multinodular goiter: Secondary | ICD-10-CM | POA: Diagnosis present

## 2023-09-29 DIAGNOSIS — Z882 Allergy status to sulfonamides status: Secondary | ICD-10-CM

## 2023-09-29 DIAGNOSIS — F0392 Unspecified dementia, unspecified severity, with psychotic disturbance: Secondary | ICD-10-CM | POA: Diagnosis present

## 2023-09-29 DIAGNOSIS — M6282 Rhabdomyolysis: Secondary | ICD-10-CM | POA: Diagnosis present

## 2023-09-29 DIAGNOSIS — Z794 Long term (current) use of insulin: Secondary | ICD-10-CM

## 2023-09-29 DIAGNOSIS — L89216 Pressure-induced deep tissue damage of right hip: Secondary | ICD-10-CM | POA: Diagnosis present

## 2023-09-29 DIAGNOSIS — Z91148 Patient's other noncompliance with medication regimen for other reason: Secondary | ICD-10-CM

## 2023-09-29 DIAGNOSIS — Z841 Family history of disorders of kidney and ureter: Secondary | ICD-10-CM

## 2023-09-29 DIAGNOSIS — I10 Essential (primary) hypertension: Secondary | ICD-10-CM | POA: Diagnosis present

## 2023-09-29 DIAGNOSIS — W19XXXA Unspecified fall, initial encounter: Secondary | ICD-10-CM | POA: Diagnosis present

## 2023-09-29 DIAGNOSIS — E519 Thiamine deficiency, unspecified: Secondary | ICD-10-CM | POA: Diagnosis present

## 2023-09-29 DIAGNOSIS — Z800 Family history of malignant neoplasm of digestive organs: Secondary | ICD-10-CM

## 2023-09-29 DIAGNOSIS — Z833 Family history of diabetes mellitus: Secondary | ICD-10-CM

## 2023-09-29 DIAGNOSIS — R4701 Aphasia: Secondary | ICD-10-CM | POA: Diagnosis present

## 2023-09-29 DIAGNOSIS — E1110 Type 2 diabetes mellitus with ketoacidosis without coma: Principal | ICD-10-CM | POA: Diagnosis present

## 2023-09-29 DIAGNOSIS — E860 Dehydration: Secondary | ICD-10-CM | POA: Diagnosis present

## 2023-09-29 DIAGNOSIS — E049 Nontoxic goiter, unspecified: Secondary | ICD-10-CM | POA: Diagnosis present

## 2023-09-29 DIAGNOSIS — Z5919 Other inadequate housing: Secondary | ICD-10-CM

## 2023-09-29 DIAGNOSIS — G9341 Metabolic encephalopathy: Secondary | ICD-10-CM | POA: Diagnosis present

## 2023-09-29 DIAGNOSIS — Z79899 Other long term (current) drug therapy: Secondary | ICD-10-CM

## 2023-09-29 DIAGNOSIS — R7989 Other specified abnormal findings of blood chemistry: Secondary | ICD-10-CM | POA: Diagnosis present

## 2023-09-29 LAB — BASIC METABOLIC PANEL WITH GFR
Anion gap: 19 — ABNORMAL HIGH (ref 5–15)
Anion gap: 11 (ref 5–15)
BUN: 34 mg/dL — ABNORMAL HIGH (ref 8–23)
BUN: 35 mg/dL — ABNORMAL HIGH (ref 8–23)
CO2: 21 mmol/L — ABNORMAL LOW (ref 22–32)
CO2: 24 mmol/L (ref 22–32)
Calcium: 9.1 mg/dL (ref 8.9–10.3)
Calcium: 9.2 mg/dL (ref 8.9–10.3)
Chloride: 121 mmol/L — ABNORMAL HIGH (ref 98–111)
Chloride: 125 mmol/L — ABNORMAL HIGH (ref 98–111)
Creatinine, Ser: 1.18 mg/dL — ABNORMAL HIGH (ref 0.44–1.00)
Creatinine, Ser: 0.91 mg/dL (ref 0.44–1.00)
GFR, Estimated: 53 mL/min — ABNORMAL LOW (ref >60)
GFR, Estimated: >60 mL/min (ref >60)
Glucose, Bld: 306 mg/dL — ABNORMAL HIGH (ref 70–99)
Glucose, Bld: 173 mg/dL — ABNORMAL HIGH (ref 70–99)
Potassium: 3.2 mmol/L — ABNORMAL LOW (ref 3.5–5.1)
Potassium: 6.3 mmol/L (ref 3.5–5.1)
Sodium: 161 mmol/L (ref 135–145)
Sodium: 160 mmol/L — ABNORMAL HIGH (ref 135–145)

## 2023-09-29 LAB — CBG MONITORING, ED
Glucose-Capillary: 400 mg/dL — ABNORMAL HIGH (ref 70–99)
Glucose-Capillary: 376 mg/dL — ABNORMAL HIGH (ref 70–99)
Glucose-Capillary: 302 mg/dL — ABNORMAL HIGH (ref 70–99)

## 2023-09-29 LAB — CBC WITH DIFFERENTIAL/PLATELET
Abs Immature Granulocytes: 0.11 K/uL — ABNORMAL HIGH (ref 0.00–0.07)
Basophils Absolute: 0 K/uL (ref 0.0–0.1)
Basophils Relative: 0 %
Eosinophils Absolute: 0 K/uL (ref 0.0–0.5)
Eosinophils Relative: 0 %
HCT: 50.4 % — ABNORMAL HIGH (ref 36.0–46.0)
Hemoglobin: 14.3 g/dL (ref 12.0–15.0)
Immature Granulocytes: 1 %
Lymphocytes Relative: 9 %
Lymphs Abs: 0.9 K/uL (ref 0.7–4.0)
MCH: 27.7 pg (ref 26.0–34.0)
MCHC: 28.4 g/dL — ABNORMAL LOW (ref 30.0–36.0)
MCV: 97.5 fL (ref 80.0–100.0)
Monocytes Absolute: 0.7 K/uL (ref 0.1–1.0)
Monocytes Relative: 7 %
Neutro Abs: 8.2 K/uL — ABNORMAL HIGH (ref 1.7–7.7)
Neutrophils Relative %: 83 %
Platelets: 165 K/uL (ref 150–400)
RBC: 5.17 MIL/uL — ABNORMAL HIGH (ref 3.87–5.11)
RDW: 12.9 % (ref 11.5–15.5)
WBC: 9.9 K/uL (ref 4.0–10.5)
nRBC: 0 % (ref 0.0–0.2)

## 2023-09-29 LAB — GLUCOSE, CAPILLARY
Glucose-Capillary: 247 mg/dL — ABNORMAL HIGH (ref 70–99)
Glucose-Capillary: 210 mg/dL — ABNORMAL HIGH (ref 70–99)
Glucose-Capillary: 162 mg/dL — ABNORMAL HIGH (ref 70–99)
Glucose-Capillary: 164 mg/dL — ABNORMAL HIGH (ref 70–99)
Glucose-Capillary: 142 mg/dL — ABNORMAL HIGH (ref 70–99)

## 2023-09-29 LAB — COMPREHENSIVE METABOLIC PANEL WITH GFR
ALT: 55 U/L — ABNORMAL HIGH (ref 0–44)
AST: 33 U/L (ref 15–41)
Albumin: 3.4 g/dL — ABNORMAL LOW (ref 3.5–5.0)
Alkaline Phosphatase: 80 U/L (ref 38–126)
Anion gap: 19 — ABNORMAL HIGH (ref 5–15)
BUN: 39 mg/dL — ABNORMAL HIGH (ref 8–23)
CO2: 18 mmol/L — ABNORMAL LOW (ref 22–32)
Calcium: 9 mg/dL (ref 8.9–10.3)
Chloride: 123 mmol/L — ABNORMAL HIGH (ref 98–111)
Creatinine, Ser: 1.11 mg/dL — ABNORMAL HIGH (ref 0.44–1.00)
GFR, Estimated: 57 mL/min — ABNORMAL LOW (ref >60)
Glucose, Bld: 434 mg/dL — ABNORMAL HIGH (ref 70–99)
Potassium: 4 mmol/L (ref 3.5–5.1)
Sodium: 160 mmol/L — ABNORMAL HIGH (ref 135–145)
Total Bilirubin: 1.8 mg/dL — ABNORMAL HIGH (ref 0.0–1.2)
Total Protein: 6.3 g/dL — ABNORMAL LOW (ref 6.5–8.1)

## 2023-09-29 LAB — BLOOD GAS, VENOUS
Acid-base deficit: 7.3 mmol/L — ABNORMAL HIGH (ref 0.0–2.0)
Bicarbonate: 19.3 mmol/L — ABNORMAL LOW (ref 20.0–28.0)
O2 Saturation: 65.1 %
Patient temperature: 37
pCO2, Ven: 42 mmHg — ABNORMAL LOW (ref 44–60)
pH, Ven: 7.27 (ref 7.25–7.43)
pO2, Ven: 42 mmHg (ref 32–45)

## 2023-09-29 LAB — AMMONIA: Ammonia: 18 umol/L (ref 9–35)

## 2023-09-29 LAB — TROPONIN I (HIGH SENSITIVITY)
Troponin I (High Sensitivity): 259 ng/L (ref <18)
Troponin I (High Sensitivity): 221 ng/L (ref <18)

## 2023-09-29 LAB — MAGNESIUM: Magnesium: 2.1 mg/dL (ref 1.7–2.4)

## 2023-09-29 LAB — BETA-HYDROXYBUTYRIC ACID
Beta-Hydroxybutyric Acid: 2.88 mmol/L — ABNORMAL HIGH (ref 0.05–0.27)
Beta-Hydroxybutyric Acid: 0.42 mmol/L — ABNORMAL HIGH (ref 0.05–0.27)

## 2023-09-29 LAB — HIV ANTIBODY (ROUTINE TESTING W REFLEX): HIV Screen 4th Generation wRfx: NONREACTIVE

## 2023-09-29 LAB — TSH: TSH: 0.274 u[IU]/mL — ABNORMAL LOW (ref 0.350–4.500)

## 2023-09-29 LAB — CK: Total CK: 938 U/L — ABNORMAL HIGH (ref 38–234)

## 2023-09-29 LAB — VITAMIN B12: Vitamin B-12: 1035 pg/mL — ABNORMAL HIGH (ref 180–914)

## 2023-09-29 MED ORDER — ONDANSETRON HCL 4 MG/2ML IJ SOLN: 4.0000 mg | Freq: Four times a day (QID) | INTRAMUSCULAR | Status: DC | PRN

## 2023-09-29 MED ORDER — POTASSIUM CHLORIDE 10 MEQ/100ML IV SOLN
10.0000 meq | INTRAVENOUS | Status: AC
  Administered 2023-09-29: 10 meq via INTRAVENOUS
  Filled 2023-09-29 (×2): qty 100

## 2023-09-29 MED ORDER — ONDANSETRON HCL 4 MG PO TABS: 4.0000 mg | ORAL_TABLET | Freq: Four times a day (QID) | ORAL | Status: DC | PRN

## 2023-09-29 MED ORDER — POTASSIUM CHLORIDE 10 MEQ/100ML IV SOLN
10.0000 meq | INTRAVENOUS | Status: AC
  Administered 2023-09-29 (×3): 10 meq via INTRAVENOUS
  Filled 2023-09-29 (×3): qty 100

## 2023-09-29 MED ORDER — ACETAMINOPHEN 650 MG RE SUPP
650.0000 mg | Freq: Four times a day (QID) | RECTAL | Status: DC | PRN
Start: 2023-09-29 — End: 2023-10-15

## 2023-09-29 MED ORDER — ORAL CARE MOUTH RINSE: 15.0000 mL | OROMUCOSAL | Status: DC | PRN

## 2023-09-29 MED ORDER — SODIUM CHLORIDE 0.9 % IV BOLUS
500.0000 mL | Freq: Once | INTRAVENOUS | Status: AC
  Administered 2023-09-29: 500 mL via INTRAVENOUS

## 2023-09-29 MED ORDER — INSULIN REGULAR(HUMAN) IN NACL 100-0.9 UT/100ML-% IV SOLN
INTRAVENOUS | Status: DC
  Administered 2023-09-29: 14 [IU]/h via INTRAVENOUS
  Filled 2023-09-29: qty 100

## 2023-09-29 MED ORDER — ACETAMINOPHEN 325 MG PO TABS
650.0000 mg | ORAL_TABLET | Freq: Four times a day (QID) | ORAL | Status: DC | PRN
  Administered 2023-10-02 – 2023-10-09 (×3): 650 mg via ORAL
  Filled 2023-09-29 (×2): qty 2

## 2023-09-29 MED ORDER — DEXTROSE 50 % IV SOLN: 0.0000 mL | INTRAVENOUS | Status: DC | PRN

## 2023-09-29 MED ORDER — DEXTROSE IN LACTATED RINGERS 5 % IV SOLN: INTRAVENOUS | Status: DC

## 2023-09-29 MED ORDER — CHLORHEXIDINE GLUCONATE CLOTH 2 % EX PADS
6.0000 | MEDICATED_PAD | Freq: Every day | CUTANEOUS | Status: DC
  Administered 2023-09-29 – 2023-10-12 (×12): 6 via TOPICAL

## 2023-09-29 MED ORDER — LACTATED RINGERS IV BOLUS
20.0000 mL/kg | Freq: Once | INTRAVENOUS | Status: AC
  Administered 2023-09-29: 1000 mL via INTRAVENOUS

## 2023-09-29 MED ORDER — LACTATED RINGERS IV SOLN: INTRAVENOUS | Status: DC

## 2023-09-29 NOTE — Assessment & Plan Note (Signed)
 On no medication for months  Blood pressure has been normotensive to mildly elevated Continue to monitor for now
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
## 2023-09-29 NOTE — Assessment & Plan Note (Signed)
 Per nursing patient at home with no power/clean water Worsening mental decline? - will need TOC assistance once more medically stable

## 2023-09-29 NOTE — Assessment & Plan Note (Addendum)
 Prior treatment by endocrinology with stable ultrasound previously.  TSH wnl 03/2021  Repeat pending

## 2023-09-29 NOTE — Assessment & Plan Note (Addendum)
 Sodium of 160 on presentation, but higher with glucose of 450  Water deficit of 5.6L likely underestimated using sodium of 160 Fluid resuscitated in ED with DKA protocol  Briefly discussed with nephrology and continue with fluid resuscitation. Did not officially consult  Follow serum sodium closely q 4 hours

## 2023-09-29 NOTE — Assessment & Plan Note (Addendum)
?   Acute on chronic  CT head negative for acute finding  Per chart review/nursing husband reports that she has had a mental decline and aggression over the past few months that prompted him to move out, unsure how long she has lived alone.  He reports visual/auditory hallucinations of seeing her dead son and having conversations with him  Has not taken medications in months Reported unfit living conditions with no power, no clean water and poor PO intake. Unsure how getting food  Metabolic labs pending, brain MRI ordered  No obvious infectious cause, UA pending and has vesicles? Burn on right side but does not appear infected. Also beetle/maggots?  Slowly correct hypernatremia  Delirium precautions

## 2023-09-29 NOTE — Progress Notes (Signed)
 Pt is currently confused at the moment. There is no family member listed in the chart to notify that patient has been admitted to ICU/SD
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
## 2023-09-29 NOTE — H&P (Addendum)
 History and Physical    Patient: Suzanne Duke FMW:[id number] DOB: 02-26-1961 DOA: 09/29/2023 DOS: the patient was seen and examined on 09/29/2023 PCP: Levora Reyes [name], MD  Patient coming from: Home - lives with her husband. She does not use a walker or cane at home.    Chief Complaint: AMS/found down on porch   HPI: Suzanne Duke is a 63 y.o. female with medical history significant of HTN, T2DM, GERD, anxiety and depression, s/p bariatric surgery, IDA, NASH, thyroid  goiter who presented to ED after her husband saw her lying on her porch for unknown amount of time. She was covered in urine and insects per chart review from EMS. She doesn't remember any events leading up to be on the porch or coming to the hospital. She can't recall what she had for dinner last night. She really has no memory of events from yesterday. She almost has aphasia when asked certain questions. She can't recall names of her medications. She can't recall what she did yesterday.   Her nurse in ED spoke with her husband who apparently moved out months ago due to her becoming more difficult and aggressive toward him. She has had a mental decline, hallucinations of seeing her dead son and poor living conditions. She has no power or ? Clean water in her home. She has had poor PO intake and has not been taking her medications in months. He actually called EMS today. There is no contact information in her chart so can not reach husband.    ER Course:  vitals: afebrile, bp: 1329/106, HR: 85, RR: 13, oxygen: 98%RA Pertinent labs: hct: 50.4, sodium: 160, chloride: 123, CO2: 18, BUN: 39, creatinine: 1.11, ALT: 55, AG: 19, VBG: pH: 7.27,  CT head: no acute finding  In ED: given 500cc NS bolus, LR 1632 bolus and started on insulin  gtt per protocol. TRH asked to admit.    Review of Systems: unable to review all systems due to the inability of the patient to answer questions. Past Medical History:  Diagnosis Date   ANEMIA-IRON  DEFICIENCY 02/17/2007   ANXIETY 02/17/2007   Arthritis    DEPRESSION 02/17/2007   DM 07/17/2007   Edema 07/21/2008   GERD 05/11/2010   GOITER, NONTOXIC MULTINODULAR 01/07/2007   HYPERTENSION 09/21/2006   LUMBAR RADICULOPATHY 01/13/2007   Morbid obesity (HCC)    lost >200 lbs following bariatric surgery   UNSPECIFIED NEURALGIA NEURITIS AND RADICULITIS 01/07/2007   Past Surgical History:  Procedure Laterality Date   CHOLECYSTECTOMY  02/26/1990   ELECTROCARDIOGRAM  08/22/2005   GASTRIC BYPASS OPEN  02/26/2006   INCISION AND DRAINAGE PERIRECTAL ABSCESS  02/27/1987   s/p   TREATMENT FISTULA ANAL  02/27/1991   Social History:  reports that she has been smoking cigarettes. She has never used smokeless tobacco. She reports that she does not drink alcohol and does not use drugs.  Allergies  Allergen Reactions   Sulfonamide Derivatives     Family History  Problem Relation Age of Onset   Cancer Mother        Breast Cancer   Diabetes Mother    Renal Disease Mother    Heart disease Mother    Hyperlipidemia Mother    Cancer Sister 34       Colon Cancer   Hypertension Father    Cancer Father        Prostate   Diabetes Father    Arthritis Father    Diabetes Sister    Diabetes Brother  Hypertension Brother     Prior to Admission medications   Medication Sig Start Date End Date Taking? Authorizing Provider  losartan  (COZAAR ) 100 MG tablet TAKE 1/2 TABLET BY MOUTH DAILY 09/25/22   Levora Reyes SAUNDERS, MD  metFORMIN  (GLUCOPHAGE ) 500 MG tablet TAKE 1 TABLET BY MOUTH TWICE A DAY WITH FOOD 01/21/23   Levora Reyes SAUNDERS, MD  Multiple Vitamin (MULTIVITAMIN) tablet Take 1 tablet by mouth 2 (two) times daily.    [provider]    Physical Exam: Vitals:   09/29/23 1500 09/29/23 1749 09/29/23 1750 09/29/23 1800  BP: (!) 152/82 (!) 145/74  116/74  Pulse: 76   (!) 108  Resp: 15 (!) 24  18  Temp:   98.3 F (36.8 C)   TempSrc:   Oral   SpO2: 99%   99%  Weight:   79.8 kg    Height:   5' 7 (1.702 m)    General:  Appears calm and comfortable and is in NAD Eyes:  PERRL, EOMI, normal lids, iris ENT:  grossly normal hearing, lips & tongue, dry  mucous membranes; edentulous. Dried secretions on side of face and down cheek  Neck:  no LAD, thyroid  goiter on right side; no carotid bruits Cardiovascular:  RRR, no m/r/g. No LE edema.  Respiratory:   CTA bilaterally with no wheezes/rales/rhonchi.  Normal respiratory effort. Abdomen:  soft, NT, ND, NABS Back:   normal alignment, no CVAT Skin:  no rash or induration seen on limited exam. She has multiple vesicles on erythematous base on right hip/lower back  Musculoskeletal:  grossly normal UE, bilateral LE weakness, but u/a to follow directions.  good ROM, no bony abnormality Lower extremity:  No LE edema.  Limited foot exam with no ulcerations.  2+ distal pulses. Psychiatric:  grossly normal mood and affect, speech fluent, appears aphasic at times.  Aox1  Neurologic:  CN 2-12 grossly intact, moves all extremities in coordinated fashion, sensation intact   Radiological Exams on Admission: Independently reviewed - see discussion in A/P where applicable  DG CHEST PORT 1 VIEW Result Date: 09/29/2023 CLINICAL DATA:  DKA EXAM: PORTABLE CHEST 1 VIEW COMPARISON:  04/13/2017 FINDINGS: The heart size and mediastinal contours are within normal limits. Both lungs are clear. The visualized skeletal structures are unremarkable. IMPRESSION: No active disease. Electronically Signed   By: Ozell Daring M.D.   On: 09/29/2023 16:31   CT Head Wo Contrast Result Date: 09/29/2023 CLINICAL DATA:  Altered level of consciousness EXAM: CT HEAD WITHOUT CONTRAST TECHNIQUE: Contiguous axial images were obtained from the base of the skull through the vertex without intravenous contrast. RADIATION DOSE REDUCTION: This exam was performed according to the departmental dose-optimization program which includes automated exposure control, adjustment of the  mA and/or kV according to patient size and/or use of iterative reconstruction technique. COMPARISON:  None Available. FINDINGS: Brain: No acute infarct or hemorrhage. Lateral ventricles and midline structures are unremarkable. No acute extra-axial fluid collections. No mass effect. Vascular: No hyperdense vessel or unexpected calcification. Skull: Normal. Negative for fracture or focal lesion. Sinuses/Orbits: No acute finding. Other: None. IMPRESSION: 1. No acute intracranial process. Electronically Signed   By: Ozell Daring M.D.   On: 09/29/2023 15:22    EKG: Independently reviewed.  NSR with rate 81; nonspecific ST changes with no evidence of acute ischemia   Labs on Admission: I have personally reviewed the available labs and imaging studies at the time of the admission.  Pertinent labs:   hct: 50.4,  sodium: 160,  chloride: 123,  CO2: 18,  BUN: 39,  creatinine: 1.11, ALT: 55,  AG: 19,  VBG pH: 7.27  Assessment and Plan: Principal Problem:   DKA, type 2 (HCC) Active Problems:   Hypernatremia   Acute encephalopathy   Unsatisfactory living conditions   Essential hypertension   GOITER, NONTOXIC MULTINODULAR    Assessment and Plan: * DKA, type 48 (HCC) 63 year old presenting to ED after found on her porch for unknown period time in unfit living conditions found to be in DKA from type 2 diabetes with severe dehydration  -admit to stepdown  -likely poor control, last A1C was in 2023. Repeat pending but she has not been taking her medication for months with unfit living conditions  -No indication of illness as source, but waiting on UA  -has blisters on right hip/lower back. ? Burn and appears to hurt her, but no sign of infection. Closely monitor  -bug infestation, but no signs of scabies/bed bugs. Appear to be beetle with ? Parasite or maggot - DKA protocol initiated in ED  -K+ normal at time of presentation and so potassium supplementation added -IVF at 125 cc/hr, LR until  glucose <250 and then decrease rate to 125 and change to D5LR -SW consult, diabetic educator      Hypernatremia Sodium of 160 on presentation, but higher with glucose of 450  Water deficit of 5.6L likely underestimated using sodium of 160 Fluid resuscitated in ED with DKA protocol  Briefly discussed with nephrology and continue with fluid resuscitation. Did not officially consult  Follow serum sodium closely q 4 hours    Acute encephalopathy ? Acute on chronic  CT head negative for acute finding  Per chart review/nursing husband reports that she has had a mental decline and aggression over the past few months that prompted him to move out, unsure how long she has lived alone.  He reports visual/auditory hallucinations of seeing her dead son and having conversations with him  Has not taken medications in months Reported unfit living conditions with no power, no clean water and poor PO intake. Unsure how getting food  Metabolic labs pending, brain MRI ordered  No obvious infectious cause, UA pending and has vesicles? Burn on right side but does not appear infected. Also beetle/maggots?  Slowly correct hypernatremia  Delirium precautions   Unsatisfactory living conditions Per nursing patient at home with no power/clean water Worsening decline mental with poor PO intake No contact for husband who no longer lives with her SW consult for placement   Essential hypertension On no medication for months  Blood pressure has been normotensive to mildly elevated Continue to monitor for now   GOITER, NONTOXIC MULTINODULAR Prior treatment by endocrinology with stable ultrasound previously.  TSH wnl 03/2021  Repeat pending      Advance Care Planning:   Code Status: Full Code   Consults: SW  DVT Prophylaxis: SCDs   Family Communication: no contact in chart.   Severity of Illness: The appropriate patient status for this patient is INPATIENT. Inpatient status is judged to be  reasonable and necessary in order to provide the required intensity of service to ensure the patient's safety. The patient's presenting symptoms, physical exam findings, and initial radiographic and laboratory data in the context of their chronic comorbidities is felt to place them at high risk for further clinical deterioration. Furthermore, it is not anticipated that the patient will be medically stable for discharge from the hospital within 2 midnights of  admission.   * I certify that at the point of admission it is my clinical judgment that the patient will require inpatient hospital care spanning beyond 2 midnights from the point of admission due to high intensity of service, high risk for further deterioration and high frequency of surveillance required.*  Author: Isaiah Geralds, MD 09/29/2023 7:12 PM  For on call review [email or web address].

## 2023-09-29 NOTE — Assessment & Plan Note (Addendum)
 63 year old presenting to ED after found on her porch for unknown period time in unfit living conditions found to be in DKA from type 2 diabetes with severe dehydration  -admit to stepdown  -likely poor control, last A1C was in 2023. Repeat pending but she has not been taking her medication for months with unfit living conditions  -No indication of illness as source, but waiting on UA  -has blisters on right hip/lower back. ? Burn and appears to hurt her, but no sign of infection. Closely monitor  -bug infestation, but no signs of scabies/bed bugs. Appear to be beetle with ? Parasite or maggot - DKA protocol initiated in ED  -K+ normal at time of presentation and so potassium supplementation added -IVF at 125 cc/hr, LR until glucose <250 and then decrease rate to 125 and change to D5LR -SW consult, diabetic educator

## 2023-09-30 ENCOUNTER — Inpatient Hospital Stay (HOSPITAL_COMMUNITY): Payer: MEDICAID

## 2023-09-30 DIAGNOSIS — Z5919 Other inadequate housing: Secondary | ICD-10-CM

## 2023-09-30 DIAGNOSIS — E870 Hyperosmolality and hypernatremia: Secondary | ICD-10-CM

## 2023-09-30 DIAGNOSIS — G9341 Metabolic encephalopathy: Secondary | ICD-10-CM

## 2023-09-30 DIAGNOSIS — E875 Hyperkalemia: Secondary | ICD-10-CM

## 2023-09-30 DIAGNOSIS — R7989 Other specified abnormal findings of blood chemistry: Secondary | ICD-10-CM | POA: Insufficient documentation

## 2023-09-30 LAB — COMPREHENSIVE METABOLIC PANEL WITH GFR
ALT: 48 U/L — ABNORMAL HIGH (ref 0–44)
AST: 36 U/L (ref 15–41)
Albumin: 2.9 g/dL — ABNORMAL LOW (ref 3.5–5.0)
Alkaline Phosphatase: 67 U/L (ref 38–126)
Anion gap: 13 (ref 5–15)
BUN: 35 mg/dL — ABNORMAL HIGH (ref 8–23)
CO2: 26 mmol/L (ref 22–32)
Calcium: 9.2 mg/dL (ref 8.9–10.3)
Chloride: 123 mmol/L — ABNORMAL HIGH (ref 98–111)
Creatinine, Ser: 0.82 mg/dL (ref 0.44–1.00)
GFR, Estimated: >60 mL/min (ref >60)
Glucose, Bld: 200 mg/dL — ABNORMAL HIGH (ref 70–99)
Potassium: 3.9 mmol/L (ref 3.5–5.1)
Sodium: 162 mmol/L (ref 135–145)
Total Bilirubin: 0.9 mg/dL (ref 0.0–1.2)
Total Protein: 5.7 g/dL — ABNORMAL LOW (ref 6.5–8.1)

## 2023-09-30 LAB — BASIC METABOLIC PANEL WITH GFR
Anion gap: 11 (ref 5–15)
Anion gap: 12 (ref 5–15)
BUN: 35 mg/dL — ABNORMAL HIGH (ref 8–23)
BUN: 36 mg/dL — ABNORMAL HIGH (ref 8–23)
CO2: 26 mmol/L (ref 22–32)
CO2: 28 mmol/L (ref 22–32)
Calcium: 9 mg/dL (ref 8.9–10.3)
Calcium: 9.2 mg/dL (ref 8.9–10.3)
Chloride: 122 mmol/L — ABNORMAL HIGH (ref 98–111)
Chloride: 124 mmol/L — ABNORMAL HIGH (ref 98–111)
Creatinine, Ser: 0.92 mg/dL (ref 0.44–1.00)
Creatinine, Ser: 0.98 mg/dL (ref 0.44–1.00)
GFR, Estimated: >60 mL/min (ref >60)
GFR, Estimated: >60 mL/min (ref >60)
Glucose, Bld: 196 mg/dL — ABNORMAL HIGH (ref 70–99)
Glucose, Bld: 222 mg/dL — ABNORMAL HIGH (ref 70–99)
Potassium: 3.8 mmol/L (ref 3.5–5.1)
Potassium: 4.3 mmol/L (ref 3.5–5.1)
Sodium: 161 mmol/L (ref 135–145)
Sodium: 162 mmol/L (ref 135–145)

## 2023-09-30 LAB — CBC
HCT: 50.8 % — ABNORMAL HIGH (ref 36.0–46.0)
Hemoglobin: 14.9 g/dL (ref 12.0–15.0)
MCH: 28.1 pg (ref 26.0–34.0)
MCHC: 29.3 g/dL — ABNORMAL LOW (ref 30.0–36.0)
MCV: 95.8 fL (ref 80.0–100.0)
Platelets: 161 K/uL (ref 150–400)
RBC: 5.3 MIL/uL — ABNORMAL HIGH (ref 3.87–5.11)
RDW: 13 % (ref 11.5–15.5)
WBC: 11 K/uL — ABNORMAL HIGH (ref 4.0–10.5)
nRBC: 0 % (ref 0.0–0.2)

## 2023-09-30 LAB — GLUCOSE, CAPILLARY
Glucose-Capillary: 163 mg/dL — ABNORMAL HIGH (ref 70–99)
Glucose-Capillary: 185 mg/dL — ABNORMAL HIGH (ref 70–99)
Glucose-Capillary: 224 mg/dL — ABNORMAL HIGH (ref 70–99)
Glucose-Capillary: 226 mg/dL — ABNORMAL HIGH (ref 70–99)
Glucose-Capillary: 249 mg/dL — ABNORMAL HIGH (ref 70–99)
Glucose-Capillary: 277 mg/dL — ABNORMAL HIGH (ref 70–99)
Glucose-Capillary: 280 mg/dL — ABNORMAL HIGH (ref 70–99)

## 2023-09-30 LAB — TROPONIN I (HIGH SENSITIVITY)
Troponin I (High Sensitivity): 251 ng/L (ref <18)
Troponin I (High Sensitivity): 263 ng/L (ref <18)

## 2023-09-30 LAB — BETA-HYDROXYBUTYRIC ACID
Beta-Hydroxybutyric Acid: 0.14 mmol/L (ref 0.05–0.27)
Beta-Hydroxybutyric Acid: 0.27 mmol/L (ref 0.05–0.27)
Beta-Hydroxybutyric Acid: 0.36 mmol/L — ABNORMAL HIGH (ref 0.05–0.27)

## 2023-09-30 LAB — T4, FREE: Free T4: 1.02 ng/dL (ref 0.61–1.12)

## 2023-09-30 MED ORDER — LACTATED RINGERS IV SOLN: INTRAVENOUS | Status: DC

## 2023-09-30 MED ORDER — INSULIN ASPART 100 UNIT/ML IJ SOLN
0.0000 [IU] | INTRAMUSCULAR | Status: DC
  Administered 2023-09-30: 8 [IU] via SUBCUTANEOUS
  Administered 2023-09-30: 3 [IU] via SUBCUTANEOUS
  Administered 2023-09-30: 8 [IU] via SUBCUTANEOUS
  Administered 2023-09-30 (×3): 5 [IU] via SUBCUTANEOUS
  Administered 2023-09-30: 3 [IU] via SUBCUTANEOUS

## 2023-09-30 MED ORDER — CARMEX CLASSIC LIP BALM EX OINT: TOPICAL_OINTMENT | CUTANEOUS | Status: DC | PRN

## 2023-09-30 NOTE — Assessment & Plan Note (Signed)
Now normalized

## 2023-09-30 NOTE — Assessment & Plan Note (Signed)
-   Suspecting demand ischemia in setting of being found down and significantly dehydrated with DKA -No reports of chest pain -EKG looks like sinus rhythm with a lot of artifact; it was read as A-fib but does not appear to be real; no obvious signs of ischemia appreciated either -Troponin trend also flat

## 2023-09-30 NOTE — Hospital Course (Signed)
 Suzanne Duke is a 63 year old female with past medical history of DM II, HTN, GERD, anxiety/depression, bariatric surgery, IDA, NASH, thyroid  goiter presented to hospital from home after being found on her porch for unknown amount of time.  She was seen by her neighbor and EMS was called thin.  She was noted to be swelling urine and in 6 Endoxana.  In the ED patient was grossly confused.  It was reported that her husband moved out a few months prior due to change in her behavior and aggression.  Patient was noted to be in DKA and was started on IV fluids and insulin  drip.  She was also noted to have significant hypernatremia in the setting of volume depletion and dehydration.  DKA, type 2  Resolved after insulin  drip.  Currently on basal insulin /sliding scale insulin , diabetic diet.    Hypernatremia Received IV fluid hydration with improvement of sodium to 141 at this time.  Still on LR 100 mL/h.   Normocytic anemia Hemoglobin today at 11.6.  Initially hemodiluted. Folate low, at 3 and starting on folic acid  supplement. B12 level low/normal 390 and will start replacement as well   Acute metabolic encephalopathy with possible underlying dementia. - Unclear what her baseline is and no family able to provide collateral information. MRI of the brain with parenchymal loss.  Continue thiamine .  Check vitamin B1 level. ammonia within normal limits.  Might benefit from psych evaluation.  Was on safety observation   Rhabdomyolysis - Initial CK9 938 on admission.  On IV fluids.  Latest CK level of 250.  Unsatisfactory living conditions Continue TOC assistance.   Essential hypertension Not on medications for months now.   Nontoxic multinodular goiter. Prior treatment by endocrinology with stable ultrasound previously.  TSH  suppressed, 0.274.  Free T4 at 1.0.   Elevated troponin Flat troponins.  No chest pain.  EKG with sinus rhythm.  Hyperkalemia followed by mild hypokalemia at this time.  Will  replace with oral potassium.

## 2023-09-30 NOTE — Plan of Care (Signed)
  Problem: Coping: Goal: Ability to adjust to condition or change in health will improve Outcome: Progressing   Problem: Metabolic: Goal: Ability to maintain appropriate glucose levels will improve Outcome: Progressing   Problem: Nutritional: Goal: Maintenance of adequate nutrition will improve Outcome: Progressing    Problem: Pain Managment: Goal: General experience of comfort will improve and/or be controlled Outcome: Progressing

## 2023-09-30 NOTE — Progress Notes (Signed)
 Progress Note    Suzanne Duke   FMW:[id number]  DOB: [birth year]  DOA: 09/29/2023     1 PCP: Levora Reyes [name], MD  Initial CC: Found down  Hospital Course: Suzanne Duke is a 63 year old female with PMH DM II, HTN, GERD, anxiety/depression, bariatric surgery, IDA, NASH, thyroid  goiter who presented after being found down on her porch for an unknown amount of time.  She was seen by a Dentist on the porch and EMS was called.  Upon arrival she was found to be soiled in urine and with insects/bugs on her. Patient was grossly confused and unable to provide any collateral information on admission. It was reported that her husband moved out a few months prior due to change in her behavior and aggression. She seems to deny any suicidal ideation or intent. On workup, she was found to be in DKA and started on fluids and insulin  drip.  She was also found to have significant hypernatremia in the setting of volume depletion/dehydration.  Interval History:  Awake and talking this morning but still grossly confused.  Only able to tell me her name.  She can otherwise follow all commands but does not recall any of the events leading up to her being found down on the porch.  She also cannot tell me much history regarding her husband and how long he has been out of the house.  Assessment and Plan: * DKA, type 2 (HCC)-resolved as of 09/30/2023 - unclear cause, but for now sounds like volume depletion and down on the floor u/k amount of time and missed meds, etc - responded well to insulin  gtt and IVF on admission; now transitioned to SSI; will resume on a diet too and see how tolerates   Hypernatremia - Sodium of 160 on presentation, but higher with glucose of 450  - Has undergone fluid resuscitation on admission and now diet being resumed -Will continue trending sodium as needed; should improve with mentation improving and oral intake improving  Acute metabolic encephalopathy - Unclear what her  baseline is and no family able to provide collateral although husband did call yesterday and spoke to the hospital briefly but was unable to be given much information since he was not on the chart - She provided a number for Junior her son, but H&P also says she was having hallucination of seeing her deceased son; Left a VM on the number she gave in case - will treat medical issues and reverse as much as possible then likely needs psych eval at that point - she's oriented to her name and knows she's in a hospital. Couldn't answer much else orientation wise, but does easily follow commands and no obvious neuro deficits - follow up MRI brain - Ammonia normal  Unsatisfactory living conditions Per nursing patient at home with no power/clean water Worsening mental decline? - will need TOC assistance once more medically stable  Essential hypertension On no medication for months  Blood pressure has been normotensive to mildly elevated  GOITER, NONTOXIC MULTINODULAR Prior treatment by endocrinology with stable ultrasound previously.  TSH wnl 03/2021  - TSH suppressed, 0.274 - check FT4 and TT3  Elevated troponin - Suspecting demand ischemia in setting of being found down and significantly dehydrated with DKA -No reports of chest pain -EKG looks like sinus rhythm with a lot of artifact; it was read as A-fib but does not appear to be real; no obvious signs of ischemia appreciated either -Troponin trend also flat  Hyperkalemia-resolved as  of 09/30/2023 - Now normalized    Old records reviewed in assessment of this patient  Antimicrobials:   DVT prophylaxis:  SCDs Start: 09/29/23 1659   Code Status:   Code Status: Full Code  Mobility Assessment (Last 72 Hours)     Mobility Assessment     Row Name 09/30/23 0800 09/30/23 0000 09/29/23 1800       Does the patient have exclusion criteria? No - Perform mobility assessment No - Perform mobility assessment No - Perform mobility  assessment     What is the highest level of mobility based on the mobility assessment? Level 1 (Bedfast) - Unable to balance while sitting on edge of bed Level 1 (Bedfast) - Unable to balance while sitting on edge of bed Level 1 (Bedfast) - Unable to balance while sitting on edge of bed     Is the above level different from baseline mobility prior to current illness? Yes - Recommend PT order Yes - Recommend PT order Yes - Recommend PT order        Barriers to discharge: None Disposition Plan: TBD HH orders placed: TBD Status is: Inpatient  Objective: Blood pressure 138/68, pulse 97, temperature 98.2 F (36.8 C), temperature source Oral, resp. rate 14, height 5' 7 (1.702 m), weight 79.8 kg, SpO2 100%.  Examination:  Physical Exam Constitutional:      Comments: Adult woman resting in bed in no distress appearing confused  HENT:     Head: Normocephalic and atraumatic.     Mouth/Throat:     Mouth: Mucous membranes are moist.  Eyes:     Extraocular Movements: Extraocular movements intact.  Cardiovascular:     Rate and Rhythm: Normal rate and regular rhythm.  Pulmonary:     Effort: Pulmonary effort is normal. No respiratory distress.     Breath sounds: Normal breath sounds. No wheezing.  Abdominal:     General: Bowel sounds are normal. There is no distension.     Palpations: Abdomen is soft.     Tenderness: There is no abdominal tenderness.  Musculoskeletal:        General: Normal range of motion.     Cervical back: Normal range of motion and neck supple.  Skin:    General: Skin is warm and dry.  Neurological:     Comments: Oriented only to name.  After coaching was able to state she was in a hospital but did not know the name.  Unable to recall year or president.  Follows all commands easily and no focal neurodeficits otherwise      Consultants:    Procedures:    Data Reviewed: Results for orders placed or performed during the hospital encounter of 09/29/23 (from the  past 24 hours)  CBC with Differential     Status: Abnormal   Collection Time: 09/29/23 12:34 PM  Result Value Ref Range   WBC 9.9 4.0 - 10.5 K/uL   RBC 5.17 (H) 3.87 - 5.11 MIL/uL   Hemoglobin 14.3 12.0 - 15.0 g/dL   HCT 49.5 (H) 63.9 - 53.9 %   MCV 97.5 80.0 - 100.0 fL   MCH 27.7 26.0 - 34.0 pg   MCHC 28.4 (L) 30.0 - 36.0 g/dL   RDW 87.0 88.4 - 84.4 %   Platelets 165 150 - 400 K/uL   nRBC 0.0 0.0 - 0.2 %   Neutrophils Relative % 83 %   Neutro Abs 8.2 (H) 1.7 - 7.7 K/uL   Lymphocytes Relative 9 %  Lymphs Abs 0.9 0.7 - 4.0 K/uL   Monocytes Relative 7 %   Monocytes Absolute 0.7 0.1 - 1.0 K/uL   Eosinophils Relative 0 %   Eosinophils Absolute 0.0 0.0 - 0.5 K/uL   Basophils Relative 0 %   Basophils Absolute 0.0 0.0 - 0.1 K/uL   Immature Granulocytes 1 %   Abs Immature Granulocytes 0.11 (H) 0.00 - 0.07 K/uL  Comprehensive metabolic panel     Status: Abnormal   Collection Time: 09/29/23 12:34 PM  Result Value Ref Range   Sodium 160 (H) 135 - 145 mmol/L   Potassium 4.0 3.5 - 5.1 mmol/L   Chloride 123 (H) 98 - 111 mmol/L   CO2 18 (L) 22 - 32 mmol/L   Glucose, Bld 434 (H) 70 - 99 mg/dL   BUN 39 (H) 8 - 23 mg/dL   Creatinine, Ser 8.88 (H) 0.44 - 1.00 mg/dL   Calcium 9.0 8.9 - 89.6 mg/dL   Total Protein 6.3 (L) 6.5 - 8.1 g/dL   Albumin  3.4 (L) 3.5 - 5.0 g/dL   AST 33 15 - 41 U/L   ALT 55 (H) 0 - 44 U/L   Alkaline Phosphatase 80 38 - 126 U/L   Total Bilirubin 1.8 (H) 0.0 - 1.2 mg/dL   GFR, Estimated 57 (L) >60 mL/min   Anion gap 19 (H) 5 - 15  POC CBG, ED     Status: Abnormal   Collection Time: 09/29/23  1:05 PM  Result Value Ref Range   Glucose-Capillary 400 (H) 70 - 99 mg/dL  Blood gas, venous     Status: Abnormal   Collection Time: 09/29/23  1:52 PM  Result Value Ref Range   pH, Ven 7.27 7.25 - 7.43   pCO2, Ven 42 (L) 44 - 60 mmHg   pO2, Ven 42 32 - 45 mmHg   Bicarbonate 19.3 (L) 20.0 - 28.0 mmol/L   Acid-base deficit 7.3 (H) 0.0 - 2.0 mmol/L   O2 Saturation 65.1 %    Patient temperature 37.0   CBG monitoring, ED     Status: Abnormal   Collection Time: 09/29/23  4:05 PM  Result Value Ref Range   Glucose-Capillary 376 (H) 70 - 99 mg/dL  CBG monitoring, ED     Status: Abnormal   Collection Time: 09/29/23  5:00 PM  Result Value Ref Range   Glucose-Capillary 302 (H) 70 - 99 mg/dL  Beta-hydroxybutyric acid     Status: Abnormal   Collection Time: 09/29/23  5:57 PM  Result Value Ref Range   Beta-Hydroxybutyric Acid 2.88 (H) 0.05 - 0.27 mmol/L  TSH     Status: Abnormal   Collection Time: 09/29/23  5:57 PM  Result Value Ref Range   TSH 0.274 (L) 0.350 - 4.500 uIU/mL  Vitamin B12     Status: Abnormal   Collection Time: 09/29/23  5:57 PM  Result Value Ref Range   Vitamin B-12 1,035 (H) 180 - 914 pg/mL  Ammonia     Status: None   Collection Time: 09/29/23  5:57 PM  Result Value Ref Range   Ammonia 18 9 - 35 umol/L  HIV Antibody (routine testing w rflx)     Status: None   Collection Time: 09/29/23  5:57 PM  Result Value Ref Range   HIV Screen 4th Generation wRfx Non Reactive Non Reactive  Magnesium      Status: None   Collection Time: 09/29/23  5:57 PM  Result Value Ref Range   Magnesium  2.1 1.7 -  2.4 mg/dL  Glucose, capillary     Status: Abnormal   Collection Time: 09/29/23  5:57 PM  Result Value Ref Range   Glucose-Capillary 247 (H) 70 - 99 mg/dL   Comment 1 Notify RN    Comment 2 Document in Chart    Comment 3 Glucose Stabilizer   CK     Status: Abnormal   Collection Time: 09/29/23  6:02 PM  Result Value Ref Range   Total CK 938 (H) 38 - 234 U/L  Basic metabolic panel     Status: Abnormal   Collection Time: 09/29/23  6:02 PM  Result Value Ref Range   Sodium 161 (HH) 135 - 145 mmol/L   Potassium 3.2 (L) 3.5 - 5.1 mmol/L   Chloride 121 (H) 98 - 111 mmol/L   CO2 21 (L) 22 - 32 mmol/L   Glucose, Bld 306 (H) 70 - 99 mg/dL   BUN 34 (H) 8 - 23 mg/dL   Creatinine, Ser 8.81 (H) 0.44 - 1.00 mg/dL   Calcium 9.1 8.9 - 89.6 mg/dL   GFR, Estimated 53  (L) >60 mL/min   Anion gap 19 (H) 5 - 15  Troponin I (High Sensitivity)     Status: Abnormal   Collection Time: 09/29/23  6:02 PM  Result Value Ref Range   Troponin I (High Sensitivity) 259 (HH) <18 ng/L  Glucose, capillary     Status: Abnormal   Collection Time: 09/29/23  7:13 PM  Result Value Ref Range   Glucose-Capillary 210 (H) 70 - 99 mg/dL  Glucose, capillary     Status: Abnormal   Collection Time: 09/29/23  8:18 PM  Result Value Ref Range   Glucose-Capillary 162 (H) 70 - 99 mg/dL  Glucose, capillary     Status: Abnormal   Collection Time: 09/29/23  9:20 PM  Result Value Ref Range   Glucose-Capillary 164 (H) 70 - 99 mg/dL  Basic metabolic panel     Status: Abnormal   Collection Time: 09/29/23  9:43 PM  Result Value Ref Range   Sodium 160 (H) 135 - 145 mmol/L   Potassium 6.3 (HH) 3.5 - 5.1 mmol/L   Chloride 125 (H) 98 - 111 mmol/L   CO2 24 22 - 32 mmol/L   Glucose, Bld 173 (H) 70 - 99 mg/dL   BUN 35 (H) 8 - 23 mg/dL   Creatinine, Ser 9.08 0.44 - 1.00 mg/dL   Calcium 9.2 8.9 - 89.6 mg/dL   GFR, Estimated >39 >39 mL/min   Anion gap 11 5 - 15  Beta-hydroxybutyric acid     Status: Abnormal   Collection Time: 09/29/23  9:43 PM  Result Value Ref Range   Beta-Hydroxybutyric Acid 0.42 (H) 0.05 - 0.27 mmol/L  Troponin I (High Sensitivity)     Status: Abnormal   Collection Time: 09/29/23  9:43 PM  Result Value Ref Range   Troponin I (High Sensitivity) 221 (HH) <18 ng/L  Glucose, capillary     Status: Abnormal   Collection Time: 09/29/23 10:22 PM  Result Value Ref Range   Glucose-Capillary 142 (H) 70 - 99 mg/dL  Beta-hydroxybutyric acid     Status: None   Collection Time: 09/29/23 11:40 PM  Result Value Ref Range   Beta-Hydroxybutyric Acid 0.27 0.05 - 0.27 mmol/L  Basic metabolic panel with GFR     Status: Abnormal   Collection Time: 09/29/23 11:40 PM  Result Value Ref Range   Sodium 162 (HH) 135 - 145 mmol/L   Potassium 4.3 3.5 -  5.1 mmol/L   Chloride 124 (H) 98 - 111  mmol/L   CO2 26 22 - 32 mmol/L   Glucose, Bld 196 (H) 70 - 99 mg/dL   BUN 35 (H) 8 - 23 mg/dL   Creatinine, Ser 9.01 0.44 - 1.00 mg/dL   Calcium 9.2 8.9 - 89.6 mg/dL   GFR, Estimated >39 >39 mL/min   Anion gap 12 5 - 15  Glucose, capillary     Status: Abnormal   Collection Time: 09/30/23 12:19 AM  Result Value Ref Range   Glucose-Capillary 185 (H) 70 - 99 mg/dL  Beta-hydroxybutyric acid     Status: Abnormal   Collection Time: 09/30/23  2:40 AM  Result Value Ref Range   Beta-Hydroxybutyric Acid 0.36 (H) 0.05 - 0.27 mmol/L  Comprehensive metabolic panel     Status: Abnormal   Collection Time: 09/30/23  2:40 AM  Result Value Ref Range   Sodium 162 (HH) 135 - 145 mmol/L   Potassium 3.9 3.5 - 5.1 mmol/L   Chloride 123 (H) 98 - 111 mmol/L   CO2 26 22 - 32 mmol/L   Glucose, Bld 200 (H) 70 - 99 mg/dL   BUN 35 (H) 8 - 23 mg/dL   Creatinine, Ser 9.17 0.44 - 1.00 mg/dL   Calcium 9.2 8.9 - 89.6 mg/dL   Total Protein 5.7 (L) 6.5 - 8.1 g/dL   Albumin  2.9 (L) 3.5 - 5.0 g/dL   AST 36 15 - 41 U/L   ALT 48 (H) 0 - 44 U/L   Alkaline Phosphatase 67 38 - 126 U/L   Total Bilirubin 0.9 0.0 - 1.2 mg/dL   GFR, Estimated >39 >39 mL/min   Anion gap 13 5 - 15  CBC     Status: Abnormal   Collection Time: 09/30/23  2:40 AM  Result Value Ref Range   WBC 11.0 (H) 4.0 - 10.5 K/uL   RBC 5.30 (H) 3.87 - 5.11 MIL/uL   Hemoglobin 14.9 12.0 - 15.0 g/dL   HCT 49.1 (H) 63.9 - 53.9 %   MCV 95.8 80.0 - 100.0 fL   MCH 28.1 26.0 - 34.0 pg   MCHC 29.3 (L) 30.0 - 36.0 g/dL   RDW 86.9 88.4 - 84.4 %   Platelets 161 150 - 400 K/uL   nRBC 0.0 0.0 - 0.2 %  Glucose, capillary     Status: Abnormal   Collection Time: 09/30/23  4:03 AM  Result Value Ref Range   Glucose-Capillary 226 (H) 70 - 99 mg/dL  Basic metabolic panel     Status: Abnormal   Collection Time: 09/30/23  5:44 AM  Result Value Ref Range   Sodium 161 (HH) 135 - 145 mmol/L   Potassium 3.8 3.5 - 5.1 mmol/L   Chloride 122 (H) 98 - 111 mmol/L   CO2 28  22 - 32 mmol/L   Glucose, Bld 222 (H) 70 - 99 mg/dL   BUN 36 (H) 8 - 23 mg/dL   Creatinine, Ser 9.07 0.44 - 1.00 mg/dL   Calcium 9.0 8.9 - 89.6 mg/dL   GFR, Estimated >39 >39 mL/min   Anion gap 11 5 - 15  Beta-hydroxybutyric acid     Status: None   Collection Time: 09/30/23  5:44 AM  Result Value Ref Range   Beta-Hydroxybutyric Acid 0.14 0.05 - 0.27 mmol/L  Troponin I (High Sensitivity)     Status: Abnormal   Collection Time: 09/30/23  5:44 AM  Result Value Ref Range   Troponin I (High  Sensitivity) 251 (HH) <18 ng/L  Glucose, capillary     Status: Abnormal   Collection Time: 09/30/23  8:01 AM  Result Value Ref Range   Glucose-Capillary 163 (H) 70 - 99 mg/dL   Comment 1 Notify RN    Comment 2 Document in Chart     I have reviewed pertinent nursing notes, vitals, labs, and images as necessary. I have ordered labwork to follow up on as indicated.  I have reviewed the last notes from staff over past 24 hours. I have discussed patient's care plan and test results with nursing staff, CM/SW, and other staff as appropriate.  Time spent: Greater than 50% of the 55 minute visit was spent in counseling/coordination of care for the patient as laid out in the A&P.   LOS: 1 day   Alm Apo, MD [facility] 09/30/2023, 11:58 AM

## 2023-10-01 DIAGNOSIS — M6282 Rhabdomyolysis: Secondary | ICD-10-CM

## 2023-10-01 DIAGNOSIS — D649 Anemia, unspecified: Secondary | ICD-10-CM

## 2023-10-01 LAB — URINALYSIS, ROUTINE W REFLEX MICROSCOPIC
Bilirubin Urine: NEGATIVE
Glucose, UA: 150 mg/dL — AB
Hgb urine dipstick: NEGATIVE
Ketones, ur: NEGATIVE mg/dL
Leukocytes,Ua: NEGATIVE
Nitrite: POSITIVE — AB
Protein, ur: NEGATIVE mg/dL
Specific Gravity, Urine: 1.03 (ref 1.005–1.030)
pH: 5 (ref 5.0–8.0)

## 2023-10-01 LAB — CBC WITH DIFFERENTIAL/PLATELET
Abs Immature Granulocytes: 0.07 K/uL (ref 0.00–0.07)
Basophils Absolute: 0 K/uL (ref 0.0–0.1)
Basophils Relative: 0 %
Eosinophils Absolute: 0 K/uL (ref 0.0–0.5)
Eosinophils Relative: 0 %
HCT: 32.9 % — ABNORMAL LOW (ref 36.0–46.0)
Hemoglobin: 9.4 g/dL — ABNORMAL LOW (ref 12.0–15.0)
Immature Granulocytes: 1 %
Lymphocytes Relative: 20 %
Lymphs Abs: 1.7 K/uL (ref 0.7–4.0)
MCH: 28.5 pg (ref 26.0–34.0)
MCHC: 28.6 g/dL — ABNORMAL LOW (ref 30.0–36.0)
MCV: 99.7 fL (ref 80.0–100.0)
Monocytes Absolute: 0.5 K/uL (ref 0.1–1.0)
Monocytes Relative: 6 %
Neutro Abs: 6.1 K/uL (ref 1.7–7.7)
Neutrophils Relative %: 73 %
Platelets: 84 K/uL — ABNORMAL LOW (ref 150–400)
RBC: 3.3 MIL/uL — ABNORMAL LOW (ref 3.87–5.11)
RDW: 12.9 % (ref 11.5–15.5)
WBC: 8.3 K/uL (ref 4.0–10.5)
nRBC: 0 % (ref 0.0–0.2)

## 2023-10-01 LAB — GLUCOSE, CAPILLARY
Glucose-Capillary: 97 mg/dL (ref 70–99)
Glucose-Capillary: 237 mg/dL — ABNORMAL HIGH (ref 70–99)
Glucose-Capillary: 302 mg/dL — ABNORMAL HIGH (ref 70–99)
Glucose-Capillary: 299 mg/dL — ABNORMAL HIGH (ref 70–99)
Glucose-Capillary: 299 mg/dL — ABNORMAL HIGH (ref 70–99)
Glucose-Capillary: 296 mg/dL — ABNORMAL HIGH (ref 70–99)

## 2023-10-01 LAB — COMPREHENSIVE METABOLIC PANEL WITH GFR
ALT: 21 U/L (ref 0–44)
AST: 20 U/L (ref 15–41)
Albumin: <1.5 g/dL — ABNORMAL LOW (ref 3.5–5.0)
Alkaline Phosphatase: 34 U/L — ABNORMAL LOW (ref 38–126)
Anion gap: 15 (ref 5–15)
BUN: 21 mg/dL (ref 8–23)
CO2: 17 mmol/L — ABNORMAL LOW (ref 22–32)
Calcium: 7.4 mg/dL — ABNORMAL LOW (ref 8.9–10.3)
Chloride: 114 mmol/L — ABNORMAL HIGH (ref 98–111)
Creatinine, Ser: 0.32 mg/dL — ABNORMAL LOW (ref 0.44–1.00)
GFR, Estimated: >60 mL/min (ref >60)
Glucose, Bld: 111 mg/dL — ABNORMAL HIGH (ref 70–99)
Potassium: 3.8 mmol/L (ref 3.5–5.1)
Sodium: 146 mmol/L — ABNORMAL HIGH (ref 135–145)
Total Bilirubin: 0.7 mg/dL (ref 0.0–1.2)
Total Protein: 3 g/dL — ABNORMAL LOW (ref 6.5–8.1)

## 2023-10-01 LAB — MAGNESIUM: Magnesium: 1.1 mg/dL — ABNORMAL LOW (ref 1.7–2.4)

## 2023-10-01 LAB — RAPID URINE DRUG SCREEN, HOSP PERFORMED
Amphetamines: NOT DETECTED
Barbiturates: NOT DETECTED
Benzodiazepines: NOT DETECTED
Cocaine: NOT DETECTED
Opiates: NOT DETECTED
Tetrahydrocannabinol: NOT DETECTED

## 2023-10-01 LAB — CK: Total CK: 250 U/L — ABNORMAL HIGH (ref 38–234)

## 2023-10-01 LAB — FOLATE: Folate: 3 ng/mL — ABNORMAL LOW (ref >5.9)

## 2023-10-01 LAB — VITAMIN B12: Vitamin B-12: 390 pg/mL (ref 180–914)

## 2023-10-01 MED ORDER — FOLIC ACID 1 MG PO TABS
1.0000 mg | ORAL_TABLET | Freq: Every day | ORAL | Status: DC
  Administered 2023-10-02 – 2023-10-14 (×16): 1 mg via ORAL
  Filled 2023-10-01 (×13): qty 1

## 2023-10-01 MED ORDER — MEDIHONEY WOUND/BURN DRESSING EX PSTE
1.0000 | PASTE | Freq: Every day | CUTANEOUS | Status: DC
  Administered 2023-10-01 – 2023-10-14 (×15): 1 via TOPICAL
  Filled 2023-10-01 (×2): qty 44

## 2023-10-01 MED ORDER — ADULT MULTIVITAMIN W/MINERALS CH
1.0000 | ORAL_TABLET | Freq: Every day | ORAL | Status: DC
  Administered 2023-10-01 – 2023-10-14 (×17): 1 via ORAL
  Filled 2023-10-01 (×14): qty 1

## 2023-10-01 MED ORDER — VITAMIN B-12 1000 MCG PO TABS
1000.0000 ug | ORAL_TABLET | Freq: Every day | ORAL | Status: DC
  Administered 2023-10-01: 1000 ug
  Filled 2023-10-01: qty 1

## 2023-10-01 MED ORDER — INSULIN ASPART 100 UNIT/ML IJ SOLN
0.0000 [IU] | Freq: Three times a day (TID) | INTRAMUSCULAR | Status: DC
  Administered 2023-10-01: 5 [IU] via SUBCUTANEOUS
  Administered 2023-10-01: 3 [IU] via SUBCUTANEOUS

## 2023-10-01 MED ORDER — ALBUMIN HUMAN 25 % IV SOLN
12.5000 g | Freq: Once | INTRAVENOUS | Status: AC
  Administered 2023-10-01: 12.5 g via INTRAVENOUS
  Filled 2023-10-01: qty 50

## 2023-10-01 MED ORDER — MAGNESIUM SULFATE 4 GM/100ML IV SOLN
4.0000 g | Freq: Once | INTRAVENOUS | Status: AC
  Administered 2023-10-01: 4 g via INTRAVENOUS
  Filled 2023-10-01: qty 100

## 2023-10-01 MED ORDER — INSULIN ASPART 100 UNIT/ML IJ SOLN
0.0000 [IU] | Freq: Every day | INTRAMUSCULAR | Status: DC
  Administered 2023-10-01 – 2023-10-04 (×4): 3 [IU] via SUBCUTANEOUS
  Administered 2023-10-06: 2 [IU] via SUBCUTANEOUS
  Administered 2023-10-08 (×2): 3 [IU] via SUBCUTANEOUS

## 2023-10-01 MED ORDER — VITAMIN B-12 1000 MCG PO TABS
1000.0000 ug | ORAL_TABLET | Freq: Every day | ORAL | Status: DC
  Administered 2023-10-02 – 2023-10-14 (×16): 1000 ug via ORAL
  Filled 2023-10-01 (×13): qty 1

## 2023-10-01 MED ORDER — THIAMINE MONONITRATE 100 MG PO TABS
100.0000 mg | ORAL_TABLET | Freq: Every day | ORAL | Status: DC
  Administered 2023-10-01 – 2023-10-14 (×17): 100 mg via ORAL
  Filled 2023-10-01 (×14): qty 1

## 2023-10-01 MED ORDER — FOLIC ACID 1 MG PO TABS
1.0000 mg | ORAL_TABLET | Freq: Every day | ORAL | Status: DC
  Administered 2023-10-01: 1 mg
  Filled 2023-10-01: qty 1

## 2023-10-01 MED ORDER — INSULIN ASPART 100 UNIT/ML IJ SOLN
0.0000 [IU] | Freq: Three times a day (TID) | INTRAMUSCULAR | Status: DC
  Administered 2023-10-01: 8 [IU] via SUBCUTANEOUS
  Administered 2023-10-02 (×3): 5 [IU] via SUBCUTANEOUS
  Administered 2023-10-03: 3 [IU] via SUBCUTANEOUS
  Administered 2023-10-03: 5 [IU] via SUBCUTANEOUS
  Administered 2023-10-03: 8 [IU] via SUBCUTANEOUS
  Administered 2023-10-04: 5 [IU] via SUBCUTANEOUS
  Administered 2023-10-04: 8 [IU] via SUBCUTANEOUS
  Administered 2023-10-04: 3 [IU] via SUBCUTANEOUS
  Administered 2023-10-05 (×2): 5 [IU] via SUBCUTANEOUS
  Administered 2023-10-05 – 2023-10-06 (×2): 3 [IU] via SUBCUTANEOUS
  Administered 2023-10-06: 8 [IU] via SUBCUTANEOUS
  Administered 2023-10-06: 3 [IU] via SUBCUTANEOUS
  Administered 2023-10-07: 11 [IU] via SUBCUTANEOUS
  Administered 2023-10-07: 5 [IU] via SUBCUTANEOUS
  Administered 2023-10-07: 11 [IU] via SUBCUTANEOUS
  Administered 2023-10-07 (×2): 3 [IU] via SUBCUTANEOUS
  Administered 2023-10-07 – 2023-10-08 (×2): 5 [IU] via SUBCUTANEOUS
  Administered 2023-10-08: 8 [IU] via SUBCUTANEOUS
  Administered 2023-10-08: 3 [IU] via SUBCUTANEOUS
  Administered 2023-10-08: 8 [IU] via SUBCUTANEOUS
  Administered 2023-10-08: 3 [IU] via SUBCUTANEOUS
  Administered 2023-10-08: 5 [IU] via SUBCUTANEOUS
  Administered 2023-10-09 (×2): 2 [IU] via SUBCUTANEOUS
  Administered 2023-10-09: 3 [IU] via SUBCUTANEOUS
  Administered 2023-10-09: 8 [IU] via SUBCUTANEOUS
  Administered 2023-10-09: 3 [IU] via SUBCUTANEOUS
  Administered 2023-10-09: 8 [IU] via SUBCUTANEOUS
  Administered 2023-10-10: 2 [IU] via SUBCUTANEOUS
  Administered 2023-10-10: 11 [IU] via SUBCUTANEOUS
  Administered 2023-10-10: 2 [IU] via SUBCUTANEOUS
  Administered 2023-10-11: 3 [IU] via SUBCUTANEOUS
  Administered 2023-10-11: 5 [IU] via SUBCUTANEOUS
  Administered 2023-10-11: 3 [IU] via SUBCUTANEOUS
  Administered 2023-10-12: 5 [IU] via SUBCUTANEOUS
  Administered 2023-10-12: 2 [IU] via SUBCUTANEOUS
  Administered 2023-10-13: 8 [IU] via SUBCUTANEOUS
  Administered 2023-10-13: 5 [IU] via SUBCUTANEOUS
  Administered 2023-10-14: 3 [IU] via SUBCUTANEOUS
  Administered 2023-10-14: 5 [IU] via SUBCUTANEOUS
  Administered 2023-10-14: 3 [IU] via SUBCUTANEOUS

## 2023-10-01 MED ORDER — INSULIN GLARGINE-YFGN 100 UNIT/ML ~~LOC~~ SOLN
10.0000 [IU] | Freq: Every day | SUBCUTANEOUS | Status: DC
  Administered 2023-10-01 – 2023-10-14 (×17): 10 [IU] via SUBCUTANEOUS
  Filled 2023-10-01 (×14): qty 0.1

## 2023-10-01 NOTE — Assessment & Plan Note (Signed)
-   Initial CK9 138 on admission.  In setting of being found down on her porch -CK improved with fluids today, 250 - Continue fluids

## 2023-10-01 NOTE — Progress Notes (Addendum)
       Overnight   NAME: Suzanne Duke MRN: [id number] DOB : 22-Sep-1960    Date of Service   10/01/2023   HPI/Events of Note    Notified by RN for lab values, specifically Magnesium .   Latest Reference Range & Units 10/01/23 03:00  Sodium 135 - 145 mmol/L 146 (H)  Potassium 3.5 - 5.1 mmol/L 3.8  Chloride 98 - 111 mmol/L 114 (H)  CO2 22 - 32 mmol/L 17 (L)  Glucose 70 - 99 mg/dL 888 (H)  BUN 8 - 23 mg/dL 21  Creatinine 9.55 - 8.99 mg/dL 9.67 (L)  Calcium 8.9 - 10.3 mg/dL 7.4 (L)  Anion gap 5 - 15  15  Magnesium  1.7 - 2.4 mg/dL 1.1 (L)  Alkaline Phosphatase 38 - 126 U/L 34 (L)  Albumin  3.5 - 5.0 g/dL <8.4 (L)  AST 15 - 41 U/L 20  ALT 0 - 44 U/L 21  Total Protein 6.5 - 8.1 g/dL 3.0 (L)  (H): Data is abnormally high (L): Data is abnormally low     Interventions/ Plan   Magnesium  replacement ordered. Albumin  replacement. Continue previous Attending orders.      Lynwood Kipper BSN MSNA MSN ACNPC-AG Acute Care Nurse Practitioner [facility]

## 2023-10-01 NOTE — Inpatient Diabetes Management (Signed)
 Inpatient Diabetes Program Recommendations  AACE/ADA: New Consensus Statement on Inpatient Glycemic Control (2015)  Target Ranges:  Prepandial:   less than 140 mg/dL      Peak postprandial:   less than 180 mg/dL (1-2 hours)      Critically ill patients:  140 - 180 mg/dL   Lab Results  Component Value Date   GLUCAP 237 (H) 10/01/2023   HGBA1C 7.4 (H) 04/12/2021    Review of Glycemic Control  Latest Reference Range & Units 09/30/23 08:01 09/30/23 12:00 09/30/23 16:58 09/30/23 19:10 09/30/23 23:02 10/01/23 03:09 10/01/23 07:24  Glucose-Capillary 70 - 99 mg/dL 836 (H) 722 (H) 719 (H) 249 (H) 224 (H) 97 237 (H)  (H): Data is abnormally high Diabetes history: Type 2 DM Outpatient Diabetes medications: Metformin  500 mg BID Current orders for Inpatient glycemic control: Novolog  0-9 units TID & HS  Inpatient Diabetes Program Recommendations:    Consider adding Semglee  10 units at bedtime.   Thanks, Tinnie Minus, MSN, RNC-OB Diabetes Coordinator [phone] (8a-5p)

## 2023-10-01 NOTE — TOC Initial Note (Addendum)
 Transition of Care [facility]) - Initial/Assessment Note    Patient Details  Name: Suzanne Duke MRN: [id number] Date of Birth: 04/28/1960  [facility]) CM/SW Contact:    Jon ONEIDA Anon, RN Phone Number: 10/01/2023, 10:13 AM  Clinical Narrative:                 NCM met with pt at bedside. Pt is oriented to self and knows that she is in [place], [place]. Pt unable to tell me about her living situation but states she lives at home with her husband. No number is listed for husband. Pt states she has multiple siblings, but unable to give names or numbers for them. TOC consulted for SNF placement, as pt has no insurance this could be a barrier. PT consulted, awaiting recommendations.  Care Management will continue to follow.    Expected Discharge Plan:  (TBD) Barriers to Discharge: Continued Medical Work up   Patient Goals and CMS Choice Patient states their goals for this hospitalization and ongoing recovery are:: Pt states she is unsure of her goals when ready to discharge CMS Medicare.gov Compare Post Acute Care list provided to:: Other (Comment Required) (NA) Choice offered to / list presented to : NA [place] ownership interest in [facility].provided to:: Parent NA    Expected Discharge Plan and Services In-house Referral: Clinical Social Work Discharge Planning Services: CM Consult Post Acute Care Choice: NA Living arrangements for the past 2 months: Single Family Home                 DME Arranged: N/A DME Agency: NA       HH Arranged: NA HH Agency: NA        Prior Living Arrangements/Services Living arrangements for the past 2 months: Single Family Home Lives with:: Self Patient language and need for interpreter reviewed:: Yes Do you feel safe going back to the place where you live?:  (Pt unable to answer)      Need for Family Participation in Patient Care: Yes (Comment) Care giver support system in place?: No (comment) Current home services:  Other (comment) (NA) Criminal Activity/Legal Involvement Pertinent to Current Situation/Hospitalization: No - Comment as needed  Activities of Daily Living      Permission Sought/Granted Permission sought to share information with : Case Manager Permission granted to share information with : Yes, Verbal Permission Granted  Share Information with NAME: Case Manager           Emotional Assessment Appearance:: Appears older than stated age Attitude/Demeanor/Rapport: Inconsistent Affect (typically observed): Calm Orientation: : Oriented to Self Alcohol / Substance Use: Not Applicable Psych Involvement: No (comment)  Admission diagnosis:  Hypernatremia [E87.0] DKA, type 2 (HCC) [E11.10] Diabetic ketoacidosis without coma associated with type 2 diabetes mellitus (HCC) [E11.10] Patient Active Problem List   Diagnosis Date Noted   Elevated troponin 09/30/2023   Anxiety and depression 09/29/2023   Hypernatremia 09/29/2023   Acute metabolic encephalopathy 09/29/2023   Unsatisfactory living conditions 09/29/2023   Reactive airway disease 09/18/2018   Liver disorder 02/02/2014   NASH (nonalcoholic steatohepatitis) 11/28/2013   Arthralgia 08/27/2011   Left hand pain 08/27/2011   Encounter for long-term (current) use of other medications 08/27/2011   GERD 05/11/2010   BARIATRIC SURGERY STATUS 06/01/2009   Diabetes (HCC) 07/17/2007   ANEMIA-IRON DEFICIENCY 02/17/2007   ANXIETY 02/17/2007   DEPRESSION 02/17/2007   HICCUPS 02/17/2007   LUMBAR RADICULOPATHY 01/13/2007   GOITER, NONTOXIC MULTINODULAR 01/07/2007   UNSPECIFIED  NEURALGIA NEURITIS AND RADICULITIS 01/07/2007   Essential hypertension 09/21/2006   PCP:  Levora Reyes SAUNDERS, MD Pharmacy:   [facility] [phone] [place], [place] - [phone] AT [facility] [address] [place] [address] Phone: [phone] Fax: [phone]  [facility] - [place], [place] - [address] [place] [address] Phone: [phone] Fax: [phone]  [place] [facility] [address] [place] [address] Phone: [phone] Fax: [phone]     Social Drivers of Health (SDOH) Social History: SDOH Screenings   Food Insecurity: Patient Unable To Answer (09/30/2023)  Transportation Needs: Patient Unable To Answer (09/30/2023)  Depression (PHQ2-9): Low Risk  (04/12/2021)  Tobacco Use: High Risk (09/29/2023)   SDOH Interventions:     Readmission Risk Interventions    10/01/2023   10:07 AM  Readmission Risk Prevention Plan  Post Dischage Appt Complete  Medication Screening Complete  Transportation Screening Complete

## 2023-10-01 NOTE — Plan of Care (Signed)
  Problem: Coping: Goal: Ability to adjust to condition or change in health will improve Outcome: Not Progressing   Problem: Fluid Volume: Goal: Ability to maintain a balanced intake and output will improve Outcome: Not Progressing   Problem: Health Behavior/Discharge Planning: Goal: Ability to identify and utilize available resources and services will improve Outcome: Not Progressing Goal: Ability to manage health-related needs will improve Outcome: Not Progressing   Problem: Nutritional: Goal: Maintenance of adequate nutrition will improve Outcome: Not Progressing Goal: Progress toward achieving an optimal weight will improve Outcome: Not Progressing   Problem: Skin Integrity: Goal: Risk for impaired skin integrity will decrease Outcome: Not Progressing   Problem: Health Behavior/Discharge Planning: Goal: Ability to manage health-related needs will improve Outcome: Not Progressing   Problem: Nutritional: Goal: Maintenance of adequate nutrition will improve Outcome: Not Progressing Goal: Maintenance of adequate weight for body size and type will improve Outcome: Not Progressing

## 2023-10-01 NOTE — TOC Progression Note (Signed)
 Transition of Care [facility]) - Progression Note   Patient Details  Name: Suzanne Duke MRN: [id number] Date of Birth: Apr 02, 1960  Transition of [facility]) CM/SW Contact  [name], LCSW Phone Number: 10/01/2023, 2:56 PM  Clinical Narrative: CSW followed up with patient's assigned APS case worker, Jon Marina, to follow up regarding discharge planning as patient remains oriented to self only. Per Ms. Marina, patient is in the evaluation stage while APS attempts to locate any family so guardianship is not being pursued at this time. CSW provided Ms. Robertson with contact information. Care management to follow.  Expected Discharge Plan:  (TBD) Barriers to Discharge: Continued Medical Work up  Expected Discharge Plan and Services In-house Referral: Clinical Social Work Discharge Planning Services: CM Consult Post Acute Care Choice: NA Living arrangements for the past 2 months: Single Family Home              DME Arranged: N/A DME Agency: NA HH Arranged: NA HH Agency: NA  Social Drivers of Health (SDOH) Interventions SDOH Screenings   Food Insecurity: Patient Unable To Answer (09/30/2023)  Transportation Needs: Patient Unable To Answer (09/30/2023)  Depression (PHQ2-9): Low Risk  (04/12/2021)  Tobacco Use: High Risk (09/29/2023)   Readmission Risk Interventions    10/01/2023   10:07 AM  Readmission Risk Prevention Plan  Post Dischage Appt Complete  Medication Screening Complete  Transportation Screening Complete

## 2023-10-01 NOTE — Progress Notes (Signed)
 Progress Note    Suzanne Duke   FMW:[id number]  DOB: 27-Mar-1960  DOA: 09/29/2023     2 PCP: Levora Reyes [name], MD  Initial CC: Found down  Hospital Course: Suzanne Duke is a 63 year old female with PMH DM II, HTN, GERD, anxiety/depression, bariatric surgery, IDA, NASH, thyroid  goiter who presented after being found down on her porch for an unknown amount of time.  She was seen by a Dentist on the porch and EMS was called.  Upon arrival she was found to be soiled in urine and with insects/bugs on her. Patient was grossly confused and unable to provide any collateral information on admission. It was reported that her husband moved out a few months prior due to change in her behavior and aggression. She seems to deny any suicidal ideation or intent. On workup, she was found to be in DKA and started on fluids and insulin  drip.  She was also found to have significant hypernatremia in the setting of volume depletion/dehydration.  Interval History:  Still very confused this morning and only oriented to her name.  Almost appears to have a dementia like process.  Pleasantly confused. Called number listed for her husband Suzanne Duke [phone] ) from prior psychiatry note but no answer and unable to leave voicemail.  Assessment and Plan: * DKA, type 2 (HCC)-resolved as of 09/30/2023 - unclear cause, but for now sounds like volume depletion and down on the floor u/k amount of time and missed meds, etc - responded well to insulin  gtt and IVF on admission; now transitioned to SSI; will resume on a diet too and see how tolerates   Hypernatremia - Sodium of 160 on presentation, but higher with glucose of 450  - Has undergone fluid resuscitation on admission and now diet being resumed -Will continue trending sodium as needed; should improve with mentation improving and oral intake improving - Improved with fluids; continue LR 1 more day  Normocytic anemia - Suspect she was very  hemoconcentrated on admission in setting of severe dehydration -All cell lines have diluted with fluids -Prior hemoglobin baseline possibly closer to around 12 g/dL -No evidence of bleeding at this time - Folate low, 3 and starting on replacement -B12 level low/normal 390 and will start replacement as well  Acute metabolic encephalopathy - Unclear what her baseline is and no family able to provide collateral although husband did call yesterday and spoke to the hospital briefly but was unable to be given much information since he was not on the chart - She provided a number for Suzanne Duke her son, but H&P also says she was having hallucination of seeing her deceased son; Left a VM on the number she gave in case - will treat medical issues and reverse as much as possible then likely needs psych eval at that point - she's oriented to her name and knows she's in a hospital. Couldn't answer much else orientation wise, but does easily follow commands and no obvious neuro deficits - MRI brain unremarkable, generalized parenchymal loss - Ammonia normal -Remains grossly confused on 10/01/2023 almost like a dementia patient.  Will continue treating metabolic issues and watching mentation change; also will try to contact husband and clarify her baseline - Start on thiamine  and check B1 level  Rhabdomyolysis - Initial CK9 138 on admission.  In setting of being found down on her porch -CK improved with fluids today, 250 - Continue fluids  Unsatisfactory living conditions Per nursing patient at home with no  power/clean water Worsening mental decline? - will need TOC assistance once more medically stable  Essential hypertension On no medication for months  Blood pressure has been normotensive to mildly elevated  GOITER, NONTOXIC MULTINODULAR Prior treatment by endocrinology with stable ultrasound previously.  TSH wnl 03/2021  - TSH suppressed, 0.274 - check FT4 and TT3  Elevated troponin -  Suspecting demand ischemia in setting of being found down and significantly dehydrated with DKA -No reports of chest pain -EKG looks like sinus rhythm with a lot of artifact; it was read as A-fib but does not appear to be real; no obvious signs of ischemia appreciated either -Troponin trend also flat  Hyperkalemia-resolved as of 09/30/2023 - Now normalized    Old records reviewed in assessment of this patient  Antimicrobials:   DVT prophylaxis:  SCDs Start: 09/29/23 1659   Code Status:   Code Status: Full Code  Mobility Assessment (Last 72 Hours)     Mobility Assessment     Row Name 10/01/23 0800 09/30/23 2000 09/30/23 0800 09/30/23 0000 09/29/23 1800   Does the patient have exclusion criteria? No - Perform mobility assessment No - Perform mobility assessment No - Perform mobility assessment No - Perform mobility assessment No - Perform mobility assessment   What is the highest level of mobility based on the mobility assessment? Level 2 (Chairfast) - Balance while sitting on edge of bed and cannot stand Level 1 (Bedfast) - Unable to balance while sitting on edge of bed Level 1 (Bedfast) - Unable to balance while sitting on edge of bed Level 1 (Bedfast) - Unable to balance while sitting on edge of bed Level 1 (Bedfast) - Unable to balance while sitting on edge of bed   Is the above level different from baseline mobility prior to current illness? Yes - Recommend PT order Yes - Recommend PT order Yes - Recommend PT order Yes - Recommend PT order Yes - Recommend PT order      Barriers to discharge: None Disposition Plan: TBD HH orders placed: TBD Status is: Inpatient  Objective: Blood pressure (!) 165/68, pulse 60, temperature 97.8 F (36.6 C), temperature source Oral, resp. rate 11, height 5' 7 (1.702 m), weight 79.8 kg, SpO2 97%.  Examination:  Physical Exam Constitutional:      Comments: Adult woman resting in bed in no distress appearing confused  HENT:     Head:  Normocephalic and atraumatic.     Mouth/Throat:     Mouth: Mucous membranes are moist.  Eyes:     Extraocular Movements: Extraocular movements intact.  Cardiovascular:     Rate and Rhythm: Normal rate and regular rhythm.  Pulmonary:     Effort: Pulmonary effort is normal. No respiratory distress.     Breath sounds: Normal breath sounds. No wheezing.  Abdominal:     General: Bowel sounds are normal. There is no distension.     Palpations: Abdomen is soft.     Tenderness: There is no abdominal tenderness.  Musculoskeletal:        General: Normal range of motion.     Cervical back: Normal range of motion and neck supple.  Skin:    General: Skin is warm and dry.  Neurological:     Comments: Oriented only to name.  After coaching was able to state she was in a hospital but did not know the name.  Unable to recall year or president.  Follows all commands easily and no focal neurodeficits otherwise  Consultants:    Procedures:    Data Reviewed: Results for orders placed or performed during the hospital encounter of 09/29/23 (from the past 24 hours)  Glucose, capillary     Status: Abnormal   Collection Time: 09/30/23  4:58 PM  Result Value Ref Range   Glucose-Capillary 280 (H) 70 - 99 mg/dL   Comment 1 Notify RN    Comment 2 Document in Chart   Glucose, capillary     Status: Abnormal   Collection Time: 09/30/23  7:10 PM  Result Value Ref Range   Glucose-Capillary 249 (H) 70 - 99 mg/dL   Comment 1 Notify RN    Comment 2 Document in Chart   Glucose, capillary     Status: Abnormal   Collection Time: 09/30/23 11:02 PM  Result Value Ref Range   Glucose-Capillary 224 (H) 70 - 99 mg/dL  Urinalysis, Routine w reflex microscopic -Urine, Clean Catch     Status: Abnormal   Collection Time: 10/01/23  2:39 AM  Result Value Ref Range   Color, Urine AMBER (A) YELLOW   APPearance HAZY (A) CLEAR   Specific Gravity, Urine 1.030 1.005 - 1.030   pH 5.0 5.0 - 8.0   Glucose, UA 150 (A)  NEGATIVE mg/dL   Hgb urine dipstick NEGATIVE NEGATIVE   Bilirubin Urine NEGATIVE NEGATIVE   Ketones, ur NEGATIVE NEGATIVE mg/dL   Protein, ur NEGATIVE NEGATIVE mg/dL   Nitrite POSITIVE (A) NEGATIVE   Leukocytes,Ua NEGATIVE NEGATIVE   RBC / HPF 0-5 0 - 5 RBC/hpf   WBC, UA 0-5 0 - 5 WBC/hpf   Bacteria, UA MANY (A) NONE SEEN   Squamous Epithelial / HPF 0-5 0 - 5 /HPF   Mucus PRESENT   Rapid urine drug screen (hospital performed)     Status: None   Collection Time: 10/01/23  2:40 AM  Result Value Ref Range   Opiates NONE DETECTED NONE DETECTED   Cocaine NONE DETECTED NONE DETECTED   Benzodiazepines NONE DETECTED NONE DETECTED   Amphetamines NONE DETECTED NONE DETECTED   Tetrahydrocannabinol NONE DETECTED NONE DETECTED   Barbiturates NONE DETECTED NONE DETECTED  Comprehensive metabolic panel with GFR     Status: Abnormal   Collection Time: 10/01/23  3:00 AM  Result Value Ref Range   Sodium 146 (H) 135 - 145 mmol/L   Potassium 3.8 3.5 - 5.1 mmol/L   Chloride 114 (H) 98 - 111 mmol/L   CO2 17 (L) 22 - 32 mmol/L   Glucose, Bld 111 (H) 70 - 99 mg/dL   BUN 21 8 - 23 mg/dL   Creatinine, Ser 9.67 (L) 0.44 - 1.00 mg/dL   Calcium 7.4 (L) 8.9 - 10.3 mg/dL   Total Protein 3.0 (L) 6.5 - 8.1 g/dL   Albumin  <1.5 (L) 3.5 - 5.0 g/dL   AST 20 15 - 41 U/L   ALT 21 0 - 44 U/L   Alkaline Phosphatase 34 (L) 38 - 126 U/L   Total Bilirubin 0.7 0.0 - 1.2 mg/dL   GFR, Estimated >39 >39 mL/min   Anion gap 15 5 - 15  Magnesium      Status: Abnormal   Collection Time: 10/01/23  3:00 AM  Result Value Ref Range   Magnesium  1.1 (L) 1.7 - 2.4 mg/dL  CK     Status: Abnormal   Collection Time: 10/01/23  3:00 AM  Result Value Ref Range   Total CK 250 (H) 38 - 234 U/L  Glucose, capillary     Status: None  Collection Time: 10/01/23  3:09 AM  Result Value Ref Range   Glucose-Capillary 97 70 - 99 mg/dL  Glucose, capillary     Status: Abnormal   Collection Time: 10/01/23  7:24 AM  Result Value Ref Range    Glucose-Capillary 237 (H) 70 - 99 mg/dL  CBC with Differential/Platelet     Status: Abnormal   Collection Time: 10/01/23  8:00 AM  Result Value Ref Range   WBC 8.3 4.0 - 10.5 K/uL   RBC 3.30 (L) 3.87 - 5.11 MIL/uL   Hemoglobin 9.4 (L) 12.0 - 15.0 g/dL   HCT 67.0 (L) 63.9 - 53.9 %   MCV 99.7 80.0 - 100.0 fL   MCH 28.5 26.0 - 34.0 pg   MCHC 28.6 (L) 30.0 - 36.0 g/dL   RDW 87.0 88.4 - 84.4 %   Platelets 84 (L) 150 - 400 K/uL   nRBC 0.0 0.0 - 0.2 %   Neutrophils Relative % 73 %   Neutro Abs 6.1 1.7 - 7.7 K/uL   Lymphocytes Relative 20 %   Lymphs Abs 1.7 0.7 - 4.0 K/uL   Monocytes Relative 6 %   Monocytes Absolute 0.5 0.1 - 1.0 K/uL   Eosinophils Relative 0 %   Eosinophils Absolute 0.0 0.0 - 0.5 K/uL   Basophils Relative 0 %   Basophils Absolute 0.0 0.0 - 0.1 K/uL   Immature Granulocytes 1 %   Abs Immature Granulocytes 0.07 0.00 - 0.07 K/uL   Acanthocytes PRESENT   Folate     Status: Abnormal   Collection Time: 10/01/23  8:00 AM  Result Value Ref Range   Folate 3.0 (L) >5.9 ng/mL  Vitamin B12     Status: None   Collection Time: 10/01/23  8:00 AM  Result Value Ref Range   Vitamin B-12 390 180 - 914 pg/mL  Glucose, capillary     Status: Abnormal   Collection Time: 10/01/23 11:02 AM  Result Value Ref Range   Glucose-Capillary 302 (H) 70 - 99 mg/dL  Glucose, capillary     Status: Abnormal   Collection Time: 10/01/23 11:50 AM  Result Value Ref Range   Glucose-Capillary 299 (H) 70 - 99 mg/dL    I have reviewed pertinent nursing notes, vitals, labs, and images as necessary. I have ordered labwork to follow up on as indicated.  I have reviewed the last notes from staff over past 24 hours. I have discussed patient's care plan and test results with nursing staff, CM/SW, and other staff as appropriate.  Time spent: Greater than 50% of the 55 minute visit was spent in counseling/coordination of care for the patient as laid out in the A&P.   LOS: 2 days   Alm Apo, MD [facility] 10/01/2023, 1:06 PM

## 2023-10-01 NOTE — Evaluation (Signed)
 Physical Therapy Evaluation Patient Details Name: Suzanne Duke MRN: [id number] DOB: 10-15-1960 Today's Date: 10/01/2023  History of Present Illness  Patient is a 63 y/o female admitted 09/29/23 from home after spouse found her down on her porch soiled in urine and bugs crawling on her. Found to be dehydrated, confused, in DKA and with hypernatremia.  PMH positive for HNT, DM, GERD, anxiety, depression, s/p bariatric surgery, IDA, HASH, thyroid  goiter.  Clinical Impression  Patient presents with decreased mobility due to generalized weakness, decreased balance, decreased cognition and decreased safety awareness. Patient previously living alone though not managing medication, bills, with home in poor condition per chart.  No family present for complete history.  She was able to stand with max A over multiple trials and ambulated with mod A with RW though very high risk for falls with poor safety and deficit awareness.  She will benefit from skilled PT in the acute setting and will need post-acute inpatient rehab (<3 hours/day) at d/c.         If plan is discharge home, recommend the following: A lot of help with bathing/dressing/bathroom;A lot of help with walking and/or transfers;Assistance with cooking/housework;Assist for transportation;Help with stairs or ramp for entrance   Can travel by private vehicle   No    Equipment Recommendations Rolling walker (2 wheels)  Recommendations for Other Services       Functional Status Assessment Patient has had a recent decline in their functional status and demonstrates the ability to make significant improvements in function in a reasonable and predictable amount of time.     Precautions / Restrictions Precautions Precautions: Fall Recall of Precautions/Restrictions: Impaired      Mobility  Bed Mobility Overal bed mobility: Needs Assistance Bed Mobility: Supine to Sit, Sit to Supine     Supine to sit: Used rails, HOB elevated, Contact  guard Sit to supine: Mod assist   General bed mobility comments: pulls up on rails and scoots to EOB unaided, CGA for balance, to supine assist for lifting legs onto bed    Transfers Overall transfer level: Needs assistance Equipment used: Rolling walker (2 wheels) Transfers: Sit to/from Stand Sit to Stand: From elevated surface, Max assist           General transfer comment: increased time, two to three attempts to stand with heavy lifting help from EOB then from chair in hallway    Ambulation/Gait Ambulation/Gait assistance: Min assist, Mod assist Gait Distance (Feet): 45 Feet (x 2) Assistive device: Rolling walker (2 wheels) Gait Pattern/deviations: Step-through pattern, Step-to pattern, Wide base of support, Decreased stride length       General Gait Details: cues for keeping walker close, keeping feet inside walker and for posture, assist for safety and balance  Stairs            Wheelchair Mobility     Tilt Bed    Modified Rankin (Stroke Patients Only)       Balance Overall balance assessment: Needs assistance   Sitting balance-Leahy Scale: Good Sitting balance - Comments: able to scoot up in bed in long sitting                                     Pertinent Vitals/Pain Pain Assessment Pain Assessment: No/denies pain    Home Living Family/patient expects to be discharged to:: Private residence Living Arrangements: Alone   Type of Home: House Home Access:  Level entry       Home Layout: One level Home Equipment: None Additional Comments: states one level home without steps, though pt poor historian, no family present    Prior Function Prior Level of Function : Independent/Modified Independent                     Extremity/Trunk Assessment   Upper Extremity Assessment Upper Extremity Assessment: Defer to OT evaluation    Lower Extremity Assessment Lower Extremity Assessment: RLE deficits/detail;LLE  deficits/detail RLE Deficits / Details: AAROM WFL, strength hip flexion 2/5, knee extension 3+/5 LLE Deficits / Details: AAROM WFL, strength hip flexion 2/5, knee extension 3+/5       Communication   Communication Communication: No apparent difficulties    Cognition Arousal: Alert Behavior During Therapy: WFL for tasks assessed/performed   PT - Cognitive impairments: Orientation, Memory, Problem solving, Safety/Judgement   Orientation impairments: Place, Time, Situation                   PT - Cognition Comments: slow processing, trying to get up prior to PT entry Following commands: Intact       Cueing Cueing Techniques: Verbal cues     General Comments General comments (skin integrity, edema, etc.): HR mid 90-100's    Exercises     Assessment/Plan    PT Assessment Patient needs continued PT services  PT Problem List Decreased strength;Decreased balance;Decreased mobility;Decreased cognition;Decreased knowledge of use of DME;Decreased safety awareness       PT Treatment Interventions DME instruction;Gait training;Patient/family education;Functional mobility training;Therapeutic activities;Therapeutic exercise;Balance training    PT Goals (Current goals can be found in the Care Plan section)  Acute Rehab PT Goals Patient Stated Goal: return home PT Goal Formulation: Patient unable to participate in goal setting Time For Goal Achievement: 10/15/23 Potential to Achieve Goals: Fair    Frequency Min 2X/week     Co-evaluation               AM-PAC PT 6 Clicks Mobility  Outcome Measure Help needed turning from your back to your side while in a flat bed without using bedrails?: A Little Help needed moving from lying on your back to sitting on the side of a flat bed without using bedrails?: A Little Help needed moving to and from a bed to a chair (including a wheelchair)?: A Lot Help needed standing up from a chair using your arms (e.g., wheelchair or  bedside chair)?: Total Help needed to walk in hospital room?: A Lot Help needed climbing 3-5 steps with a railing? : Total 6 Click Score: 12    End of Session Equipment Utilized During Treatment: Gait belt Activity Tolerance: Patient tolerated treatment well Patient left: in bed;with call bell/phone within reach;with bed alarm set Nurse Communication: Mobility status PT Visit Diagnosis: Other abnormalities of gait and mobility (R26.89);Muscle weakness (generalized) (M62.81);History of falling (Z91.81);Other symptoms and signs involving the nervous system (R29.898)    Time: 8469-8446 PT Time Calculation (min) (ACUTE ONLY): 23 min   Charges:   PT Evaluation $PT Eval Moderate Complexity: 1 Mod PT Treatments $Gait Training: 8-22 mins PT General Charges $$ ACUTE PT VISIT: 1 Visit         Micheline Portal, PT Acute Rehabilitation Services [phone] 10/01/2023   Montie Portal 10/01/2023, 4:50 PM

## 2023-10-01 NOTE — Assessment & Plan Note (Signed)
-   Suspect she was very hemoconcentrated on admission in setting of severe dehydration -All cell lines have diluted with fluids -Prior hemoglobin baseline possibly closer to around 12 g/dL -No evidence of bleeding at this time - Folate low, 3 and starting on replacement -B12 level low/normal 390 and will start replacement as well

## 2023-10-01 NOTE — Consult Note (Addendum)
 WOC Nurse Consult Note: patient found down at home  Reason for Consult:  R hip wound  Wound type: Deep Tissue Pressure Injury that is evolving  Pressure Injury POA: Yes Measurement: see nursing flowsheet  Wound azi:ijmx discoloration that is evolving to unstageable with tan necrotic tissue  Drainage (amount, consistency, odor) see nursing flowsheet  Periwound:  Dressing procedure/placement/frequency: Cleanse R hip wound with Vashe wound cleanser Soila [phone]) do not rinse and allow to air dry. Apply Medihoney to open necrotic wound beds daily, cover with dry gauze.  Cover remaining blistering dark skin with Xeroform gauze Soila [phone]).  Cover with silicone foam or ABD pad and tape whichever is preferred.   POC discussed with bedside nurse. WOC team will not follow.  DEEP TISSUE PRESSURE INJURIES ARE HIGH RISK FOR DETERIORATION.  RECONSULT WOC IF FURTHER NEEDS ARISE.   Thank you,    Powell Bar MSN, RN-BC, [employer]

## 2023-10-02 LAB — BASIC METABOLIC PANEL WITH GFR
Anion gap: 8 (ref 5–15)
BUN: 16 mg/dL (ref 8–23)
CO2: 27 mmol/L (ref 22–32)
Calcium: 7.7 mg/dL — ABNORMAL LOW (ref 8.9–10.3)
Chloride: 106 mmol/L (ref 98–111)
Creatinine, Ser: 0.54 mg/dL (ref 0.44–1.00)
GFR, Estimated: >60 mL/min (ref >60)
Glucose, Bld: 179 mg/dL — ABNORMAL HIGH (ref 70–99)
Potassium: 3.4 mmol/L — ABNORMAL LOW (ref 3.5–5.1)
Sodium: 141 mmol/L (ref 135–145)

## 2023-10-02 LAB — GLUCOSE, CAPILLARY
Glucose-Capillary: 208 mg/dL — ABNORMAL HIGH (ref 70–99)
Glucose-Capillary: 221 mg/dL — ABNORMAL HIGH (ref 70–99)
Glucose-Capillary: 201 mg/dL — ABNORMAL HIGH (ref 70–99)
Glucose-Capillary: 190 mg/dL — ABNORMAL HIGH (ref 70–99)

## 2023-10-02 LAB — CBC WITH DIFFERENTIAL/PLATELET
Abs Immature Granulocytes: 0.06 K/uL (ref 0.00–0.07)
Basophils Absolute: 0 K/uL (ref 0.0–0.1)
Basophils Relative: 0 %
Eosinophils Absolute: 0 K/uL (ref 0.0–0.5)
Eosinophils Relative: 1 %
HCT: 40.2 % (ref 36.0–46.0)
Hemoglobin: 11.6 g/dL — ABNORMAL LOW (ref 12.0–15.0)
Immature Granulocytes: 1 %
Lymphocytes Relative: 21 %
Lymphs Abs: 1.7 K/uL (ref 0.7–4.0)
MCH: 27.2 pg (ref 26.0–34.0)
MCHC: 28.9 g/dL — ABNORMAL LOW (ref 30.0–36.0)
MCV: 94.1 fL (ref 80.0–100.0)
Monocytes Absolute: 0.3 K/uL (ref 0.1–1.0)
Monocytes Relative: 4 %
Neutro Abs: 5.8 K/uL (ref 1.7–7.7)
Neutrophils Relative %: 73 %
Platelets: 105 K/uL — ABNORMAL LOW (ref 150–400)
RBC: 4.27 MIL/uL (ref 3.87–5.11)
RDW: 12.6 % (ref 11.5–15.5)
WBC: 7.9 K/uL (ref 4.0–10.5)
nRBC: 0 % (ref 0.0–0.2)

## 2023-10-02 LAB — T3: T3, Total: 73 ng/dL (ref 71–180)

## 2023-10-02 LAB — MAGNESIUM: Magnesium: 1.8 mg/dL (ref 1.7–2.4)

## 2023-10-02 MED ORDER — POTASSIUM CHLORIDE CRYS ER 20 MEQ PO TBCR
40.0000 meq | EXTENDED_RELEASE_TABLET | Freq: Once | ORAL | Status: AC
  Administered 2023-10-02: 40 meq via ORAL
  Filled 2023-10-02: qty 2

## 2023-10-02 MED ORDER — ENSURE PLUS HIGH PROTEIN PO LIQD
237.0000 mL | Freq: Two times a day (BID) | ORAL | Status: DC
  Administered 2023-10-03 – 2023-10-14 (×23): 237 mL via ORAL

## 2023-10-02 NOTE — Progress Notes (Signed)
 Mobility Specialist - Progress Note   10/02/23 1519  Mobility  Activity Ambulated with assistance  Level of Assistance Moderate assist, patient does 50-74%  Assistive Device Front wheel walker  Distance Ambulated (ft) 80 ft  Range of Motion/Exercises Active  Activity Response Tolerated well  Mobility Referral Yes  Mobility visit 1 Mobility  Mobility Specialist Start Time (ACUTE ONLY) 1500  Mobility Specialist Stop Time (ACUTE ONLY) 1519  Mobility Specialist Time Calculation (min) (ACUTE ONLY) 19 min   Pt was found was found on recliner chair and agreeable to ambulate. Had x1 LOB due to over leaning to R side. Ambulated 5ft x 2 with seated rest break in between. At EOS returned to recliner chair with all needs met. Call bell in reach and NT in room.  Erminio Leos,  Mobility Specialist Can be reached via Secure Chat

## 2023-10-02 NOTE — Progress Notes (Signed)
 PROGRESS NOTE  Suzanne Duke FMW:[id number] DOB: 1960/05/28 DOA: 09/29/2023 PCP: Levora Reyes [name], MD   LOS: 3 days   Brief narrative:  [name] is a 63 year old female with past medical history of DM II, HTN, GERD, anxiety/depression, bariatric surgery, IDA, NASH, thyroid  goiter presented to hospital from home after being found on her porch for unknown amount of time.  She was seen by her neighbor and EMS was called thin.  She was noted to be swelling urine and in 6 [place].  In the ED patient was grossly confused.  It was reported that her husband moved out a few months prior due to change in her behavior and aggression.  Patient was noted to be in DKA and was started on IV fluids and insulin  drip.  She was also noted to have significant hypernatremia in the setting of volume depletion and dehydration.   Assessment/Plan: Active Problems:   Hypernatremia   Acute metabolic encephalopathy   Normocytic anemia   Unsatisfactory living conditions   Rhabdomyolysis   Essential hypertension   GOITER, NONTOXIC MULTINODULAR   Elevated troponin  DKA, type 2 with mild hyperglycemia Resolved after insulin  drip.  Currently on basal insulin /sliding scale insulin , diabetic diet.  Will continue to monitor closely.    Hypernatremia Received IV fluid hydration with improvement of sodium to 141 at this time.  Still on LR 100 mL/h.  Check BMP in AM.   Normocytic anemia Hemoglobin today at 11.6.  Initially hemodiluted. Folate low, at 3 and starting on folic acid  supplement. B12 level low/normal 390 continue vitamin B12 supplementation.  Acute metabolic encephalopathy with possible underlying dementia. - Unclear what her baseline is and no family able to provide collateral information. MRI of the brain with parenchymal loss.  Continue thiamine .  Check vitamin B1 level. ammonia within normal limits.  Might benefit from psych evaluation.  Was on safety observation.  Patient oriented to self.    Rhabdomyolysis - Initial CK9 938 on admission.  On IV fluids.  Latest CK level of 250.  Unsatisfactory living conditions Continue TOC assistance.   Essential hypertension Not on medications for months now.   Nontoxic multinodular goiter. Prior treatment by endocrinology with stable ultrasound previously.  TSH  suppressed, 0.274.  Free T4 at 1.0.   Elevated troponin Flat troponins.  No chest pain.  EKG with sinus rhythm.  Hyperkalemia followed by mild hypokalemia at this time.  Will replace with oral potassium.   DVT prophylaxis: SCDs Start: 09/29/23 1659   Disposition: Pending placement.  Status is: Inpatient Remains inpatient appropriate because: Pending placement, safe disposition    Code Status:     Code Status: Full Code  Family Communication: None available  Consultants: None  Procedures: None  Anti-infectives:  None  Anti-infectives (From admission, onward)    None        Subjective: Today, patient was seen and examined at bedside.  Patient stated that she feels okay.  Denies any nausea vomiting fever chills or rigor.  Does not remember what happened to her.  She thinks that her husband is visiting and calling her.  Objective: Vitals:   10/01/23 2116 10/02/23 0440  BP: (!) 140/75 (!) 144/71  Pulse: 76 79  Resp: 18 18  Temp: 99 F (37.2 C) 98.7 F (37.1 C)  SpO2: 100% 100%    Intake/Output Summary (Last 24 hours) at 10/02/2023 1238 Last data filed at 10/01/2023 2300 Gross per 24 hour  Intake 1248.42 ml  Output 1100 ml  Net  148.42 ml   Filed Weights   09/29/23 1228 09/29/23 1750  Weight: 81.6 kg 79.8 kg   Body mass index is 27.55 kg/m.   Physical Exam: GENERAL: Patient is alert awake and oriented to self,. Not in obvious distress. HENT: No scleral pallor or icterus. Pupils equally reactive to light. Oral mucosa is moist NECK: is supple, no gross swelling noted. CHEST: Clear to auscultation. No crackles or wheezes.   CVS: S1 and S2  heard, no murmur. Regular rate and rhythm.  ABDOMEN: Soft, non-tender, bowel sounds are present. EXTREMITIES: No edema. CNS: Cranial nerves are intact. No focal motor deficits.  Likely cognitive dysfunction with dementia, SKIN: warm and dry without rashes.  Data Review: I have personally reviewed the following laboratory data and studies,  CBC: Recent Labs  Lab 09/29/23 1234 09/30/23 0240 10/01/23 0800 10/02/23 0409  WBC 9.9 11.0* 8.3 7.9  NEUTROABS 8.2*  --  6.1 5.8  HGB 14.3 14.9 9.4* 11.6*  HCT 50.4* 50.8* 32.9* 40.2  MCV 97.5 95.8 99.7 94.1  PLT 165 161 84* 105*   Basic Metabolic Panel: Recent Labs  Lab 09/29/23 1757 09/29/23 1802 09/29/23 2340 09/30/23 0240 09/30/23 0544 10/01/23 0300 10/02/23 0409  NA  --    < > 162* 162* 161* 146* 141  K  --    < > 4.3 3.9 3.8 3.8 3.4*  CL  --    < > 124* 123* 122* 114* 106  CO2  --    < > 26 26 28  17* 27  GLUCOSE  --    < > 196* 200* 222* 111* 179*  BUN  --    < > 35* 35* 36* 21 16  CREATININE  --    < > 0.98 0.82 0.92 0.32* 0.54  CALCIUM  --    < > 9.2 9.2 9.0 7.4* 7.7*  MG 2.1  --   --   --   --  1.1* 1.8   < > = values in this interval not displayed.   Liver Function Tests: Recent Labs  Lab 09/29/23 1234 09/30/23 0240 10/01/23 0300  AST 33 36 20  ALT 55* 48* 21  ALKPHOS 80 67 34*  BILITOT 1.8* 0.9 0.7  PROT 6.3* 5.7* 3.0*  ALBUMIN  3.4* 2.9* <1.5*   No results for input(s): LIPASE, AMYLASE in the last 168 hours. Recent Labs  Lab 09/29/23 1757  AMMONIA 18   Cardiac Enzymes: Recent Labs  Lab 09/29/23 1802 10/01/23 0300  CKTOTAL 938* 250*   BNP (last 3 results) No results for input(s): BNP in the last 8760 hours.  ProBNP (last 3 results) No results for input(s): PROBNP in the last 8760 hours.  CBG: Recent Labs  Lab 10/01/23 1150 10/01/23 1813 10/01/23 2122 10/02/23 0747 10/02/23 1136  GLUCAP 299* 299* 296* 208* 221*   No results found for this or any previous visit (from the past 240  hours).   Studies: No results found.    Nicolae Vasek, MD  [facility] 10/02/2023  If 7PM-7AM, please contact night-coverage

## 2023-10-02 NOTE — Inpatient Diabetes Management (Signed)
 Inpatient Diabetes Program Recommendations  AACE/ADA: New Consensus Statement on Inpatient Glycemic Control (2015)  Target Ranges:  Prepandial:   less than 140 mg/dL      Peak postprandial:   less than 180 mg/dL (1-2 hours)      Critically ill patients:  140 - 180 mg/dL    Latest Reference Range & Units 10/01/23 07:24 10/01/23 11:02 10/01/23 11:50 10/01/23 18:13 10/01/23 21:22  Glucose-Capillary 70 - 99 mg/dL 762 (H)  3 units Novolog   302 (H)    299 (H)  5 units Novolog   10 units Semglee  @1414  299 (H)  8 units Novolog   296 (H)  3 units Novolog    (H): Data is abnormally high  Latest Reference Range & Units 10/02/23 07:47  Glucose-Capillary 70 - 99 mg/dL 791 (H)  5 units Novolog    (H): Data is abnormally high    Home DM Meds: Metformin  500 mg BID    Current Orders: Novolog  Moderate Correction Scale/ SSI (0-15 units) TID AC + HS     Semglee  10 units daily    MD- Please consider:  1. Increase Semglee  slightly to 12 units daily  2. Start Novolog  Meal Coverage: Novolog  3 units TID with meals HOLD if pt NPO HOLD if pt eats <50% meals    --Will follow patient during hospitalization--  Adina Rudolpho Arrow RN, MSN, CDCES Diabetes Coordinator Inpatient Glycemic Control Team Team Pager: [phone] (8a-5p)

## 2023-10-02 NOTE — Evaluation (Signed)
 Occupational Therapy Evaluation Patient Details Name: Suzanne Duke MRN: [id number] DOB: 1960/10/16 Today's Date: 10/02/2023   History of Present Illness   Patient is a 63 y/o female admitted 09/29/23 from home after spouse found her down on her porch soiled in urine and bugs crawling on her. Found to be dehydrated, confused, in DKA and with hypernatremia.  PMH positive for HNT, DM, GERD, anxiety, depression, s/p bariatric surgery, IDA, HASH, thyroid  goiter.     Clinical Impressions PTA, patient lives alone at indep level. Currently, patient presents with deficits outlined below (see OT Problem List for details) most significantly decreased cognition, LB strength, balance, activity tolerance limiting BADL's and functional mobility.  Patient requires continued Acute care hospital level OT services to progress safety and functional performance and allow for discharge. Patient will benefit from continued inpatient follow up therapy, <3 hours/day       If plan is discharge home, recommend the following:   Two people to help with walking and/or transfers;A lot of help with bathing/dressing/bathroom;Direct supervision/assist for medications management;Direct supervision/assist for financial management;Assist for transportation;Help with stairs or ramp for entrance;Supervision due to cognitive status     Functional Status Assessment   Patient has had a recent decline in their functional status and demonstrates the ability to make significant improvements in function in a reasonable and predictable amount of time.     Equipment Recommendations   Other (comment) (TBD post rehab venue)      Precautions/Restrictions   Precautions Precautions: Fall Recall of Precautions/Restrictions: Impaired Restrictions Weight Bearing Restrictions Per Provider Order: No     Mobility Bed Mobility Overal bed mobility: Needs Assistance Bed Mobility: Supine to Sit, Sit to Supine     Supine to sit:  Used rails, HOB elevated, Contact guard Sit to supine: Mod assist   General bed mobility comments: use of bed features with cues    Transfers Overall transfer level: Needs assistance Equipment used: Rolling walker (2 wheels) Transfers: Sit to/from Stand Sit to Stand: From elevated surface, Max assist           General transfer comment: increased time, cues for hand placement, heavy assist for STS      Balance Overall balance assessment: Needs assistance   Sitting balance-Leahy Scale: Good Sitting balance - Comments: able to scoot up in bed in long sitting Postural control: Posterior lean Standing balance support: Reliant on assistive device for balance, Bilateral upper extremity supported Standing balance-Leahy Scale: Poor                             ADL either performed or assessed with clinical judgement   ADL Overall ADL's : Needs assistance/impaired Eating/Feeding: Set up;Sitting   Grooming: Wash/dry hands;Wash/dry face;Oral care;Set up;Cueing for sequencing;Sitting   Upper Body Bathing: Set up;Cueing for sequencing;Sitting   Lower Body Bathing: Maximal assistance;Cueing for sequencing;Sit to/from stand   Upper Body Dressing : Minimal assistance;Cueing for sequencing   Lower Body Dressing: Maximal assistance;Cueing for safety;Cueing for sequencing   Toilet Transfer: Moderate assistance;Cueing for sequencing;Stand-pivot   Toileting- Clothing Manipulation and Hygiene: Moderate assistance;Sitting/lateral lean       Functional mobility during ADLs: Moderate assistance;+2 for safety/equipment;+2 for physical assistance;Cueing for sequencing General ADL Comments: open to all therapy presented, pleasantly confused, high falls risk     Vision Baseline Vision/History: 1 Wears glasses;0 No visual deficits              Pertinent Vitals/Pain Pain Assessment  Pain Assessment: No/denies pain     Extremity/Trunk Assessment Upper Extremity  Assessment Upper Extremity Assessment: Left hand dominant;Overall [facility] for tasks assessed   Lower Extremity Assessment Lower Extremity Assessment: Defer to PT evaluation   Cervical / Trunk Assessment Cervical / Trunk Assessment: Normal   Communication Communication Communication: No apparent difficulties   Cognition Arousal: Alert Behavior During Therapy: WFL for tasks assessed/performed Cognition: Cognition impaired   Orientation impairments: Time, Situation Awareness: Intellectual awareness impaired, Online awareness impaired Memory impairment (select all impairments): Short-term memory Attention impairment (select first level of impairment): Sustained attention Executive functioning impairment (select all impairments): Organization, Sequencing, Reasoning, Problem solving OT - Cognition Comments: poor insight and judgement, lack of awarenbess of deficits, poor historian of recent functional level                 Following commands: Intact       Cueing  General Comments   Cueing Techniques: Verbal cues  no skin issues, HR between 78-88 bpm thru visit           Home Living Family/patient expects to be discharged to:: Private residence Living Arrangements: Alone Available Help at Discharge:  (unknown) Type of Home: House Home Access: Level entry     Home Layout: One level     Bathroom Shower/Tub: Tub/shower unit;Curtain   Firefighter: Standard     Home Equipment: Shower seat;Grab bars - tub/shower   Additional Comments: states one level home without steps, though pt poor historian, no family present      Prior Functioning/Environment Prior Level of Function : Independent/Modified Independent                    OT Problem List: Decreased strength;Decreased activity tolerance;Impaired balance (sitting and/or standing);Decreased cognition;Decreased safety awareness;Decreased knowledge of use of DME or AE;Decreased knowledge of precautions    OT Treatment/Interventions: Self-care/ADL training;Therapeutic exercise;Neuromuscular education;Energy conservation;DME and/or AE instruction;Therapeutic activities;Cognitive remediation/compensation;Patient/family education;Balance training      OT Goals(Current goals can be found in the care plan section)   Acute Rehab OT Goals Patient Stated Goal: to be stronger OT Goal Formulation: With patient Time For Goal Achievement: 10/16/23 Potential to Achieve Goals: Fair   OT Frequency:  Min 2X/week       AM-PAC OT 6 Clicks Daily Activity     Outcome Measure Help from another person eating meals?: A Little Help from another person taking care of personal grooming?: A Little Help from another person toileting, which includes using toliet, bedpan, or urinal?: A Lot Help from another person bathing (including washing, rinsing, drying)?: A Lot Help from another person to put on and taking off regular upper body clothing?: A Little Help from another person to put on and taking off regular lower body clothing?: A Lot 6 Click Score: 15   End of Session Equipment Utilized During Treatment: Gait belt;Rolling walker (2 wheels) Nurse Communication: Mobility status;Weight bearing status  Activity Tolerance: Patient tolerated treatment well Patient left: in chair;with chair alarm set;with call bell/phone within reach;Other (comment) (telesitter in view)  OT Visit Diagnosis: Unsteadiness on feet (R26.81);History of falling (Z91.81);Muscle weakness (generalized) (M62.81);Cognitive communication deficit (R41.841) Symptoms and signs involving cognitive functions: Other cerebrovascular disease                Time: 9140-9070 OT Time Calculation (min): 30 min Charges:  OT General Charges $OT Visit: 1 Visit OT Evaluation $OT Eval Moderate Complexity: 1 Mod OT Treatments $Self Care/Home Management : 8-22 mins Suzanne Duke OT/L  Acute Rehabilitation Department  [phone]  10/02/2023, 6:41 PM

## 2023-10-03 LAB — CBC WITH DIFFERENTIAL/PLATELET
Abs Immature Granulocytes: 0.04 K/uL (ref 0.00–0.07)
Basophils Absolute: 0 K/uL (ref 0.0–0.1)
Basophils Relative: 0 %
Eosinophils Absolute: 0 K/uL (ref 0.0–0.5)
Eosinophils Relative: 1 %
HCT: 36.7 % (ref 36.0–46.0)
Hemoglobin: 11.3 g/dL — ABNORMAL LOW (ref 12.0–15.0)
Immature Granulocytes: 1 %
Lymphocytes Relative: 24 %
Lymphs Abs: 1.7 K/uL (ref 0.7–4.0)
MCH: 28.3 pg (ref 26.0–34.0)
MCHC: 30.8 g/dL (ref 30.0–36.0)
MCV: 91.8 fL (ref 80.0–100.0)
Monocytes Absolute: 0.4 K/uL (ref 0.1–1.0)
Monocytes Relative: 6 %
Neutro Abs: 4.9 K/uL (ref 1.7–7.7)
Neutrophils Relative %: 68 %
Platelets: 110 K/uL — ABNORMAL LOW (ref 150–400)
RBC: 4 MIL/uL (ref 3.87–5.11)
RDW: 12.4 % (ref 11.5–15.5)
WBC: 7.2 K/uL (ref 4.0–10.5)
nRBC: 0 % (ref 0.0–0.2)

## 2023-10-03 LAB — BASIC METABOLIC PANEL WITH GFR
Anion gap: 7 (ref 5–15)
BUN: 12 mg/dL (ref 8–23)
CO2: 29 mmol/L (ref 22–32)
Calcium: 8 mg/dL — ABNORMAL LOW (ref 8.9–10.3)
Chloride: 107 mmol/L (ref 98–111)
Creatinine, Ser: 0.53 mg/dL (ref 0.44–1.00)
GFR, Estimated: >60 mL/min (ref >60)
Glucose, Bld: 150 mg/dL — ABNORMAL HIGH (ref 70–99)
Potassium: 3.5 mmol/L (ref 3.5–5.1)
Sodium: 143 mmol/L (ref 135–145)

## 2023-10-03 LAB — GLUCOSE, CAPILLARY
Glucose-Capillary: 156 mg/dL — ABNORMAL HIGH (ref 70–99)
Glucose-Capillary: 267 mg/dL — ABNORMAL HIGH (ref 70–99)
Glucose-Capillary: 221 mg/dL — ABNORMAL HIGH (ref 70–99)
Glucose-Capillary: 291 mg/dL — ABNORMAL HIGH (ref 70–99)

## 2023-10-03 LAB — MAGNESIUM: Magnesium: 1.5 mg/dL — ABNORMAL LOW (ref 1.7–2.4)

## 2023-10-03 MED ORDER — POTASSIUM CHLORIDE CRYS ER 20 MEQ PO TBCR
40.0000 meq | EXTENDED_RELEASE_TABLET | Freq: Once | ORAL | Status: AC
  Administered 2023-10-03: 40 meq via ORAL
  Filled 2023-10-03: qty 2

## 2023-10-03 MED ORDER — MAGNESIUM OXIDE -MG SUPPLEMENT 400 (240 MG) MG PO TABS
400.0000 mg | ORAL_TABLET | Freq: Two times a day (BID) | ORAL | Status: DC
  Administered 2023-10-03 – 2023-10-14 (×29): 400 mg via ORAL
  Filled 2023-10-03 (×23): qty 1

## 2023-10-03 NOTE — Progress Notes (Addendum)
 PROGRESS NOTE  Suzanne Duke FMW:[id number] DOB: 03/19/1960 DOA: 09/29/2023 PCP: Levora Reyes [name], MD   LOS: 4 days   Brief narrative:  Suzanne Duke is a 63 year old female with past medical history of DM II, HTN, GERD, anxiety/depression, bariatric surgery, IDA, NASH, thyroid  goiter presented to hospital from home after being found on her porch for unknown amount of time.  She was seen by her neighbor and EMS was called thin.  In the ED patient was grossly confused.  It was reported that her husband moved out a few months prior due to change in her behavior and aggression.  Patient was noted to be in DKA and was started on IV fluids and insulin  drip.  She was also noted to have significant hypernatremia in the setting of volume depletion and dehydration.   Assessment/Plan: Active Problems:   Hypernatremia   Acute metabolic encephalopathy   Normocytic anemia   Unsatisfactory living conditions   Rhabdomyolysis   Essential hypertension   GOITER, NONTOXIC MULTINODULAR   Elevated troponin  DKA, type 2 with mild hyperglycemia Resolved after insulin  drip.  Currently on basal insulin /sliding scale insulin , diabetic diet.  Will continue to monitor closely.  Glycemic control adequate at this time.    Hypernatremia Received IV fluid hydration with improvement of sodium to 143 at this time.  Will discontinue IV fluids.  Encourage oral hydration.   Normocytic anemia Hemoglobin today at 11.3.  Initially hemodiluted. Folate low, at 3 and starting on folic acid  supplement. B12 level low/normal 390 continue vitamin B12 supplementation.  Acute metabolic encephalopathy with possible underlying dementia. - Unclear what her baseline is and no family able to provide collateral information.  I again tried to reach out to the patient's husband but was unable to talk to him.  MRI of the brain with parenchymal loss.  Continue thiamine .  Pending vitamin B1 level. ammonia within normal limits.      Rhabdomyolysis - Initial CK9 938 on admission.  On IV fluids.  Latest CK level of 250.  Improved after hydration.  Received LR.  Hypomagnesemia.  Will replace orally twice daily for now.  Unsatisfactory living conditions Continue TOC assistance.   Essential hypertension Not on medications for months now.   Nontoxic multinodular goiter. Prior treatment by endocrinology with stable ultrasound previously.  TSH  suppressed, 0.274.  Free T4 at 1.0.   Elevated troponin Flat troponins.   EKG with sinus rhythm.  No acute issues at this time.  Hyperkalemia followed by mild hypokalemia improved.  Latest potassium of 3.5.  Will continue to replenish orally 40 mill equivalents again today   DVT prophylaxis: SCDs Start: 09/29/23 1659   Disposition: Pending placement.  Unable to reach the patient's husband despite multiple attempts.  APS case as per TOC.  Medically stable for disposition  Status is: Inpatient Remains inpatient appropriate because: Pending placement, awaiting safe disposition    Code Status:     Code Status: Full Code  Family Communication: None available  Consultants: None  Procedures: None  Anti-infectives:  None  Anti-infectives (From admission, onward)    None      Subjective: Today, patient was seen and examined at bedside.  Patient denies any pain, nausea vomiting, fever, chills or rigor.  Objective: Vitals:   10/02/23 2035 10/03/23 0503  BP: (!) 140/60 136/80  Pulse: 81 71  Resp: 12 14  Temp: 98.4 F (36.9 C) 98.5 F (36.9 C)  SpO2: 99% 98%    Intake/Output Summary (Last 24 hours) at  10/03/2023 1116 Last data filed at 10/03/2023 0506 Gross per 24 hour  Intake 240 ml  Output 2800 ml  Net -2560 ml   Filed Weights   09/29/23 1228 09/29/23 1750  Weight: 81.6 kg 79.8 kg   Body mass index is 27.55 kg/m.   Physical Exam: GENERAL: Patient is alert awake and oriented to self and place,. Not in obvious distress. HENT: No scleral pallor or  icterus. Pupils equally reactive to light. Oral mucosa is moist NECK: is supple, no gross swelling noted. CHEST: Clear to auscultation. No crackles or wheezes.   CVS: S1 and S2 heard, no murmur. Regular rate and rhythm.  ABDOMEN: Soft, non-tender, bowel sounds are present. EXTREMITIES: No edema. CNS: Cranial nerves are intact. No focal motor deficits.  Likely cognitive dysfunction with dementia, SKIN: warm and dry without rashes.  Data Review: I have personally reviewed the following laboratory data and studies,  CBC: Recent Labs  Lab 09/29/23 1234 09/30/23 0240 10/01/23 0800 10/02/23 0409 10/03/23 0354  WBC 9.9 11.0* 8.3 7.9 7.2  NEUTROABS 8.2*  --  6.1 5.8 4.9  HGB 14.3 14.9 9.4* 11.6* 11.3*  HCT 50.4* 50.8* 32.9* 40.2 36.7  MCV 97.5 95.8 99.7 94.1 91.8  PLT 165 161 84* 105* 110*   Basic Metabolic Panel: Recent Labs  Lab 09/29/23 1757 09/29/23 1802 09/30/23 0240 09/30/23 0544 10/01/23 0300 10/02/23 0409 10/03/23 0354  NA  --    < > 162* 161* 146* 141 143  K  --    < > 3.9 3.8 3.8 3.4* 3.5  CL  --    < > 123* 122* 114* 106 107  CO2  --    < > 26 28 17* 27 29  GLUCOSE  --    < > 200* 222* 111* 179* 150*  BUN  --    < > 35* 36* 21 16 12   CREATININE  --    < > 0.82 0.92 0.32* 0.54 0.53  CALCIUM  --    < > 9.2 9.0 7.4* 7.7* 8.0*  MG 2.1  --   --   --  1.1* 1.8 1.5*   < > = values in this interval not displayed.   Liver Function Tests: Recent Labs  Lab 09/29/23 1234 09/30/23 0240 10/01/23 0300  AST 33 36 20  ALT 55* 48* 21  ALKPHOS 80 67 34*  BILITOT 1.8* 0.9 0.7  PROT 6.3* 5.7* 3.0*  ALBUMIN  3.4* 2.9* <1.5*   No results for input(s): LIPASE, AMYLASE in the last 168 hours. Recent Labs  Lab 09/29/23 1757  AMMONIA 18   Cardiac Enzymes: Recent Labs  Lab 09/29/23 1802 10/01/23 0300  CKTOTAL 938* 250*   BNP (last 3 results) No results for input(s): BNP in the last 8760 hours.  ProBNP (last 3 results) No results for input(s): PROBNP in the  last 8760 hours.  CBG: Recent Labs  Lab 10/02/23 0747 10/02/23 1136 10/02/23 1623 10/02/23 2037 10/03/23 0756  GLUCAP 208* 221* 201* 190* 156*   No results found for this or any previous visit (from the past 240 hours).   Studies: No results found.    Loralyn Rachel, MD  [facility] 10/03/2023  If 7PM-7AM, please contact night-coverage
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
## 2023-10-03 NOTE — Inpatient Diabetes Management (Signed)
 Inpatient Diabetes Program Recommendations  AACE/ADA: New Consensus Statement on Inpatient Glycemic Control (2015)  Target Ranges:  Prepandial:   less than 140 mg/dL      Peak postprandial:   less than 180 mg/dL (1-2 hours)      Critically ill patients:  140 - 180 mg/dL    Latest Reference Range & Units 10/02/23 07:47 10/02/23 11:36 10/02/23 16:23 10/02/23 20:37  Glucose-Capillary 70 - 99 mg/dL 791 (H)  5 units Novolog   221 (H)  5 units Novolog   10 units Semglee   201 (H)  5 units Novolog   190 (H)  3 units Novolog    (H): Data is abnormally high  Latest Reference Range & Units 10/03/23 07:56  Glucose-Capillary 70 - 99 mg/dL 843 (H)  3 units Novolog   10 units Semglee   (H): Data is abnormally high   Home DM Meds: Metformin  500 mg BID      Current Orders: Novolog  Moderate Correction Scale/ SSI (0-15 units) TID AC + HS                           Semglee  10 units daily    MD- Please consider starting Novolog  Meal Coverage: Novolog  3 units TID with meals HOLD if pt NPO HOLD if pt eats <50% meals    --Will follow patient during hospitalization--  Adina Rudolpho Arrow RN, MSN, CDCES Diabetes Coordinator Inpatient Glycemic Control Team Team Pager: [phone] (8a-5p)

## 2023-10-03 NOTE — Progress Notes (Signed)
 Physical Therapy Treatment Patient Details Name: Suzanne Duke MRN: [id number] DOB: 01-Dec-1960 Today's Date: 10/03/2023   History of Present Illness Patient is a 63 y/o female admitted 09/29/23 from home after spouse found her down on her porch soiled in urine and bugs crawling on her. Found to be dehydrated, confused, in DKA and with hypernatremia.  PMH positive for HNT, DM, GERD, anxiety, depression, s/p bariatric surgery, IDA, HASH, thyroid  goiter.    PT Comments  AxO x 1 pleasantly confused requiring repeat instructions and increased time to process.  Easily distracted.  Engaging in conversation but unclear dates, times events. Pt stated she lives with her Spouse and 26 year old Son Holiday representative.  Pt unable to recall any events leading to her hospitalization.  Assisted OOB required increased effort.  General bed mobility comments: repeat VCs and increased time to complete task as well as increased assist to scoot to EOB.  General transfer comment: increased time, cues for hand placement, heavy assist from elevated bed to BCS.  Assisted with peri care as pt was unable due to impaired balance. General Gait Details: + 2 assist such that recliner was following.  Limited amb distance and repeat VC's for proper walker to self distance and safety with turns. Unsteady esp with turns.  HIGH FALL RISK.  LPT has rec Pt will need ST Rehab at SNF to address mobility and functional decline prior to safely returning home.    If plan is discharge home, recommend the following: A lot of help with bathing/dressing/bathroom;A lot of help with walking and/or transfers;Assistance with cooking/housework;Assist for transportation;Help with stairs or ramp for entrance   Can travel by private vehicle     No  Equipment Recommendations  Rolling walker (2 wheels)    Recommendations for Other Services       Precautions / Restrictions Precautions Precautions: Fall Recall of Precautions/Restrictions:  Impaired Restrictions Weight Bearing Restrictions Per Provider Order: No     Mobility  Bed Mobility Overal bed mobility: Needs Assistance Bed Mobility: Supine to Sit     Supine to sit: Min assist, Mod assist     General bed mobility comments: repeat VCs and increased time to complete task as well as increased assist to scoot to EOB    Transfers Overall transfer level: Needs assistance Equipment used: Rolling walker (2 wheels) Transfers: Sit to/from Stand Sit to Stand: From elevated surface, Min assist, Mod assist           General transfer comment: increased time, cues for hand placement, heavy assist from elevated bed to BCS.  Assisted with peri care as pt was unable due to impaired balance.    Ambulation/Gait Ambulation/Gait assistance: Min assist, Mod assist Gait Distance (Feet): 38 Feet Assistive device: Rolling walker (2 wheels) Gait Pattern/deviations: Step-through pattern, Step-to pattern, Wide base of support, Decreased stride length Gait velocity: decreased     General Gait Details: + 2 assist such that recliner was following.  Limited amb distance and repeat VC's for proper walker to self distance and safety with turns.   Stairs             Wheelchair Mobility     Tilt Bed    Modified Rankin (Stroke Patients Only)       Balance  Communication Communication Communication: No apparent difficulties  Cognition Arousal: Alert Behavior During Therapy: WFL for tasks assessed/performed   PT - Cognitive impairments: Orientation, Memory, Problem solving, Safety/Judgement   Orientation impairments: Place, Time, Situation                   PT - Cognition Comments: AxO x 1 pleasantly confused requiring repeat instructions and increased time to process.  Easily distracted.  Engaging in conversation but unclear dates, times events. Following commands: Impaired Following commands  impaired: Follows one step commands inconsistently    Cueing Cueing Techniques: Verbal cues  Exercises      General Comments        Pertinent Vitals/Pain Pain Assessment Pain Assessment: No/denies pain    Home Living                          Prior Function            PT Goals (current goals can now be found in the care plan section) Progress towards PT goals: Progressing toward goals    Frequency    Min 2X/week      PT Plan      Co-evaluation              AM-PAC PT 6 Clicks Mobility   Outcome Measure  Help needed turning from your back to your side while in a flat bed without using bedrails?: A Lot Help needed moving from lying on your back to sitting on the side of a flat bed without using bedrails?: A Lot Help needed moving to and from a bed to a chair (including a wheelchair)?: A Lot Help needed standing up from a chair using your arms (e.g., wheelchair or bedside chair)?: A Lot Help needed to walk in hospital room?: A Lot Help needed climbing 3-5 steps with a railing? : Total 6 Click Score: 11    End of Session Equipment Utilized During Treatment: Gait belt Activity Tolerance: Patient limited by fatigue Patient left: in chair;with call bell/phone within reach;with chair alarm set Nurse Communication: Mobility status PT Visit Diagnosis: Other abnormalities of gait and mobility (R26.89);Muscle weakness (generalized) (M62.81);History of falling (Z91.81);Other symptoms and signs involving the nervous system (R29.898)     Time: 8457-8384 PT Time Calculation (min) (ACUTE ONLY): 33 min  Charges:    $Gait Training: 8-22 mins $Therapeutic Activity: 8-22 mins PT General Charges $$ ACUTE PT VISIT: 1 Visit                    Katheryn Leap  PTA Acute  Rehabilitation Services Office M-F          [phone]

## 2023-10-03 NOTE — Progress Notes (Signed)
 Pt's husband Shakari Qazi. would like to speak to the doctor to give him information. He said he would like to speak to case manager as well. Pt agreeable to this. Husband's number put on chart. He can be reached at [phone]

## 2023-10-04 LAB — MAGNESIUM: Magnesium: 1.6 mg/dL — ABNORMAL LOW (ref 1.7–2.4)

## 2023-10-04 LAB — BASIC METABOLIC PANEL WITH GFR
Anion gap: 8 (ref 5–15)
BUN: 16 mg/dL (ref 8–23)
CO2: 27 mmol/L (ref 22–32)
Calcium: 7.9 mg/dL — ABNORMAL LOW (ref 8.9–10.3)
Chloride: 102 mmol/L (ref 98–111)
Creatinine, Ser: 0.66 mg/dL (ref 0.44–1.00)
GFR, Estimated: >60 mL/min (ref >60)
Glucose, Bld: 187 mg/dL — ABNORMAL HIGH (ref 70–99)
Potassium: 4 mmol/L (ref 3.5–5.1)
Sodium: 137 mmol/L (ref 135–145)

## 2023-10-04 LAB — CBC WITH DIFFERENTIAL/PLATELET
Abs Immature Granulocytes: 0.06 K/uL (ref 0.00–0.07)
Basophils Absolute: 0 K/uL (ref 0.0–0.1)
Basophils Relative: 0 %
Eosinophils Absolute: 0 K/uL (ref 0.0–0.5)
Eosinophils Relative: 1 %
HCT: 39.8 % (ref 36.0–46.0)
Hemoglobin: 11.7 g/dL — ABNORMAL LOW (ref 12.0–15.0)
Immature Granulocytes: 1 %
Lymphocytes Relative: 20 %
Lymphs Abs: 1.4 K/uL (ref 0.7–4.0)
MCH: 27.2 pg (ref 26.0–34.0)
MCHC: 29.4 g/dL — ABNORMAL LOW (ref 30.0–36.0)
MCV: 92.6 fL (ref 80.0–100.0)
Monocytes Absolute: 0.6 K/uL (ref 0.1–1.0)
Monocytes Relative: 9 %
Neutro Abs: 4.6 K/uL (ref 1.7–7.7)
Neutrophils Relative %: 69 %
Platelets: 115 K/uL — ABNORMAL LOW (ref 150–400)
RBC: 4.3 MIL/uL (ref 3.87–5.11)
RDW: 12.5 % (ref 11.5–15.5)
WBC: 6.7 K/uL (ref 4.0–10.5)
nRBC: 0 % (ref 0.0–0.2)

## 2023-10-04 LAB — GLUCOSE, CAPILLARY
Glucose-Capillary: 174 mg/dL — ABNORMAL HIGH (ref 70–99)
Glucose-Capillary: 265 mg/dL — ABNORMAL HIGH (ref 70–99)
Glucose-Capillary: 235 mg/dL — ABNORMAL HIGH (ref 70–99)
Glucose-Capillary: 286 mg/dL — ABNORMAL HIGH (ref 70–99)

## 2023-10-04 NOTE — Inpatient Diabetes Management (Signed)
 Inpatient Diabetes Program Recommendations  AACE/ADA: New Consensus Statement on Inpatient Glycemic Control (2015)  Target Ranges:  Prepandial:   less than 140 mg/dL      Peak postprandial:   less than 180 mg/dL (1-2 hours)      Critically ill patients:  140 - 180 mg/dL    Latest Reference Range & Units 10/03/23 07:56 10/03/23 12:17 10/03/23 17:00 10/03/23 20:31  Glucose-Capillary 70 - 99 mg/dL 843 (H)  3 units Novolog   10 units Semglee   267 (H)  8 units Novolog   221 (H)  5 units Novolog   291 (H)  3 units Novolog    (H): Data is abnormally high     Home DM Meds: Metformin  500 mg BID      Current Orders: Novolog  Moderate Correction Scale/ SSI (0-15 units) TID AC + HS                           Semglee  10 units daily       MD- Please consider starting Novolog  Meal Coverage: Novolog  3 units TID with meals HOLD if pt NPO HOLD if pt eats <50% meals    --Will follow patient during hospitalization--  Adina Rudolpho Arrow RN, MSN, CDCES Diabetes Coordinator Inpatient Glycemic Control Team Team Pager: [phone] (8a-5p)

## 2023-10-04 NOTE — Progress Notes (Signed)
 PROGRESS NOTE  Suzanne Duke FMW:[id number] DOB: 1960-10-15 DOA: 09/29/2023 PCP: Levora Reyes [name], MD   LOS: 5 days   Brief narrative:  Suzanne Duke is a 63 year old female with past medical history of DM II, HTN, GERD, anxiety/depression, bariatric surgery, IDA, NASH, thyroid  goiter presented to hospital from home after being found on her porch for unknown amount of time.  She was seen by her neighbor and EMS was called thin.  In the ED, patient was grossly confused.  It was reported that her husband moved out a few months prior due to change in her behavior and aggression.  Patient was noted to be in DKA and was started on IV fluids and insulin  drip.  She was also noted to have significant hypernatremia in the setting of volume depletion and dehydration.   Assessment/Plan: Active Problems:   Hypernatremia   Acute metabolic encephalopathy   Normocytic anemia   Unsatisfactory living conditions   Rhabdomyolysis   Essential hypertension   GOITER, NONTOXIC MULTINODULAR   Elevated troponin  DKA, type 2 with mild hyperglycemia Resolved after insulin  drip.  Currently on basal insulin /sliding scale insulin , diabetic diet.  Will continue to monitor closely.  Glycemic control adequate at this time.  Latest POC glucose of 174    Hypernatremia Received IV fluid hydration with improvement of sodium to 137 at this time.  Off IV fluids.  Encourage oral hydration.   Normocytic anemia Hemoglobin today at 11.7.  Initially hemodiluted. Folate low, at 3 and starting on folic acid  supplement. B12 level low/normal 390 continue vitamin B12 supplementation.  Acute metabolic encephalopathy with possible underlying dementia. - Unclear what her baseline is and no family able to provide collateral information.  Unable to reach patient's husband.  MRI of the brain with parenchymal loss.  Continue thiamine .  Pending vitamin B1 level. ammonia within normal limits.  Likely has underlying dementia.  Patient is oriented  to self only but does not seem to be in delirium.   Rhabdomyolysis - Initial CK9 938 on admission.  On IV fluids.  Latest CK level of 250.  Improved after hydration.  Received LR.  Hypomagnesemia.  Will replace orally twice daily for now.  Unsatisfactory living conditions Continue TOC assistance.   Essential hypertension Not on medications for months now.   Nontoxic multinodular goiter. Prior treatment by endocrinology with stable ultrasound previously.  TSH  suppressed, 0.274.  Free T4 at 1.0.   Elevated troponin Flat troponins.   EKG with sinus rhythm.  No acute issues at this time.  Hyperkalemia followed by mild hypokalemia improved.  Latest potassium of 4.0   DVT prophylaxis: SCDs Start: 09/29/23 1659   Disposition: Pending placement.  Unable to reach the patient's husband despite multiple attempts on 10/05/2023.  APS case as per TOC.  Medically stable for disposition  Status is: Inpatient Remains inpatient appropriate because: Pending placement, awaiting safe disposition    Code Status:     Code Status: Full Code  Family Communication: None available  Consultants: None  Procedures: None  Anti-infectives:  None  Anti-infectives (From admission, onward)    None      Subjective: Today, patient was seen and examined at bedside.  Patient unaware why she is here in the hospital.  Oriented to self.  Does not appear to be delirious but disoriented.  Denies any pain nausea vomiting fever chills or rigor.  Objective: Vitals:   10/03/23 2030 10/04/23 0511  BP: 129/60 (!) 130/54  Pulse: 81 78  Resp: 16  14  Temp: 98.5 F (36.9 C) 98.1 F (36.7 C)  SpO2: 100% 100%    Intake/Output Summary (Last 24 hours) at 10/04/2023 1023 Last data filed at 10/04/2023 0900 Gross per 24 hour  Intake 240 ml  Output 1900 ml  Net -1660 ml   Filed Weights   09/29/23 1228 09/29/23 1750  Weight: 81.6 kg 79.8 kg   Body mass index is 27.55 kg/m.   Physical Exam: General:   Average built, not in obvious distress alert awake and oriented to self, Communicative, HENT:   No scleral pallor or icterus noted. Oral mucosa is moist.  Chest:  Clear breath sounds. No crackles or wheezes.  CVS: S1 &S2 heard. No murmur.  Regular rate and rhythm. Abdomen: Soft, nontender, nondistended.  Bowel sounds are heard.   Extremities: No cyanosis, clubbing or edema.  Peripheral pulses are palpable. Psych: Alert, awake and oriented to self, cognitive dysfunction, normal mood CNS:  No cranial nerve deficits.  Power equal in all extremities.   Skin: Warm and dry.  No rashes noted.   Data Review: I have personally reviewed the following laboratory data and studies,  CBC: Recent Labs  Lab 09/29/23 1234 09/30/23 0240 10/01/23 0800 10/02/23 0409 10/03/23 0354 10/04/23 0404  WBC 9.9 11.0* 8.3 7.9 7.2 6.7  NEUTROABS 8.2*  --  6.1 5.8 4.9 4.6  HGB 14.3 14.9 9.4* 11.6* 11.3* 11.7*  HCT 50.4* 50.8* 32.9* 40.2 36.7 39.8  MCV 97.5 95.8 99.7 94.1 91.8 92.6  PLT 165 161 84* 105* 110* 115*   Basic Metabolic Panel: Recent Labs  Lab 09/29/23 1757 09/29/23 1802 09/30/23 0544 10/01/23 0300 10/02/23 0409 10/03/23 0354 10/04/23 0404  NA  --    < > 161* 146* 141 143 137  K  --    < > 3.8 3.8 3.4* 3.5 4.0  CL  --    < > 122* 114* 106 107 102  CO2  --    < > 28 17* 27 29 27   GLUCOSE  --    < > 222* 111* 179* 150* 187*  BUN  --    < > 36* 21 16 12 16   CREATININE  --    < > 0.92 0.32* 0.54 0.53 0.66  CALCIUM  --    < > 9.0 7.4* 7.7* 8.0* 7.9*  MG 2.1  --   --  1.1* 1.8 1.5* 1.6*   < > = values in this interval not displayed.   Liver Function Tests: Recent Labs  Lab 09/29/23 1234 09/30/23 0240 10/01/23 0300  AST 33 36 20  ALT 55* 48* 21  ALKPHOS 80 67 34*  BILITOT 1.8* 0.9 0.7  PROT 6.3* 5.7* 3.0*  ALBUMIN  3.4* 2.9* <1.5*   No results for input(s): LIPASE, AMYLASE in the last 168 hours. Recent Labs  Lab 09/29/23 1757  AMMONIA 18   Cardiac Enzymes: Recent Labs   Lab 09/29/23 1802 10/01/23 0300  CKTOTAL 938* 250*   BNP (last 3 results) No results for input(s): BNP in the last 8760 hours.  ProBNP (last 3 results) No results for input(s): PROBNP in the last 8760 hours.  CBG: Recent Labs  Lab 10/03/23 0756 10/03/23 1217 10/03/23 1700 10/03/23 2031 10/04/23 0757  GLUCAP 156* 267* 221* 291* 174*   No results found for this or any previous visit (from the past 240 hours).   Studies: No results found.    Bairon Klemann, MD  [facility] 10/04/2023  If 7PM-7AM, please contact night-coverage

## 2023-10-04 NOTE — TOC Progression Note (Signed)
 Transition of Care [facility]) - Progression Note   Patient Details  Name: Suzanne Duke MRN: [id number] Date of Birth: 03/23/1960  Transition of Care [facility]) CM/SW Contact  [name], LCSW Phone Number: 10/04/2023, 1:35 PM  Clinical Narrative: CSW followed up with husband regarding patient's insurance card. Per husband, he has not be able to locate the patient's insurance card yet but will bring the card to the hospital once he finds it. CSW updated by financial counselor that [employer] currently has the referral for Medicaid under review. Care management to follow.  Expected Discharge Plan: Skilled Nursing Facility Barriers to Discharge: SNF Pending bed offer, Inadequate or no insurance, Family Issues  Expected Discharge Plan and Services In-house Referral: Clinical Social Work Discharge Planning Services: CM Consult Post Acute Care Choice: Skilled Nursing Facility Living arrangements for the past 2 months: Single Family Home           DME Arranged: N/A DME Agency: NA HH Arranged: NA HH Agency: NA  Social Drivers of Health (SDOH) Interventions SDOH Screenings   Food Insecurity: Patient Unable To Answer (09/30/2023)  Transportation Needs: Patient Unable To Answer (09/30/2023)  Depression (PHQ2-9): Low Risk  (04/12/2021)  Tobacco Use: High Risk (09/29/2023)   Readmission Risk Interventions    10/01/2023   10:07 AM  Readmission Risk Prevention Plan  Post Dischage Appt Complete  Medication Screening Complete  Transportation Screening Complete

## 2023-10-04 NOTE — Progress Notes (Signed)
 Occupational Therapy Treatment Patient Details Name: Suzanne Duke MRN: [id number] DOB: 10/01/1960 Today's Date: 10/04/2023   History of present illness Patient is a 63 y/o female admitted 09/29/23 from home after spouse found her down on her porch soiled in urine and bugs crawling on her. Found to be dehydrated, confused, in DKA and with hypernatremia.  PMH positive for HNT, DM, GERD, anxiety, depression, s/p bariatric surgery, IDA, HASH, thyroid  goiter.   OT comments  Pt making good progress towards OT goals. Pt able to progress BSC/recliner transfers using RW with CGA-Min A though requiring up to Max A for LB ADLs d/t unknown bowel incontinence. Pt continues to have decreased insight into situation and deficits; responding well to sequencing cues for ADLs today. Continue to recommend postacute rehab stay unless family able to provide increased support/supervision.      If plan is discharge home, recommend the following:  A little help with walking and/or transfers;A little help with bathing/dressing/bathroom;Assistance with cooking/housework;Direct supervision/assist for medications management;Direct supervision/assist for financial management;Assist for transportation;Supervision due to cognitive status   Equipment Recommendations  Other (comment) (TBD)    Recommendations for Other Services      Precautions / Restrictions Precautions Precautions: Fall Recall of Precautions/Restrictions: Impaired Restrictions Weight Bearing Restrictions Per Provider Order: No       Mobility Bed Mobility Overal bed mobility: Needs Assistance Bed Mobility: Supine to Sit     Supine to sit: Supervision, HOB elevated, Used rails          Transfers Overall transfer level: Needs assistance Equipment used: Rolling walker (2 wheels) Transfers: Sit to/from Stand, Bed to chair/wheelchair/BSC Sit to Stand: Contact guard assist     Step pivot transfers: Min assist     General transfer comment:  Able to stand from bedside and BSC with RW with CGA. use of RW to step to [facility] with Min A, from [facility] to recliner, pt stepping around RW with min A to assist with DME     Balance Overall balance assessment: Needs assistance Sitting-balance support: No upper extremity supported, Feet supported Sitting balance-Leahy Scale: Good     Standing balance support: Bilateral upper extremity supported, During functional activity Standing balance-Leahy Scale: Fair                             ADL either performed or assessed with clinical judgement   ADL Overall ADL's : Needs assistance/impaired Eating/Feeding: Independent;Sitting   Grooming: Set up;Sitting;Wash/dry face   Upper Body Bathing: Set up;Sitting   Lower Body Bathing: Moderate assistance;Sitting/lateral leans;Sit to/from stand   Upper Body Dressing : Set up;Sitting       Toilet Transfer: Minimal assistance;Stand-pivot;Rolling walker (2 wheels);BSC/3in1   Toileting- Clothing Manipulation and Hygiene: Maximal assistance;Sitting/lateral lean;Sit to/from stand Toileting - Clothing Manipulation Details (indicate cue type and reason): assist for peri care in standing            Extremity/Trunk Assessment Upper Extremity Assessment Upper Extremity Assessment: Overall WFL for tasks assessed;Right hand dominant   Lower Extremity Assessment Lower Extremity Assessment: Defer to PT evaluation        Vision   Vision Assessment?: No apparent visual deficits   Perception     Praxis     Communication Communication Communication: No apparent difficulties   Cognition Arousal: Alert Behavior During Therapy: WFL for tasks assessed/performed Cognition: Cognition impaired   Orientation impairments: Situation, Time Awareness: Intellectual awareness impaired, Online awareness impaired Memory impairment (select all  impairments): Short-term memory, Declarative long-term memory Attention impairment (select first level of  impairment): Selective attention Executive functioning impairment (select all impairments): Reasoning, Problem solving OT - Cognition Comments: pleasant, good sequencing of ADLs. unaware of need for BM but able to redirect to [facility] to complete task. perseverating on having a big family with 17 siblings but difficulty specifying this info (how many brothers vs sisters, etc).                 Following commands: Impaired Following commands impaired: Only follows one step commands consistently, Follows multi-step commands with increased time      Cueing   Cueing Techniques: Verbal cues, Gestural cues  Exercises      Shoulder Instructions       General Comments      Pertinent Vitals/ Pain       Pain Assessment Pain Assessment: No/denies pain  Home Living                                          Prior Functioning/Environment              Frequency  Min 2X/week        Progress Toward Goals  OT Goals(current goals can now be found in the care plan section)  Progress towards OT goals: Progressing toward goals  Acute Rehab OT Goals Patient Stated Goal: be able to go home soon OT Goal Formulation: With patient Time For Goal Achievement: 10/16/23 Potential to Achieve Goals: Fair  Plan      Co-evaluation                 AM-PAC OT 6 Clicks Daily Activity     Outcome Measure   Help from another person eating meals?: A Little Help from another person taking care of personal grooming?: A Little Help from another person toileting, which includes using toliet, bedpan, or urinal?: A Lot Help from another person bathing (including washing, rinsing, drying)?: A Lot Help from another person to put on and taking off regular upper body clothing?: A Little Help from another person to put on and taking off regular lower body clothing?: A Lot 6 Click Score: 15    End of Session Equipment Utilized During Treatment: Rolling walker (2 wheels);Gait  belt  OT Visit Diagnosis: Unsteadiness on feet (R26.81);History of falling (Z91.81);Muscle weakness (generalized) (M62.81);Cognitive communication deficit (R41.841)   Activity Tolerance Patient tolerated treatment well   Patient Left in chair;with call bell/phone within reach;with chair alarm set   Nurse Communication Mobility status        Time: 8865-8842 OT Time Calculation (min): 23 min  Charges: OT General Charges $OT Visit: 1 Visit OT Treatments $Self Care/Home Management : 8-22 mins  Suzanne Duke, OTR/L Acute Rehab Services Office: [phone]   Suzanne Fish 10/04/2023, 12:22 PM

## 2023-10-05 ENCOUNTER — Encounter (HOSPITAL_COMMUNITY): Payer: Self-pay | Admitting: Family Medicine

## 2023-10-05 LAB — BASIC METABOLIC PANEL WITH GFR
Anion gap: 5 (ref 5–15)
BUN: 15 mg/dL (ref 8–23)
CO2: 27 mmol/L (ref 22–32)
Calcium: 7.8 mg/dL — ABNORMAL LOW (ref 8.9–10.3)
Chloride: 104 mmol/L (ref 98–111)
Creatinine, Ser: 0.6 mg/dL (ref 0.44–1.00)
GFR, Estimated: >60 mL/min (ref >60)
Glucose, Bld: 205 mg/dL — ABNORMAL HIGH (ref 70–99)
Potassium: 3.9 mmol/L (ref 3.5–5.1)
Sodium: 136 mmol/L (ref 135–145)

## 2023-10-05 LAB — CBC WITH DIFFERENTIAL/PLATELET
Abs Granulocyte: 3.4 K/uL (ref 1.5–6.5)
Abs Immature Granulocytes: 0.04 K/uL (ref 0.00–0.07)
Basophils Absolute: 0 K/uL (ref 0.0–0.1)
Basophils Relative: 0 %
Eosinophils Absolute: 0 K/uL (ref 0.0–0.5)
Eosinophils Relative: 1 %
HCT: 33.9 % — ABNORMAL LOW (ref 36.0–46.0)
Hemoglobin: 10 g/dL — ABNORMAL LOW (ref 12.0–15.0)
Immature Granulocytes: 1 %
Lymphocytes Relative: 29 %
Lymphs Abs: 1.6 K/uL (ref 0.7–4.0)
MCH: 27.1 pg (ref 26.0–34.0)
MCHC: 29.5 g/dL — ABNORMAL LOW (ref 30.0–36.0)
MCV: 91.9 fL (ref 80.0–100.0)
Monocytes Absolute: 0.6 K/uL (ref 0.1–1.0)
Monocytes Relative: 10 %
Neutro Abs: 3.4 K/uL (ref 1.7–7.7)
Neutrophils Relative %: 59 %
Platelets: 136 K/uL — ABNORMAL LOW (ref 150–400)
RBC: 3.69 MIL/uL — ABNORMAL LOW (ref 3.87–5.11)
RDW: 12.5 % (ref 11.5–15.5)
WBC: 5.8 K/uL (ref 4.0–10.5)
nRBC: 0 % (ref 0.0–0.2)

## 2023-10-05 LAB — GLUCOSE, CAPILLARY
Glucose-Capillary: 198 mg/dL — ABNORMAL HIGH (ref 70–99)
Glucose-Capillary: 249 mg/dL — ABNORMAL HIGH (ref 70–99)
Glucose-Capillary: 229 mg/dL — ABNORMAL HIGH (ref 70–99)
Glucose-Capillary: 188 mg/dL — ABNORMAL HIGH (ref 70–99)

## 2023-10-05 LAB — VITAMIN B1: Vitamin B1 (Thiamine): 57.2 nmol/L — ABNORMAL LOW (ref 66.5–200.0)

## 2023-10-05 LAB — MAGNESIUM: Magnesium: 1.6 mg/dL — ABNORMAL LOW (ref 1.7–2.4)

## 2023-10-05 MED ORDER — MAGNESIUM SULFATE 2 GM/50ML IV SOLN
2.0000 g | Freq: Once | INTRAVENOUS | Status: AC
  Administered 2023-10-05: 2 g via INTRAVENOUS
  Filled 2023-10-05: qty 50

## 2023-10-05 NOTE — Progress Notes (Signed)
 PROGRESS NOTE  Suzanne Duke FMW:[id number] DOB: Jun 02, 1960 DOA: 09/29/2023 PCP: Levora Reyes [name], MD   LOS: 6 days   Brief narrative:  Suzanne Duke is a 63 year old female with past medical history of DM II, HTN, GERD, anxiety/depression, bariatric surgery, IDA, NASH, thyroid  goiter presented to hospital from home after being found on her porch for unknown amount of time.  She was seen by her neighbor and EMS was called thin.  In the ED, patient was grossly confused.  It was reported that her husband moved out a few months prior due to change in her behavior and aggression.  Patient was noted to be in DKA and was started on IV fluids and insulin  drip.  She was also noted to have significant hypernatremia in the setting of volume depletion and dehydration.  Patient was then admitted hospital for further evaluation and treatment.   Assessment/Plan: Active Problems:   Hypernatremia   Acute metabolic encephalopathy   Normocytic anemia   Unsatisfactory living conditions   Rhabdomyolysis   Essential hypertension   GOITER, NONTOXIC MULTINODULAR   Elevated troponin  DKA, type 2 with mild hyperglycemia Resolved after insulin  drip.  Currently on basal insulin /sliding scale insulin , diabetic diet.  Will continue to monitor closely.  Glycemic control adequate at this time.  Latest POC glucose of 198    Hypernatremia Received IV fluid hydration with improvement of sodium to 136 at this time.  Off IV fluids.  Encourage oral hydration.   Normocytic anemia Hemoglobin today at 11.7.  Initially hemodiluted. Folate low, at 3 and starting on folic acid  supplement. B12 level low/normal 390 continue vitamin B12 supplementation.  Acute metabolic encephalopathy with possible underlying dementia. - Unclear what her baseline is and no family able to provide collateral information.  Unable to reach patient's husband.  MRI of the brain with parenchymal loss.  Continue thiamine .  Pending vitamin B1 level. ammonia  within normal limits.  Likely has underlying dementia.  Patient is oriented to self only but does not seem to be in delirium.   Rhabdomyolysis - Initial CK9 938 on admission.  On IV fluids.  Latest CK level of 250.  Improved after hydration.  Received LR.  Hypomagnesemia.  Will replenish through IV.  Unsatisfactory living conditions Continue TOC assistance.   Essential hypertension Not on medications for months now.   Nontoxic multinodular goiter. Prior treatment by endocrinology with stable ultrasound previously.  TSH  suppressed, 0.274.  Free T4 at 1.0.   Elevated troponin Flat troponins.   EKG with sinus rhythm.  No acute issues at this time.  Hyperkalemia followed by mild hypokalemia improved.  Latest potassium of 4.0   DVT prophylaxis: SCDs Start: 09/29/23 1659   Disposition: Pending placement.  Unable to reach the patient's husband despite multiple attempts on 10/05/2023.  APS case as per TOC.  Medically stable for disposition  Status is: Inpatient Remains inpatient appropriate because: Pending placement, awaiting safe disposition    Code Status:     Code Status: Full Code  Family Communication: None available  Consultants: None  Procedures: None  Anti-infectives:  None  Anti-infectives (From admission, onward)    None      Subjective: Today, patient was seen and examined at bedside.  Patient denies any nausea vomiting fever chills shortness of breath or dyspnea.  Objective: Vitals:   10/04/23 1951 10/05/23 0533  BP: 128/68 110/63  Pulse: 83 74  Resp: 17 14  Temp: 98.5 F (36.9 C) 98.2 F (36.8 C)  SpO2:  100% 100%    Intake/Output Summary (Last 24 hours) at 10/05/2023 0957 Last data filed at 10/04/2023 2100 Gross per 24 hour  Intake 240 ml  Output 900 ml  Net -660 ml   Filed Weights   09/29/23 1228 09/29/23 1750  Weight: 81.6 kg 79.8 kg   Body mass index is 27.55 kg/m.   Physical Exam: General:  Average built, not in obvious distress  alert awake and oriented to self, Communicative, HENT:   No scleral pallor or icterus noted. Oral mucosa is moist.  Chest:  Clear breath sounds. No crackles or wheezes.  CVS: S1 &S2 heard. No murmur.  Regular rate and rhythm. Abdomen: Soft, nontender, nondistended.  Bowel sounds are heard.   Extremities: No cyanosis, clubbing or edema.  Peripheral pulses are palpable. Psych: Alert, awake and oriented to self, cognitive dysfunction, normal mood CNS:  No cranial nerve deficits.  Power equal in all extremities.   Skin: Warm and dry.  No rashes noted.   Data Review: I have personally reviewed the following laboratory data and studies,  CBC: Recent Labs  Lab 10/01/23 0800 10/02/23 0409 10/03/23 0354 10/04/23 0404 10/05/23 0437  WBC 8.3 7.9 7.2 6.7 5.8  NEUTROABS 6.1 5.8 4.9 4.6 3.4  HGB 9.4* 11.6* 11.3* 11.7* 10.0*  HCT 32.9* 40.2 36.7 39.8 33.9*  MCV 99.7 94.1 91.8 92.6 91.9  PLT 84* 105* 110* 115* 136*   Basic Metabolic Panel: Recent Labs  Lab 10/01/23 0300 10/02/23 0409 10/03/23 0354 10/04/23 0404 10/05/23 0437  NA 146* 141 143 137 136  K 3.8 3.4* 3.5 4.0 3.9  CL 114* 106 107 102 104  CO2 17* 27 29 27 27   GLUCOSE 111* 179* 150* 187* 205*  BUN 21 16 12 16 15   CREATININE 0.32* 0.54 0.53 0.66 0.60  CALCIUM 7.4* 7.7* 8.0* 7.9* 7.8*  MG 1.1* 1.8 1.5* 1.6* 1.6*   Liver Function Tests: Recent Labs  Lab 09/29/23 1234 09/30/23 0240 10/01/23 0300  AST 33 36 20  ALT 55* 48* 21  ALKPHOS 80 67 34*  BILITOT 1.8* 0.9 0.7  PROT 6.3* 5.7* 3.0*  ALBUMIN  3.4* 2.9* <1.5*   No results for input(s): LIPASE, AMYLASE in the last 168 hours. Recent Labs  Lab 09/29/23 1757  AMMONIA 18   Cardiac Enzymes: Recent Labs  Lab 09/29/23 1802 10/01/23 0300  CKTOTAL 938* 250*   BNP (last 3 results) No results for input(s): BNP in the last 8760 hours.  ProBNP (last 3 results) No results for input(s): PROBNP in the last 8760 hours.  CBG: Recent Labs  Lab 10/04/23 0757  10/04/23 1139 10/04/23 1616 10/04/23 2017 10/05/23 0751  GLUCAP 174* 265* 235* 286* 198*   No results found for this or any previous visit (from the past 240 hours).   Studies: No results found.    Rafeal Skibicki, MD  [facility] 10/05/2023  If 7PM-7AM, please contact night-coverage

## 2023-10-06 LAB — GLUCOSE, CAPILLARY
Glucose-Capillary: 164 mg/dL — ABNORMAL HIGH (ref 70–99)
Glucose-Capillary: 259 mg/dL — ABNORMAL HIGH (ref 70–99)
Glucose-Capillary: 196 mg/dL — ABNORMAL HIGH (ref 70–99)
Glucose-Capillary: 232 mg/dL — ABNORMAL HIGH (ref 70–99)

## 2023-10-06 LAB — BASIC METABOLIC PANEL WITH GFR
Anion gap: 10 (ref 5–15)
BUN: 9 mg/dL (ref 8–23)
CO2: 26 mmol/L (ref 22–32)
Calcium: 8.3 mg/dL — ABNORMAL LOW (ref 8.9–10.3)
Chloride: 102 mmol/L (ref 98–111)
Creatinine, Ser: 0.48 mg/dL (ref 0.44–1.00)
GFR, Estimated: >60 mL/min (ref >60)
Glucose, Bld: 177 mg/dL — ABNORMAL HIGH (ref 70–99)
Potassium: 4 mmol/L (ref 3.5–5.1)
Sodium: 138 mmol/L (ref 135–145)

## 2023-10-06 NOTE — Progress Notes (Signed)
 PROGRESS NOTE  Suzanne Duke FMW:[id number] DOB: 06/22/1960 DOA: 09/29/2023 PCP: Levora Reyes [name], MD   LOS: 7 days   Brief narrative:  Suzanne Duke is a 63 year old female with past medical history of DM II, HTN, GERD, anxiety/depression, bariatric surgery, IDA, NASH, thyroid  goiter presented to hospital from home after being found on her porch for unknown amount of time.  She was seen by her neighbor and EMS was called thin.  In the ED, patient was grossly confused.  It was reported that her husband moved out a few months prior due to change in her behavior and aggression.  Patient was noted to be in DKA and was started on IV fluids and insulin  drip.  She was also noted to have significant hypernatremia in the setting of volume depletion and dehydration.  Patient was then admitted hospital for further evaluation and treatment.   Assessment/Plan: Active Problems:   Hypernatremia   Acute metabolic encephalopathy   Normocytic anemia   Unsatisfactory living conditions   Rhabdomyolysis   Essential hypertension   GOITER, NONTOXIC MULTINODULAR   Elevated troponin  DKA, type 2 with mild hyperglycemia Resolved after insulin  drip.  Currently on basal insulin /sliding scale insulin , diabetic diet.  Will continue to monitor closely.  Glycemic control adequate at this time.  Latest POC glucose of 164    Hypernatremia Received IV fluid hydration with improvement of sodium to 138 at this time.  Off IV fluids.  Encourage oral hydration.   Normocytic anemia Latest hemoglobin at 11.7.  Initially hemodiluted. Folate low, at 3 and starting on folic acid  supplement. B12 level low/normal 390 continue vitamin B12 supplementation.  Acute metabolic encephalopathy with possible underlying dementia. - Unclear what her baseline is and no family able to provide collateral information.  Unable to reach patient's husband.  MRI of the brain with parenchymal loss.  Continue thiamine  with low vitamin B1 level.  Ammonia  within normal limits.  Likely has underlying dementia.  Patient is oriented to self only.  Disoriented otherwise.   Rhabdomyolysis - Initial CK9 938 on admission.  On IV fluids.  Latest CK level of 250.  Improved after hydration.  Received LR.  Hypomagnesemia.  Replenished yesterday.  Check levels in AM.  Unsatisfactory living conditions Continue TOC assistance.   Essential hypertension Not on medications for months now.   Nontoxic multinodular goiter. Prior treatment by endocrinology with stable ultrasound previously.  TSH  suppressed, 0.274.  Free T4 at 1.0.   Elevated troponin Flat troponins.   EKG with sinus rhythm.  No acute issues at this time.  Hyperkalemia followed by mild hypokalemia improved.  Latest potassium of 4.0   DVT prophylaxis: SCDs Start: 09/29/23 1659   Disposition: Pending placement.  Unable to reach the patient's husband despite multiple attempts on 10/05/2023.  APS case as per TOC.  Medically stable for disposition  Status is: Inpatient Remains inpatient appropriate because: Pending placement, awaiting safe disposition    Code Status:     Code Status: Full Code  Family Communication: None available  Consultants: None  Procedures: None  Anti-infectives:  None  Anti-infectives (From admission, onward)    None      Subjective: Today, patient was seen and examined at bedside.  Patient denies any interval complaints.  No nausea vomiting fever chills or rigor.  Objective: Vitals:   10/05/23 2112 10/06/23 0432  BP: (!) 149/71 (!) 128/59  Pulse: 87 72  Resp: 20 18  Temp: 98.7 F (37.1 C) 98.2 F (36.8 C)  SpO2: 100% 100%    Intake/Output Summary (Last 24 hours) at 10/06/2023 1033 Last data filed at 10/06/2023 0845 Gross per 24 hour  Intake 240 ml  Output 2300 ml  Net -2060 ml   Filed Weights   09/29/23 1228 09/29/23 1750  Weight: 81.6 kg 79.8 kg   Body mass index is 27.55 kg/m.   Physical Exam:  General:  Average built, not  in obvious distress, oriented to self HENT:   No scleral pallor or icterus noted. Oral mucosa is moist.  Chest:  Clear breath sounds.   No crackles or wheezes.  CVS: S1 &S2 heard. No murmur.  Regular rate and rhythm. Abdomen: Soft, nontender, nondistended.  Bowel sounds are heard.   Extremities: No cyanosis, clubbing or edema.  Peripheral pulses are palpable. Psych: Alert, awake and Communicative.  Cognitive dysfunction. CNS:  No cranial nerve deficits.  Power equal in all extremities.   Skin: Warm and dry.  No rashes noted.    Data Review: I have personally reviewed the following laboratory data and studies,  CBC: Recent Labs  Lab 10/01/23 0800 10/02/23 0409 10/03/23 0354 10/04/23 0404 10/05/23 0437  WBC 8.3 7.9 7.2 6.7 5.8  NEUTROABS 6.1 5.8 4.9 4.6 3.4  HGB 9.4* 11.6* 11.3* 11.7* 10.0*  HCT 32.9* 40.2 36.7 39.8 33.9*  MCV 99.7 94.1 91.8 92.6 91.9  PLT 84* 105* 110* 115* 136*   Basic Metabolic Panel: Recent Labs  Lab 10/01/23 0300 10/02/23 0409 10/03/23 0354 10/04/23 0404 10/05/23 0437 10/06/23 0347  NA 146* 141 143 137 136 138  K 3.8 3.4* 3.5 4.0 3.9 4.0  CL 114* 106 107 102 104 102  CO2 17* 27 29 27 27 26   GLUCOSE 111* 179* 150* 187* 205* 177*  BUN 21 16 12 16 15 9   CREATININE 0.32* 0.54 0.53 0.66 0.60 0.48  CALCIUM 7.4* 7.7* 8.0* 7.9* 7.8* 8.3*  MG 1.1* 1.8 1.5* 1.6* 1.6*  --    Liver Function Tests: Recent Labs  Lab 09/29/23 1234 09/30/23 0240 10/01/23 0300  AST 33 36 20  ALT 55* 48* 21  ALKPHOS 80 67 34*  BILITOT 1.8* 0.9 0.7  PROT 6.3* 5.7* 3.0*  ALBUMIN  3.4* 2.9* <1.5*   No results for input(s): LIPASE, AMYLASE in the last 168 hours. Recent Labs  Lab 09/29/23 1757  AMMONIA 18   Cardiac Enzymes: Recent Labs  Lab 09/29/23 1802 10/01/23 0300  CKTOTAL 938* 250*   BNP (last 3 results) No results for input(s): BNP in the last 8760 hours.  ProBNP (last 3 results) No results for input(s): PROBNP in the last 8760  hours.  CBG: Recent Labs  Lab 10/05/23 0751 10/05/23 1223 10/05/23 1654 10/05/23 2114 10/06/23 0747  GLUCAP 198* 249* 229* 188* 164*   No results found for this or any previous visit (from the past 240 hours).   Studies: No results found.    Bunny Kleist, MD  [facility] 10/06/2023  If 7PM-7AM, please contact night-coverage

## 2023-10-07 LAB — GLUCOSE, CAPILLARY
Glucose-Capillary: 162 mg/dL — ABNORMAL HIGH (ref 70–99)
Glucose-Capillary: 178 mg/dL — ABNORMAL HIGH (ref 70–99)
Glucose-Capillary: 179 mg/dL — ABNORMAL HIGH (ref 70–99)
Glucose-Capillary: 231 mg/dL — ABNORMAL HIGH (ref 70–99)
Glucose-Capillary: 309 mg/dL — ABNORMAL HIGH (ref 70–99)

## 2023-10-07 MED ORDER — POLYETHYLENE GLYCOL 3350 17 G PO PACK
17.0000 g | PACK | Freq: Every day | ORAL | Status: DC | PRN
  Administered 2023-10-07 – 2023-10-12 (×3): 17 g via ORAL
  Filled 2023-10-07 (×3): qty 1

## 2023-10-07 MED ORDER — DOCUSATE SODIUM 100 MG PO CAPS
100.0000 mg | ORAL_CAPSULE | Freq: Two times a day (BID) | ORAL | Status: DC
  Administered 2023-10-07 – 2023-10-14 (×20): 100 mg via ORAL
  Filled 2023-10-07 (×15): qty 1

## 2023-10-07 NOTE — Progress Notes (Signed)
 Physical Therapy Treatment Patient Details Name: Suzanne Duke MRN: [id number] DOB: 1960/08/31 Today's Date: 10/07/2023   History of Present Illness Patient is a 63 y/o female admitted 09/29/23 from home after spouse found her down on her porch soiled in urine and bugs crawling on her. Found to be dehydrated, confused, in DKA and with hypernatremia.  PMH positive for HNT, DM, GERD, anxiety, depression, s/p bariatric surgery, IDA, HASH, thyroid  goiter.    PT Comments  AxO x 2 pleasantly confused requiring repeat instructions and increased time to process.  Easily distracted.  Engaging in conversation but unclear dates, times events. Assisted from bed to [facility] to find Pt impacted and c/o increased rectal pain.  Allowed increased time seated on BSC to attempt to have a BM.  After approx 12 min, Pt has little results.  Assisted with amb in hallway.  General Gait Details: + 2 assist such that recliner was following.  Limited amb distance and repeat VC's for proper walker to self distance and safety with turns. Pt pleasantly confused at times.  Required VC's to use walker when about to exit the bathroom.  Secure chat sent to RN, Pt still impacted despite extended seated time on BCS and toilet. LPT has rec Pt will need ST Rehab at SNF to address mobility and functional decline prior to safely returning home.    If plan is discharge home, recommend the following: A lot of help with bathing/dressing/bathroom;A lot of help with walking and/or transfers;Assistance with cooking/housework;Assist for transportation;Help with stairs or ramp for entrance   Can travel by private vehicle     No  Equipment Recommendations  Rolling walker (2 wheels)    Recommendations for Other Services       Precautions / Restrictions Precautions Precautions: Fall Recall of Precautions/Restrictions: Impaired Restrictions Weight Bearing Restrictions Per Provider Order: No     Mobility  Bed Mobility Overal bed mobility:  Needs Assistance Bed Mobility: Supine to Sit     Supine to sit: Supervision, HOB elevated, Used rails     General bed mobility comments: repeat VCs and increased time to complete task as well as increased assist to scoot to EOB    Transfers Overall transfer level: Needs assistance Equipment used: Rolling walker (2 wheels) Transfers: Sit to/from Stand, Bed to chair/wheelchair/BSC Sit to Stand: Contact guard assist, Min assist           General transfer comment: Able to stand from bedside and BSC with RW with CGA. use of RW to step to [facility] with Min A.  Also assisted with a toilet transfer in which Pt required increased assist to rise due to lower level.    Ambulation/Gait Ambulation/Gait assistance: Min assist, Mod assist Gait Distance (Feet): 43 Feet Assistive device: Rolling walker (2 wheels) Gait Pattern/deviations: Step-through pattern, Step-to pattern, Wide base of support, Decreased stride length Gait velocity: decreased     General Gait Details: + 2 assist such that recliner was following.  Limited amb distance and repeat VC's for proper walker to self distance and safety with turns.   Stairs             Wheelchair Mobility     Tilt Bed    Modified Rankin (Stroke Patients Only)       Balance  Communication Communication Communication: No apparent difficulties  Cognition Arousal: Alert Behavior During Therapy: WFL for tasks assessed/performed   PT - Cognitive impairments: Orientation, Memory, Problem solving, Safety/Judgement                       PT - Cognition Comments: AxO x 2 pleasantly confused requiring repeat instructions and increased time to process.  Easily distracted.  Engaging in conversation but unclear dates, times events. Following commands: Impaired Following commands impaired: Only follows one step commands consistently, Follows multi-step commands with  increased time    Cueing Cueing Techniques: Verbal cues, Gestural cues  Exercises      General Comments        Pertinent Vitals/Pain Pain Assessment Pain Assessment: Faces Pain Location: rectum Pain Descriptors / Indicators: Grimacing    Home Living                          Prior Function            PT Goals (current goals can now be found in the care plan section) Progress towards PT goals: Progressing toward goals    Frequency    Min 2X/week      PT Plan      Co-evaluation              AM-PAC PT 6 Clicks Mobility   Outcome Measure  Help needed turning from your back to your side while in a flat bed without using bedrails?: A Lot Help needed moving from lying on your back to sitting on the side of a flat bed without using bedrails?: A Lot Help needed moving to and from a bed to a chair (including a wheelchair)?: A Lot Help needed standing up from a chair using your arms (e.g., wheelchair or bedside chair)?: A Lot Help needed to walk in hospital room?: A Lot Help needed climbing 3-5 steps with a railing? : Total 6 Click Score: 11    End of Session Equipment Utilized During Treatment: Gait belt Activity Tolerance: Patient limited by fatigue Patient left: in chair;with call bell/phone within reach;with chair alarm set Nurse Communication: Mobility status PT Visit Diagnosis: Other abnormalities of gait and mobility (R26.89);Muscle weakness (generalized) (M62.81);History of falling (Z91.81);Other symptoms and signs involving the nervous system (R29.898)     Time: 1132-1219 PT Time Calculation (min) (ACUTE ONLY): 47 min  Charges:    $Gait Training: 8-22 mins $Therapeutic Activity: 23-37 mins PT General Charges $$ ACUTE PT VISIT: 1 Visit                     Katheryn Leap  PTA Acute  Rehabilitation Services Office M-F          [phone]

## 2023-10-07 NOTE — Plan of Care (Signed)
  Problem: Metabolic: Goal: Ability to maintain appropriate glucose levels will improve Outcome: Progressing   Problem: Nutritional: Goal: Maintenance of adequate nutrition will improve Outcome: Progressing   Problem: Pain Managment: Goal: General experience of comfort will improve and/or be controlled Outcome: Progressing   Problem: Safety: Goal: Ability to remain free from injury will improve Outcome: Progressing

## 2023-10-07 NOTE — Inpatient Diabetes Management (Signed)
 Inpatient Diabetes Program Recommendations  AACE/ADA: New Consensus Statement on Inpatient Glycemic Control   Target Ranges:  Prepandial:   less than 140 mg/dL      Peak postprandial:   less than 180 mg/dL (1-2 hours)      Critically ill patients:  140 - 180 mg/dL    Latest Reference Range & Units 10/06/23 07:47 10/06/23 11:39 10/06/23 16:58 10/06/23 20:08 10/07/23 00:36 10/07/23 07:58  Glucose-Capillary 70 - 99 mg/dL 835 (H) 740 (H) 803 (H) 232 (H) 179 (H) 178 (H)   Review of Glycemic Control  Current orders for Inpatient glycemic control: Semglee  10 units daily, Novolog  0-15 units TID with meals, Novolog  0-5 units QHS  Inpatient Diabetes Program Recommendations:    Insulin : Post prandial glucose consistently elevated.  Please consider ordering Novolog  3 units TID with meals for meal coverage if patient eats at least 50% of meals.  Thanks, Earnie Gainer, RN, MSN, CDCES Diabetes Coordinator Inpatient Diabetes Program [phone] (Team Pager from 8am to 5pm)

## 2023-10-07 NOTE — Progress Notes (Signed)
 PROGRESS NOTE  Suzanne Duke FMW:[id number] DOB: 1960-05-07 DOA: 09/29/2023 PCP: Levora Reyes [name], MD   LOS: 8 days   Brief narrative:  Suzanne Duke is a 63 year old female with past medical history of DM II, HTN, GERD, anxiety/depression, bariatric surgery, IDA, NASH, thyroid  goiter presented to hospital from home after being found on her porch for unknown amount of time.  She was seen by her neighbor and EMS was called thin.  In the ED, patient was grossly confused.  It was reported that her husband moved out a few months prior due to change in her behavior and aggression.  Patient was noted to be in DKA and was started on IV fluids and insulin  drip.  She was also noted to have significant hypernatremia in the setting of volume depletion and dehydration.  Patient was then admitted hospital for further evaluation and treatment.   Assessment/Plan: Active Problems:   Hypernatremia   Acute metabolic encephalopathy   Normocytic anemia   Unsatisfactory living conditions   Rhabdomyolysis   Essential hypertension   GOITER, NONTOXIC MULTINODULAR   Elevated troponin  DKA, type 2 with mild hyperglycemia Resolved after insulin  drip.  Currently on basal insulin /sliding scale insulin , diabetic diet.  Will continue to monitor closely.  Glycemic control adequate at this time.  Latest POC glucose of 231.    Hypernatremia Received IV fluid hydration with improvement of sodium to 138 at this time.  Off IV fluids.  Encourage oral hydration.   Normocytic anemia Latest hemoglobin at 10.0 initially hemodiluted. Folate low, at 3 and starting on folic acid  supplement. B12 level low/normal 390 continue vitamin B12 supplementation.  Acute metabolic encephalopathy with possible underlying dementia. - Unclear what her baseline is and no family able to provide collateral information.  Unable to reach patient's husband.  MRI of the brain with parenchymal loss.  Continue thiamine  with low vitamin B1 level.  Ammonia  within normal limits.  Likely has underlying dementia.  Patient is oriented to self only.  Disoriented otherwise.  Vitamin B1 deficiency.  Continue thiamine  orally.   Rhabdomyolysis - Initial CK9 938 on admission.  On IV fluids.  Latest CK level of 250.  Improved after hydration.  Received LR.  Hypomagnesemia.  Replenished, continue magnesium  oxide  Unsatisfactory living conditions Continue TOC assistance.   Essential hypertension Not on medications for months now.   Nontoxic multinodular goiter. Prior treatment by endocrinology with stable ultrasound previously.  TSH  suppressed, 0.274.  Free T4 at 1.0.   Elevated troponin Flat troponins.   EKG with sinus rhythm.  No acute issues at this time.  Hyperkalemia followed by mild hypokalemia improved.  Latest potassium of 4.0   DVT prophylaxis: SCDs Start: 09/29/23 1659   Disposition: Pending placement.  Unable to reach the patient's husband despite multiple attempts on 10/05/2023.  [facility] case as per TOC.  Medically stable for disposition  Status is: Inpatient Remains inpatient appropriate because: Pending placement, awaiting safe disposition    Code Status:     Code Status: Full Code  Family Communication: None available  Consultants: None  Procedures: None  Anti-infectives:  None  Anti-infectives (From admission, onward)    None      Subjective: Today, patient was seen and examined at bedside.  Patient denies interval complaints including fever chills nausea vomiting shortness of breath or dyspnea.  Patient is disoriented but Communicative.  De Objective: Vitals:   10/07/23 0524 10/07/23 1215  BP: (!) 125/55 (!) 156/89  Pulse: 70 90  Resp:  20 15  Temp: 98.6 F (37 C) 98.3 F (36.8 C)  SpO2: 100% 100%    Intake/Output Summary (Last 24 hours) at 10/07/2023 1301 Last data filed at 10/06/2023 2309 Gross per 24 hour  Intake --  Output 1500 ml  Net -1500 ml   Filed Weights   09/29/23 1228 09/29/23 1750   Weight: 81.6 kg 79.8 kg   Body mass index is 27.55 kg/m.   Physical Exam:  General:  Average built, not in obvious distress, oriented to self HENT:   No scleral pallor or icterus noted. Oral mucosa is moist.  Chest:  Clear breath sounds.   No crackles or wheezes.  CVS: S1 &S2 heard. No murmur.  Regular rate and rhythm. Abdomen: Soft, nontender, nondistended.  Bowel sounds are heard.   Extremities: No cyanosis, clubbing or edema.  Peripheral pulses are palpable. Psych: Alert, awake and Communicative.  cognitive dysfunction with disorientation. CNS:  No cranial nerve deficits.  Power equal in all extremities.   Skin: Warm and dry.  No rashes noted.  Data Review: I have personally reviewed the following laboratory data and studies,  CBC: Recent Labs  Lab 10/01/23 0800 10/02/23 0409 10/03/23 0354 10/04/23 0404 10/05/23 0437  WBC 8.3 7.9 7.2 6.7 5.8  NEUTROABS 6.1 5.8 4.9 4.6 3.4  HGB 9.4* 11.6* 11.3* 11.7* 10.0*  HCT 32.9* 40.2 36.7 39.8 33.9*  MCV 99.7 94.1 91.8 92.6 91.9  PLT 84* 105* 110* 115* 136*   Basic Metabolic Panel: Recent Labs  Lab 10/01/23 0300 10/02/23 0409 10/03/23 0354 10/04/23 0404 10/05/23 0437 10/06/23 0347  NA 146* 141 143 137 136 138  K 3.8 3.4* 3.5 4.0 3.9 4.0  CL 114* 106 107 102 104 102  CO2 17* 27 29 27 27 26   GLUCOSE 111* 179* 150* 187* 205* 177*  BUN 21 16 12 16 15 9   CREATININE 0.32* 0.54 0.53 0.66 0.60 0.48  CALCIUM 7.4* 7.7* 8.0* 7.9* 7.8* 8.3*  MG 1.1* 1.8 1.5* 1.6* 1.6*  --    Liver Function Tests: Recent Labs  Lab 10/01/23 0300  AST 20  ALT 21  ALKPHOS 34*  BILITOT 0.7  PROT 3.0*  ALBUMIN  <1.5*   No results for input(s): LIPASE, AMYLASE in the last 168 hours. No results for input(s): AMMONIA in the last 168 hours.  Cardiac Enzymes: Recent Labs  Lab 10/01/23 0300  CKTOTAL 250*   BNP (last 3 results) No results for input(s): BNP in the last 8760 hours.  ProBNP (last 3 results) No results for input(s):  PROBNP in the last 8760 hours.  CBG: Recent Labs  Lab 10/06/23 1658 10/06/23 2008 10/07/23 0036 10/07/23 0758 10/07/23 1214  GLUCAP 196* 232* 179* 178* 231*   No results found for this or any previous visit (from the past 240 hours).   Studies: No results found.    Wren Pryce, MD  [facility] 10/07/2023  If 7PM-7AM, please contact night-coverage

## 2023-10-08 LAB — GLUCOSE, CAPILLARY
Glucose-Capillary: 164 mg/dL — ABNORMAL HIGH (ref 70–99)
Glucose-Capillary: 278 mg/dL — ABNORMAL HIGH (ref 70–99)
Glucose-Capillary: 226 mg/dL — ABNORMAL HIGH (ref 70–99)
Glucose-Capillary: 271 mg/dL — ABNORMAL HIGH (ref 70–99)

## 2023-10-08 MED ORDER — INSULIN ASPART 100 UNIT/ML IJ SOLN
2.0000 [IU] | Freq: Three times a day (TID) | INTRAMUSCULAR | Status: DC
  Administered 2023-10-08 – 2023-10-14 (×21): 2 [IU] via SUBCUTANEOUS

## 2023-10-08 NOTE — TOC Progression Note (Signed)
 Transition of Care [facility]) - Progression Note   Patient Details  Name: Suzanne Duke MRN: [id number] Date of Birth: 03/23/1960  [facility] of [facility] CM/SW Contact  [name], LCSW Phone Number: 10/08/2023, 10:43 AM  Clinical Narrative: Husband has not found patient's insurance card. CSW discussed case with [facility] supervisor and patient will not be able to go to a SNF until patient's insurance changes fro Medicaid potential to Medicaid pending and patient must have a safe discharge plan following rehab, which she currently does not have. Care management to follow.  Expected Discharge Plan: Skilled Nursing Facility Barriers to Discharge: SNF Pending bed offer, Inadequate or no insurance, Family Issues  Expected Discharge Plan and Services In-house Referral: Clinical Social Work Discharge Planning Services: CM Consult Post Acute Care Choice: Skilled Nursing Facility Living arrangements for the past 2 months: Single Family Home             DME Arranged: N/A DME Agency: NA HH Arranged: NA HH Agency: NA  Social Drivers of Health (SDOH) Interventions SDOH Screenings   Food Insecurity: Patient Unable To Answer (09/30/2023)  Transportation Needs: Patient Unable To Answer (09/30/2023)  Depression (PHQ2-9): Low Risk  (04/12/2021)  Tobacco Use: High Risk (10/05/2023)   Readmission Risk Interventions    10/01/2023   10:07 AM  Readmission Risk Prevention Plan  Post Dischage Appt Complete  Medication Screening Complete  Transportation Screening Complete

## 2023-10-08 NOTE — Plan of Care (Signed)
  Problem: Education: Goal: Ability to describe self-care measures that may prevent or decrease complications (Diabetes Survival Skills Education) will improve Outcome: Progressing Goal: Individualized Educational Video(s) Outcome: Progressing   Problem: Coping: Goal: Ability to adjust to condition or change in health will improve Outcome: Progressing   Problem: Fluid Volume: Goal: Ability to maintain a balanced intake and output will improve Outcome: Progressing   Problem: Health Behavior/Discharge Planning: Goal: Ability to identify and utilize available resources and services will improve Outcome: Progressing Goal: Ability to manage health-related needs will improve Outcome: Progressing   Problem: Metabolic: Goal: Ability to maintain appropriate glucose levels will improve Outcome: Progressing   Problem: Nutritional: Goal: Maintenance of adequate nutrition will improve Outcome: Progressing Goal: Progress toward achieving an optimal weight will improve Outcome: Progressing   Problem: Tissue Perfusion: Goal: Adequacy of tissue perfusion will improve Outcome: Progressing   Problem: Education: Goal: Ability to describe self-care measures that may prevent or decrease complications (Diabetes Survival Skills Education) will improve Outcome: Progressing Goal: Individualized Educational Video(s) Outcome: Progressing   Problem: Cardiac: Goal: Ability to maintain an adequate cardiac output will improve Outcome: Progressing   Problem: Health Behavior/Discharge Planning: Goal: Ability to identify and utilize available resources and services will improve Outcome: Progressing Goal: Ability to manage health-related needs will improve Outcome: Progressing   Problem: Fluid Volume: Goal: Ability to achieve a balanced intake and output will improve Outcome: Progressing   Problem: Metabolic: Goal: Ability to maintain appropriate glucose levels will improve Outcome: Progressing    Problem: Nutritional: Goal: Maintenance of adequate nutrition will improve Outcome: Progressing Goal: Maintenance of adequate weight for body size and type will improve Outcome: Progressing   Problem: Respiratory: Goal: Will regain and/or maintain adequate ventilation Outcome: Progressing   Problem: Urinary Elimination: Goal: Ability to achieve and maintain adequate renal perfusion and functioning will improve Outcome: Progressing   Problem: Education: Goal: Knowledge of General Education information will improve Description: Including pain rating scale, medication(s)/side effects and non-pharmacologic comfort measures Outcome: Progressing   Problem: Health Behavior/Discharge Planning: Goal: Ability to manage health-related needs will improve Outcome: Progressing   Problem: Clinical Measurements: Goal: Ability to maintain clinical measurements within normal limits will improve Outcome: Progressing Goal: Will remain free from infection Outcome: Progressing Goal: Diagnostic test results will improve Outcome: Progressing Goal: Respiratory complications will improve Outcome: Progressing Goal: Cardiovascular complication will be avoided Outcome: Progressing   Problem: Nutrition: Goal: Adequate nutrition will be maintained Outcome: Progressing   Problem: Coping: Goal: Level of anxiety will decrease Outcome: Progressing   Problem: Elimination: Goal: Will not experience complications related to bowel motility Outcome: Progressing Goal: Will not experience complications related to urinary retention Outcome: Progressing   Problem: Pain Managment: Goal: General experience of comfort will improve and/or be controlled Outcome: Progressing   Problem: Safety: Goal: Ability to remain free from injury will improve Outcome: Progressing

## 2023-10-08 NOTE — Progress Notes (Signed)
 Occupational Therapy Treatment Patient Details Name: Suzanne Duke MRN: [id number] DOB: Nov 28, 1960 Today's Date: 10/08/2023   History of present illness Patient is a 63 y/o female admitted 09/29/23 from home after spouse found her down on her porch soiled in urine and bugs crawling on her. Found to be dehydrated, confused, in DKA and with hypernatremia.  PMH positive for HNT, DM, GERD, anxiety, depression, s/p bariatric surgery, IDA, HASH, thyroid  goiter.   OT comments  Patient seen for skilled OT session this afternoon. Patient open to all therapy presented and still pleasantly confused and needing cues for safety. Improved supported standing sink side for intervals of BADL's with high falls risk with LE fatigue.  Patient requires continued Acute care hospital level OT services to progress safety and functional performance and allow for discharge. Patient will benefit from continued inpatient follow up therapy, <3 hours/day.         If plan is discharge home, recommend the following:  A little help with walking and/or transfers;A little help with bathing/dressing/bathroom;Assistance with cooking/housework;Direct supervision/assist for medications management;Direct supervision/assist for financial management;Assist for transportation;Supervision due to cognitive status   Equipment Recommendations  Other (comment)       Precautions / Restrictions Precautions Precautions: Fall Recall of Precautions/Restrictions: Impaired Restrictions Weight Bearing Restrictions Per Provider Order: No       Mobility Bed Mobility Overal bed mobility:  (was in recliner and remained)                  Transfers Overall transfer level: Needs assistance Equipment used: Rolling walker (2 wheels) Transfers: Sit to/from Stand Sit to Stand: Contact guard assist, Min assist     Step pivot transfers: Min assist     General transfer comment: Stood sink side for oral care up to 2 min x 3 trials with RW  and recliner in posterior in case of need for abrupt LE's need for rest due to decreased safety awareness     Balance Overall balance assessment: Needs assistance Sitting-balance support: No upper extremity supported, Feet supported Sitting balance-Leahy Scale: Good     Standing balance support: Bilateral upper extremity supported, Reliant on assistive device for balance, During functional activity, Single extremity supported Standing balance-Leahy Scale: Fair Standing balance comment: occasional unilateral use for oral care                           ADL either performed or assessed with clinical judgement   ADL Overall ADL's : Needs assistance/impaired     Grooming: Wash/dry hands;Wash/dry face;Oral care;Contact guard assist;Standing Grooming Details (indicate cue type and reason): sood sink side with min cues for sequencing and RW unilateral support                               General ADL Comments: standign tolerance and balance within supportive frame of RW sink side with cues    Extremity/Trunk Assessment Upper Extremity Assessment Upper Extremity Assessment: Overall WFL for tasks assessed   Lower Extremity Assessment Lower Extremity Assessment: Generalized weakness        Vision   Vision Assessment?: No apparent visual deficits         Communication Communication Communication: No apparent difficulties   Cognition Arousal: Alert Behavior During Therapy: WFL for tasks assessed/performed Cognition: Cognition impaired   Orientation impairments: Situation, Time Awareness: Intellectual awareness impaired, Online awareness impaired Memory impairment (select all impairments): Short-term  memory, Declarative long-term memory Attention impairment (select first level of impairment): Selective attention Executive functioning impairment (select all impairments): Reasoning, Problem solving OT - Cognition Comments: continues to be only Ox1-2,  limitied insight and judgement                 Following commands: Impaired Following commands impaired: Only follows one step commands consistently, Follows multi-step commands with increased time      Cueing   Cueing Techniques: Verbal cues, Gestural cues        General Comments no SOB and VSS    Pertinent Vitals/ Pain       Pain Assessment Pain Assessment: Faces Faces Pain Scale: No hurt   Frequency  Min 2X/week        Progress Toward Goals  OT Goals(current goals can now be found in the care plan section)  Progress towards OT goals: Progressing toward goals  Acute Rehab OT Goals Patient Stated Goal: to get stronger OT Goal Formulation: With patient Time For Goal Achievement: 10/16/23 Potential to Achieve Goals: Fair  Plan      AM-PAC OT 6 Clicks Daily Activity     Outcome Measure   Help from another person eating meals?: A Little Help from another person taking care of personal grooming?: A Little Help from another person toileting, which includes using toliet, bedpan, or urinal?: A Lot Help from another person bathing (including washing, rinsing, drying)?: A Lot Help from another person to put on and taking off regular upper body clothing?: A Little Help from another person to put on and taking off regular lower body clothing?: A Lot 6 Click Score: 15    End of Session Equipment Utilized During Treatment: Rolling walker (2 wheels);Gait belt  OT Visit Diagnosis: Unsteadiness on feet (R26.81);History of falling (Z91.81);Muscle weakness (generalized) (M62.81);Cognitive communication deficit (R41.841) Symptoms and signs involving cognitive functions: Other cerebrovascular disease   Activity Tolerance Patient tolerated treatment well   Patient Left in chair;with call bell/phone within reach;with chair alarm set   Nurse Communication Mobility status        Time: 1700-1715 OT Time Calculation (min): 15 min  Charges: OT General Charges $OT  Visit: 1 Visit OT Treatments $Self Care/Home Management : 8-22 mins  Sadae Arrazola OT/L Acute Rehabilitation Department  [phone]  10/08/2023, 5:26 PM

## 2023-10-08 NOTE — Progress Notes (Addendum)
 PROGRESS NOTE  ALANTRA POPOCA FMW:[id number] DOB: November 05, 1960 DOA: 09/29/2023 PCP: Levora Reyes [name], MD   LOS: 9 days   Brief narrative:  Suzanne Duke is a 63 year old female with past medical history of diabetes mellitus type 2, hypertension, GERD, history of bariatric surgery, IDA, NASH, thyroid  goiter presented to hospital from home after being found on her porch for unknown amount of time.  She was seen by her neighbor and EMS was called in.  In the ED, patient was grossly confused.  It was reported that her husband moved out a few months prior due to change in her behavior and aggression.  Patient was noted to be in DKA and was started on IV fluids and insulin  drip.  She was also noted to have significant hypernatremia in the setting of volume depletion and dehydration.  Patient was then admitted hospital for further evaluation and treatment.    At this time patient's family is unreachable.  Case has been referred to [facility].  TOC on board.  Patient does have baseline disorientation but otherwise stable.  Awaiting for safe disposition.  Medically stable.   Assessment/Plan: Active Problems:   Hypernatremia   Acute metabolic encephalopathy   Normocytic anemia   Unsatisfactory living conditions   Rhabdomyolysis   Essential hypertension   GOITER, NONTOXIC MULTINODULAR   Elevated troponin  DKA, type 2 with mild hyperglycemia Resolved after insulin  drip.  Currently on basal insulin /sliding scale insulin , diabetic diet.  Will continue to monitor closely.  Will add NovoLog  2 units 3 times daily with meals due to postprandial high levels.    Hypernatremia Resolved at this time.  Encourage oral hydration.  Latest sodium of 138   Normocytic anemia Latest hemoglobin at 10.0 initially hemodiluted. Folate low, at 3 and starting on folic acid  supplement. B12 level low/normal 390 continue vitamin B12 supplementation.  No recent CBC.  Acute metabolic encephalopathy with possible underlying dementia. -  Unclear what her baseline is and no family able to provide collateral information.  Unable to reach patient's husband despite her repeated attempts.SABRA  MRI of the brain with parenchymal loss.  Continue thiamine  with low vitamin B1 level.  Ammonia within normal limits.  Likely has underlying dementia.  Patient is oriented to self only.    Vitamin B1 deficiency.  Continue thiamine  orally.   Rhabdomyolysis - Initial CK9 938 on admission.  On IV fluids.  Latest CK level of 250.  Improved.  Encourage oral hydration.  Hypomagnesemia.  Replenished, continue magnesium  oxide.  Check labs in AM.  Unsatisfactory living conditions Continue TOC assistance.   Essential hypertension Not on medications for months now.  Blood pressure however seems to be stable.   Nontoxic multinodular goiter. Prior treatment by endocrinology with stable ultrasound previously.  TSH  suppressed, 0.274.  Free T4 at 1.0.   Elevated troponin Flat troponins.   EKG with sinus rhythm.  No acute issues at this time.  Hyperkalemia followed by mild hypokalemia improved.  Latest potassium of 4.0   DVT prophylaxis: SCDs Start: 09/29/23 1659   Disposition: Pending placement.  Unable to reach the patient's husband despite multiple attempts.  [facility] case as per TOC.  Medically stable for disposition  Status is: Inpatient Remains inpatient appropriate because: Pending  safe disposition    Code Status:     Code Status: Full Code  Family Communication: None available despite attempts  Consultants: None  Procedures: None  Anti-infectives:  None  Anti-infectives (From admission, onward)    None  Subjective: Today, patient was seen and examined at bedside.  Patient without any interval complaints.  No mention of fever chills or rigor.  Disoriented  Objective: Vitals:   10/07/23 2054 10/08/23 0540  BP: (!) 109/58 117/76  Pulse: 75 84  Resp: 16 16  Temp: 97.7 F (36.5 C) 98.5 F (36.9 C)  SpO2: 100% 100%     Intake/Output Summary (Last 24 hours) at 10/08/2023 1025 Last data filed at 10/08/2023 0545 Gross per 24 hour  Intake --  Output 800 ml  Net -800 ml   Filed Weights   09/29/23 1228 09/29/23 1750  Weight: 81.6 kg 79.8 kg   Body mass index is 27.55 kg/m.   Physical Exam:  General:  Average built, not in obvious distress, oriented to self HENT:   No scleral pallor or icterus noted. Oral mucosa is moist.  Chest:  Clear breath sounds.   No crackles or wheezes.  CVS: S1 &S2 heard. No murmur.  Regular rate and rhythm. Abdomen: Soft, nontender, nondistended.  Bowel sounds are heard.   Extremities: No cyanosis, clubbing or edema.  Peripheral pulses are palpable. Psych: Alert, awake and Communicative.  Has underlying cognitive dysfunction with disorientation. CNS:  No cranial nerve deficits.  Power equal in all extremities.   Skin: Warm and dry.  No rashes noted.  Data Review: I have personally reviewed the following laboratory data and studies,  CBC: Recent Labs  Lab 10/02/23 0409 10/03/23 0354 10/04/23 0404 10/05/23 0437  WBC 7.9 7.2 6.7 5.8  NEUTROABS 5.8 4.9 4.6 3.4  HGB 11.6* 11.3* 11.7* 10.0*  HCT 40.2 36.7 39.8 33.9*  MCV 94.1 91.8 92.6 91.9  PLT 105* 110* 115* 136*   Basic Metabolic Panel: Recent Labs  Lab 10/02/23 0409 10/03/23 0354 10/04/23 0404 10/05/23 0437 10/06/23 0347  NA 141 143 137 136 138  K 3.4* 3.5 4.0 3.9 4.0  CL 106 107 102 104 102  CO2 27 29 27 27 26   GLUCOSE 179* 150* 187* 205* 177*  BUN 16 12 16 15 9   CREATININE 0.54 0.53 0.66 0.60 0.48  CALCIUM 7.7* 8.0* 7.9* 7.8* 8.3*  MG 1.8 1.5* 1.6* 1.6*  --    Liver Function Tests: No results for input(s): AST, ALT, ALKPHOS, BILITOT, PROT, ALBUMIN  in the last 168 hours.  No results for input(s): LIPASE, AMYLASE in the last 168 hours. No results for input(s): AMMONIA in the last 168 hours.  Cardiac Enzymes: No results for input(s): CKTOTAL, CKMB, CKMBINDEX, TROPONINI  in the last 168 hours.  BNP (last 3 results) No results for input(s): BNP in the last 8760 hours.  ProBNP (last 3 results) No results for input(s): PROBNP in the last 8760 hours.  CBG: Recent Labs  Lab 10/07/23 0758 10/07/23 1214 10/07/23 1650 10/07/23 2055 10/08/23 0736  GLUCAP 178* 231* 309* 162* 164*   No results found for this or any previous visit (from the past 240 hours).   Studies: No results found.    Robel Wuertz, MD  [facility] 10/08/2023  If 7PM-7AM, please contact night-coverage

## 2023-10-08 NOTE — Plan of Care (Signed)
  Problem: Education: Goal: Ability to describe self-care measures that may prevent or decrease complications (Diabetes Survival Skills Education) will improve Outcome: Progressing   Problem: Coping: Goal: Ability to adjust to condition or change in health will improve Outcome: Progressing   Problem: Clinical Measurements: Goal: Diagnostic test results will improve Outcome: Progressing   Problem: Nutrition: Goal: Adequate nutrition will be maintained Outcome: Progressing   Problem: Coping: Goal: Level of anxiety will decrease Outcome: Progressing   Problem: Safety: Goal: Ability to remain free from injury will improve Outcome: Progressing

## 2023-10-08 NOTE — Plan of Care (Signed)
  Problem: Nutritional: Goal: Maintenance of adequate nutrition will improve Outcome: Progressing   Problem: Clinical Measurements: Goal: Diagnostic test results will improve Outcome: Progressing   Problem: Activity: Goal: Risk for activity intolerance will decrease Outcome: Progressing   Problem: Nutrition: Goal: Adequate nutrition will be maintained Outcome: Progressing   Problem: Safety: Goal: Ability to remain free from injury will improve Outcome: Progressing

## 2023-10-08 NOTE — Progress Notes (Signed)
 Mobility Specialist - Progress Note   10/08/23 1601  Mobility  Activity Ambulated with assistance  Level of Assistance Minimal assist, patient does 75% or more  Assistive Device Front wheel walker  Distance Ambulated (ft) 110 ft  Range of Motion/Exercises Active  Activity Response Tolerated well  Mobility Referral Yes  Mobility visit 1 Mobility  Mobility Specialist Start Time (ACUTE ONLY) 1545  Mobility Specialist Stop Time (ACUTE ONLY) 1600  Mobility Specialist Time Calculation (min) (ACUTE ONLY) 15 min   Pt was found in bed and agreeable to ambulate. No complaints with session. NT followed with recliner chair. At EOS returned to recliner chair with all needs met. NT in room. Chair alarm on.  Erminio Leos,  Mobility Specialist Can be reached via Secure Chat

## 2023-10-09 LAB — GLUCOSE, CAPILLARY
Glucose-Capillary: 156 mg/dL — ABNORMAL HIGH (ref 70–99)
Glucose-Capillary: 285 mg/dL — ABNORMAL HIGH (ref 70–99)
Glucose-Capillary: 149 mg/dL — ABNORMAL HIGH (ref 70–99)
Glucose-Capillary: 194 mg/dL — ABNORMAL HIGH (ref 70–99)

## 2023-10-09 LAB — CBC
HCT: 33.9 % — ABNORMAL LOW (ref 36.0–46.0)
Hemoglobin: 10.4 g/dL — ABNORMAL LOW (ref 12.0–15.0)
MCH: 27.5 pg (ref 26.0–34.0)
MCHC: 30.7 g/dL (ref 30.0–36.0)
MCV: 89.7 fL (ref 80.0–100.0)
Platelets: 346 K/uL (ref 150–400)
RBC: 3.78 MIL/uL — ABNORMAL LOW (ref 3.87–5.11)
RDW: 13.2 % (ref 11.5–15.5)
WBC: 6.8 K/uL (ref 4.0–10.5)
nRBC: 0 % (ref 0.0–0.2)

## 2023-10-09 LAB — BASIC METABOLIC PANEL WITH GFR
Anion gap: 9 (ref 5–15)
BUN: 13 mg/dL (ref 8–23)
CO2: 24 mmol/L (ref 22–32)
Calcium: 8.1 mg/dL — ABNORMAL LOW (ref 8.9–10.3)
Chloride: 100 mmol/L (ref 98–111)
Creatinine, Ser: 0.68 mg/dL (ref 0.44–1.00)
GFR, Estimated: >60 mL/min (ref >60)
Glucose, Bld: 134 mg/dL — ABNORMAL HIGH (ref 70–99)
Potassium: 3.9 mmol/L (ref 3.5–5.1)
Sodium: 133 mmol/L — ABNORMAL LOW (ref 135–145)

## 2023-10-09 LAB — MAGNESIUM: Magnesium: 1.6 mg/dL — ABNORMAL LOW (ref 1.7–2.4)

## 2023-10-09 MED ORDER — MAGNESIUM SULFATE 2 GM/50ML IV SOLN
2.0000 g | Freq: Once | INTRAVENOUS | Status: AC
  Administered 2023-10-09 (×2): 2 g via INTRAVENOUS
  Filled 2023-10-09: qty 50

## 2023-10-09 NOTE — Progress Notes (Signed)
 Physical Therapy Treatment Patient Details Name: Suzanne Duke MRN: [id number] DOB: [birth year] Today's Date: 10/09/2023   History of Present Illness Patient is a 63 y/o female admitted 09/29/23 from home after spouse found her down on her porch soiled in urine and bugs crawling on her. Found to be dehydrated, confused, in DKA and with hypernatremia.  PMH positive for HNT, DM, GERD, anxiety, depression, s/p bariatric surgery, IDA, HASH, thyroid  goiter.    PT Comments  Pt received in bed, agreeable to PT session. Cues given to prevent pt from attempting to exit bed with both side rails up, educated on safety awareness. Able to stand from low bed with CGA after several attempts. Improved transfer ability from [facility] with push off from arm rests. Pt completed gait training with RW in hall. Cues given to promote upright posture and reduce fall risk. Pt appears to be improving with functional mobility, however continues to demonstrate poor insight and safety awareness placing her at a high fall risk with concerns for ability to care for self. Confusion and Hx of cognitive impairments still apparent. Will continue to progress towards acute PT goals per POC.    If plan is discharge home, recommend the following: A lot of help with bathing/dressing/bathroom;A lot of help with walking and/or transfers;Assistance with cooking/housework;Assist for transportation;Help with stairs or ramp for entrance   Can travel by private vehicle     No  Equipment Recommendations  Rolling walker (2 wheels)    Recommendations for Other Services       Precautions / Restrictions Precautions Precautions: Fall Recall of Precautions/Restrictions: Impaired Restrictions Weight Bearing Restrictions Per Provider Order: No     Mobility  Bed Mobility Overal bed mobility: Needs Assistance Bed Mobility: Supine to Sit     Supine to sit: Supervision, HOB elevated, Used rails     General bed mobility comments:  (Cues for  safety, pt attempting to get OOB with both side rails up)    Transfers Overall transfer level: Needs assistance Equipment used: Rolling walker (2 wheels) Transfers: Sit to/from Stand Sit to Stand: Supervision, Contact guard assist           General transfer comment: Pt able to stand from low bed with CGA after 2 attempts, able to rise from [facility] with use of B UE's on first attempt    Ambulation/Gait Ambulation/Gait assistance: Supervision, Contact guard assist Gait Distance (Feet): 125 Feet Assistive device: Rolling walker (2 wheels) Gait Pattern/deviations: Step-through pattern, Step-to pattern, Wide base of support, Decreased stride length, Trunk flexed       General Gait Details: No LOB with use of RW. Able to ambulate around objects with good safety awareness   Stairs             Wheelchair Mobility     Tilt Bed    Modified Rankin (Stroke Patients Only)       Balance Overall balance assessment: Needs assistance Sitting-balance support: No upper extremity supported, Feet supported Sitting balance-Leahy Scale: Good     Standing balance support: Bilateral upper extremity supported, During functional activity, Reliant on assistive device for balance Standing balance-Leahy Scale: Fair Standing balance comment: Pt requires reminders to use RW for support, high fall risk without use of Assistive Device                            Communication Communication Communication: No apparent difficulties  Cognition Arousal: Alert     PT -  Cognitive impairments: History of cognitive impairments   Orientation impairments: Place, Time, Situation                   PT - Cognition Comments: Pt remains confused, thinks her 46 year old son is still alive who passed 10+ years ago Following commands: Impaired Following commands impaired: Follows one step commands inconsistently    Cueing Cueing Techniques: Verbal cues, Gestural cues  Exercises  General Exercises - Lower Extremity Ankle Circles/Pumps: AROM, Both, 10 reps, Seated Long Arc Quad: AROM, Both, 10 reps, Seated Hip Flexion/Marching: AROM, Both, 5 reps, Seated    General Comments General comments (skin integrity, edema, etc.): Small skin opening inside of Left buttocks, nursing notified.      Pertinent Vitals/Pain Pain Assessment Pain Assessment: No/denies pain    Home Living                          Prior Function            PT Goals (current goals can now be found in the care plan section) Acute Rehab PT Goals Patient Stated Goal: return home Progress towards PT goals: Progressing toward goals    Frequency    Min 2X/week      PT Plan      Co-evaluation              AM-PAC PT 6 Clicks Mobility   Outcome Measure  Help needed turning from your back to your side while in a flat bed without using bedrails?: A Lot Help needed moving from lying on your back to sitting on the side of a flat bed without using bedrails?: A Lot Help needed moving to and from a bed to a chair (including a wheelchair)?: A Little Help needed standing up from a chair using your arms (e.g., wheelchair or bedside chair)?: A Little Help needed to walk in hospital room?: A Little Help needed climbing 3-5 steps with a railing? : A Lot 6 Click Score: 15    End of Session Equipment Utilized During Treatment: Gait belt Activity Tolerance: Patient tolerated treatment well Patient left: in chair;with call bell/phone within reach;with chair alarm set Nurse Communication: Mobility status;Other (comment) (Skin opening on inside of Left buttocks) PT Visit Diagnosis: Other abnormalities of gait and mobility (R26.89);Muscle weakness (generalized) (M62.81);History of falling (Z91.81);Other symptoms and signs involving the nervous system (R29.898)     Time: 8798-8774 PT Time Calculation (min) (ACUTE ONLY): 24 min  Charges:    $Gait Training: 8-22 mins $Therapeutic  Exercise: 8-22 mins PT General Charges $$ ACUTE PT VISIT: 1 Visit                    Darice Bohr, PTA  Darice JAYSON Bohr 10/09/2023, 12:36 PM

## 2023-10-09 NOTE — Progress Notes (Signed)
 PROGRESS NOTE  [name] FMW:[id number] DOB: 01-27-1961 DOA: 09/29/2023 PCP: Levora Reyes [name], MD   LOS: 10 days   Brief narrative: [name] is a 63 year old female with past medical history of diabetes mellitus type 2, hypertension, GERD, history of bariatric surgery, IDA, NASH, thyroid  goiter presented to hospital from home after being found on her porch for unknown amount of time.  She was seen by her neighbor and EMS was called in.  In the ED, patient was grossly confused.  It was reported that her husband moved out a few months prior due to change in her behavior and aggression.  Patient was noted to be in DKA and was started on IV fluids and insulin  drip.  She was also noted to have significant hypernatremia in the setting of volume depletion and dehydration.  Patient was then admitted hospital for further evaluation and treatment. Case has been referred to [facility]PS.  TOC on board.  Patient does have baseline disorientation but otherwise stable.  Awaiting for safe disposition.  Medically stable.   Assessment/Plan: Active Problems:   Hypernatremia   Acute metabolic encephalopathy   Normocytic anemia   Unsatisfactory living conditions   Rhabdomyolysis   Essential hypertension   GOITER, NONTOXIC MULTINODULAR   Elevated troponin   DKA, type 2 with mild hyperglycemia Resolved after insulin  drip Currently on basal insulin /sliding scale insulin , diabetic diet   Normocytic anemia Hemoglobin stable Folate low, at 3 and starting on folic acid  supplement. B12 level low/normal 390 continue vitamin B12 supplementation  Acute metabolic encephalopathy with possible underlying dementia Unclear what her baseline is and no family able to provide collateral information.  Unable to reach patient's husband despite her repeated attempts. MRI of the brain with parenchymal loss.  Continue thiamine  with low vitamin B1 level.  Ammonia within normal limits.  Likely has underlying dementia.  Patient is  oriented to self only.    Vitamin B1 deficiency Continue thiamine  orally.   Rhabdomyolysis Resolved  Initial CK9 938--> 250.  Improved.  Encourage oral hydration  Hypomagnesemia Replace as needed  Unsatisfactory living conditions Continue TOC assistance   Nontoxic multinodular goiter Prior treatment by endocrinology with stable ultrasound previously.  TSH  suppressed, 0.274.  Free T4 at 1.0.   Elevated troponin Flat troponins.   EKG with sinus rhythm.  No acute issues at this time.     DVT prophylaxis: SCDs Start: 09/29/23 1659   Disposition: Pending placement.  Unable to reach the patient's husband despite multiple attempts.  [facility] case as per TOC.  Medically stable for disposition  Status is: Inpatient Remains inpatient appropriate because: Pending  safe disposition    Code Status:     Code Status: Full Code  Family Communication: None available despite attempts  Consultants: None  Procedures: None  Anti-infectives:  None  Anti-infectives (From admission, onward)    None      Subjective: Denies any new complaints.  Intermittently disoriented.  Oriented to self, date of birth  Objective: Vitals:   10/09/23 0605 10/09/23 1217  BP: 123/72 131/65  Pulse: 83 (!) 106  Resp: 16 18  Temp: 98.4 F (36.9 C) 99.4 F (37.4 C)  SpO2: 100% 98%    Intake/Output Summary (Last 24 hours) at 10/09/2023 2005 Last data filed at 10/09/2023 1329 Gross per 24 hour  Intake 320 ml  Output 850 ml  Net -530 ml   Filed Weights   09/29/23 1228 09/29/23 1750  Weight: 81.6 kg 79.8 kg   Body mass index  is 27.55 kg/m.   Physical Exam: General: NAD  Cardiovascular: S1, S2 present Respiratory: CTAB Abdomen: Soft, nontender, nondistended, bowel sounds present Musculoskeletal: No bilateral pedal edema noted Skin: Normal Psychiatry: Normal mood   Data Review: I have personally reviewed the following laboratory data and studies,  CBC: Recent Labs  Lab  10-16-2023 0354 10/04/23 0404 10/05/23 0437 10/09/23 0406  WBC 7.2 6.7 5.8 6.8  NEUTROABS 4.9 4.6 3.4  --   HGB 11.3* 11.7* 10.0* 10.4*  HCT 36.7 39.8 33.9* 33.9*  MCV 91.8 92.6 91.9 89.7  PLT 110* 115* 136* 346   Basic Metabolic Panel: Recent Labs  Lab 10/16/2023 0354 10/04/23 0404 10/05/23 0437 10/06/23 0347 10/09/23 0406  NA 143 137 136 138 133*  K 3.5 4.0 3.9 4.0 3.9  CL 107 102 104 102 100  CO2 29 27 27 26 24   GLUCOSE 150* 187* 205* 177* 134*  BUN 12 16 15 9 13   CREATININE 0.53 0.66 0.60 0.48 0.68  CALCIUM 8.0* 7.9* 7.8* 8.3* 8.1*  MG 1.5* 1.6* 1.6*  --  1.6*   Liver Function Tests: No results for input(s): AST, ALT, ALKPHOS, BILITOT, PROT, ALBUMIN  in the last 168 hours.  No results for input(s): LIPASE, AMYLASE in the last 168 hours. No results for input(s): AMMONIA in the last 168 hours.  Cardiac Enzymes: No results for input(s): CKTOTAL, CKMB, CKMBINDEX, TROPONINI in the last 168 hours.  BNP (last 3 results) No results for input(s): BNP in the last 8760 hours.  ProBNP (last 3 results) No results for input(s): PROBNP in the last 8760 hours.  CBG: Recent Labs  Lab 10/08/23 1618 10/08/23 2047 10/09/23 0728 10/09/23 1214 10/09/23 1656  GLUCAP 226* 271* 156* 285* 149*   No results found for this or any previous visit (from the past 240 hours).   Studies: No results found.    Lebron JINNY Cage, MD  [facility] 10/09/2023  If 7PM-7AM, please contact night-coverage

## 2023-10-10 LAB — GLUCOSE, CAPILLARY
Glucose-Capillary: 149 mg/dL — ABNORMAL HIGH (ref 70–99)
Glucose-Capillary: 332 mg/dL — ABNORMAL HIGH (ref 70–99)
Glucose-Capillary: 126 mg/dL — ABNORMAL HIGH (ref 70–99)
Glucose-Capillary: 175 mg/dL — ABNORMAL HIGH (ref 70–99)

## 2023-10-10 LAB — BASIC METABOLIC PANEL WITH GFR
Anion gap: 7 (ref 5–15)
BUN: 14 mg/dL (ref 8–23)
CO2: 26 mmol/L (ref 22–32)
Calcium: 8.1 mg/dL — ABNORMAL LOW (ref 8.9–10.3)
Chloride: 103 mmol/L (ref 98–111)
Creatinine, Ser: 0.63 mg/dL (ref 0.44–1.00)
GFR, Estimated: >60 mL/min (ref >60)
Glucose, Bld: 148 mg/dL — ABNORMAL HIGH (ref 70–99)
Potassium: 3.7 mmol/L (ref 3.5–5.1)
Sodium: 136 mmol/L (ref 135–145)

## 2023-10-10 LAB — MAGNESIUM: Magnesium: 1.9 mg/dL (ref 1.7–2.4)

## 2023-10-10 NOTE — Progress Notes (Signed)
 Mobility Specialist - Progress Note   10/10/23 1525  Mobility  Activity Ambulated with assistance  Level of Assistance Contact guard assist, steadying assist  Assistive Device Front wheel walker  Distance Ambulated (ft) 130 ft  Range of Motion/Exercises Active  Activity Response Tolerated well  Mobility Referral Yes  Mobility visit 1 Mobility  Mobility Specialist Start Time (ACUTE ONLY) 1510  Mobility Specialist Stop Time (ACUTE ONLY) 1525  Mobility Specialist Time Calculation (min) (ACUTE ONLY) 15 min   Pt was found on recliner chair and agreeable to ambulate. Requires safety cues. At EOS returned to recliner chair with all needs met. Call bell in reach.  Suzanne Duke,  Mobility Specialist Can be reached via Secure Chat

## 2023-10-10 NOTE — Progress Notes (Signed)
 Occupational Therapy Treatment Patient Details Name: Suzanne Duke MRN: [id number] DOB: 06-20-1960 Today's Date: 10/10/2023   History of present illness Patient is a 63 y/o female admitted 09/29/23 from home after spouse found her down on her porch soiled in urine and bugs crawling on her. Found to be dehydrated, confused, in DKA and with hypernatremia.  PMH positive for HNT, DM, GERD, anxiety, depression, s/p bariatric surgery, IDA, HASH, thyroid  goiter.   OT comments  Patient seen for skilled OT session his am. Progressed to short distance amb with cues and assist with RW to and from bed to recliner to toilet and sinkside both seated and standing level activity. Continues to present with cognitive/safety deficits thus discharge plan remains appropriate for continued inpatient follow up therapy, <3 hours/day. OT will continue to follow while in acute hospital setting.        If plan is discharge home, recommend the following:  A little help with walking and/or transfers;A little help with bathing/dressing/bathroom;Assistance with cooking/housework;Direct supervision/assist for medications management;Direct supervision/assist for financial management;Assist for transportation;Supervision due to cognitive status   Equipment Recommendations  Other (comment) (TBD post rehab)       Precautions / Restrictions Precautions Precautions: Fall Recall of Precautions/Restrictions: Impaired Restrictions Weight Bearing Restrictions Per Provider Order: No       Mobility Bed Mobility Overal bed mobility: Needs Assistance Bed Mobility: Supine to Sit     Supine to sit: Supervision, HOB elevated, Used rails          Transfers Overall transfer level: Needs assistance Equipment used: Rolling walker (2 wheels) Transfers: Sit to/from Stand, Bed to chair/wheelchair/BSC Sit to Stand: Supervision, Contact guard assist     Step pivot transfers: Min assist     General transfer comment: cues for  safety and hand placement     Balance Overall balance assessment: Needs assistance Sitting-balance support: No upper extremity supported, Feet supported Sitting balance-Leahy Scale: Good Sitting balance - Comments: able to scoot up in bed in long sitting Postural control: Posterior lean Standing balance support: Bilateral upper extremity supported, During functional activity, Reliant on assistive device for balance Standing balance-Leahy Scale: Fair Standing balance comment: Pt requires reminders to use RW for support, high fall risk without use of Assistive Device                           ADL either performed or assessed with clinical judgement   ADL Overall ADL's : Needs assistance/impaired     Grooming: Wash/dry hands;Wash/dry face;Oral care;Applying deodorant;Contact guard assist;Standing;Cueing for sequencing;Cueing for safety Grooming Details (indicate cue type and reason): seated and standing level grooming and bathiing on commode sinkside with tolerace up until 2 minute intervals Upper Body Bathing: Set up;Standing;Cueing for sequencing   Lower Body Bathing: Minimal assistance;Sit to/from stand;Cueing for sequencing;Cueing for safety   Upper Body Dressing : Contact guard assist;Sitting;Cueing for sequencing   Lower Body Dressing: Moderate assistance;Sit to/from stand;Cueing for safety;Cueing for sequencing   Toilet Transfer: Minimal assistance;Rolling walker (2 wheels);Regular Toilet;Ambulation;Grab bars;Cueing for safety;Cueing for sequencing Toilet Transfer Details (indicate cue type and reason): amb to and from toilet with cues for safety Toileting- Clothing Manipulation and Hygiene: Contact guard assist;Sit to/from stand       Functional mobility during ADLs: Minimal assistance;Rolling walker (2 wheels) General ADL Comments: progressing with standing and seated level amb    Extremity/Trunk Assessment Upper Extremity Assessment Upper Extremity Assessment:  Overall WFL for tasks assessed;Right hand dominant  Lower Extremity Assessment Lower Extremity Assessment: Defer to PT evaluation        Vision   Vision Assessment?: No apparent visual deficits         Communication Communication Communication: No apparent difficulties   Cognition Arousal: Alert Behavior During Therapy: WFL for tasks assessed/performed Cognition: Cognition impaired   Orientation impairments: Situation, Time Awareness: Intellectual awareness impaired, Online awareness impaired Memory impairment (select all impairments): Short-term memory, Declarative long-term memory Attention impairment (select first level of impairment): Selective attention Executive functioning impairment (select all impairments): Reasoning, Problem solving OT - Cognition Comments: decreased insight into deficits but redirectable and cooperative and open to all therapy presented                 Following commands: Impaired Following commands impaired: Follows one step commands inconsistently      Cueing   Cueing Techniques: Verbal cues, Gestural cues        General Comments wound on buttocks with dresisng intact    Pertinent Vitals/ Pain       Pain Assessment Pain Assessment: No/denies pain   Frequency  Min 2X/week        Progress Toward Goals  OT Goals(current goals can now be found in the care plan section)  Progress towards OT goals: Progressing toward goals  Acute Rehab OT Goals Patient Stated Goal: to get home soon OT Goal Formulation: With patient Time For Goal Achievement: 10/16/23 Potential to Achieve Goals: Fair  Plan         AM-PAC OT 6 Clicks Daily Activity     Outcome Measure   Help from another person eating meals?: A Little Help from another person taking care of personal grooming?: A Little Help from another person toileting, which includes using toliet, bedpan, or urinal?: A Lot Help from another person bathing (including washing,  rinsing, drying)?: A Lot Help from another person to put on and taking off regular upper body clothing?: A Little Help from another person to put on and taking off regular lower body clothing?: A Lot 6 Click Score: 15    End of Session Equipment Utilized During Treatment: Rolling walker (2 wheels);Gait belt  OT Visit Diagnosis: Unsteadiness on feet (R26.81);History of falling (Z91.81);Muscle weakness (generalized) (M62.81);Cognitive communication deficit (R41.841) Symptoms and signs involving cognitive functions: Other cerebrovascular disease   Activity Tolerance Patient tolerated treatment well   Patient Left in chair;with call bell/phone within reach;with chair alarm set   Nurse Communication Mobility status        Time: [phone] OT Time Calculation (min): 26 min  Charges: OT General Charges $OT Visit: 1 Visit OT Treatments $Self Care/Home Management : 23-37 mins  Breyon Sigg OT/L Acute Rehabilitation Department  [phone]  10/10/2023, 12:16 PM

## 2023-10-10 NOTE — Progress Notes (Signed)
 Mobility Specialist - Progress Note   10/10/23 1115  Mobility  Activity Ambulated with assistance  Level of Assistance Contact guard assist, steadying assist  Assistive Device Front wheel walker  Distance Ambulated (ft) 150 ft  Range of Motion/Exercises Active  Activity Response Tolerated well  Mobility Referral Yes  Mobility visit 1 Mobility  Mobility Specialist Start Time (ACUTE ONLY) 1100  Mobility Specialist Stop Time (ACUTE ONLY) 1115  Mobility Specialist Time Calculation (min) (ACUTE ONLY) 15 min   Pt was found on recliner chair and agreeable to ambulate. Requires safety cues during ambulation. At EOS returned to recliner chair with all needs met. Call bell in reach and chair alarm on.   Erminio Leos,  Mobility Specialist Can be reached via Secure Chat

## 2023-10-10 NOTE — [place] FL2 (Signed)
 [place]  MEDICAID FL2 LEVEL OF CARE FORM     IDENTIFICATION  Patient Name: Suzanne Duke Birthdate: 02-Jan-1961 Sex: female Admission Date (Current Location): 09/29/2023  [facility] and [place] Number:  Lobbyist and Address:  [facility],  [address], [place] [address]      Provider Number: [id number]  Attending Physician Name and Address:  Donnamarie Lebron PARAS, MD  Relative Name and Phone Number:  [name] (spouse) Ph: [phone]    Current Level of Care: Hospital Recommended Level of Care: Memory Care Prior Approval Number:    Date Approved/Denied:   PASRR Number:    Discharge Plan: Other (Comment) (Memory care)    Current Diagnoses: Patient Active Problem List   Diagnosis Date Noted   Normocytic anemia 10/01/2023   Rhabdomyolysis 10/01/2023   Elevated troponin 09/30/2023   Anxiety and depression 09/29/2023   Hypernatremia 09/29/2023   Acute metabolic encephalopathy 09/29/2023   Unsatisfactory living conditions 09/29/2023   Reactive airway disease 09/18/2018   Liver disorder 02/02/2014   NASH (nonalcoholic steatohepatitis) 11/28/2013   Arthralgia 08/27/2011   Left hand pain 08/27/2011   Encounter for long-term (current) use of other medications 08/27/2011   GERD 05/11/2010   BARIATRIC SURGERY STATUS 06/01/2009   Diabetes (HCC) 07/17/2007   ANEMIA-IRON DEFICIENCY 02/17/2007   ANXIETY 02/17/2007   DEPRESSION 02/17/2007   HICCUPS 02/17/2007   LUMBAR RADICULOPATHY 01/13/2007   GOITER, NONTOXIC MULTINODULAR 01/07/2007   UNSPECIFIED NEURALGIA NEURITIS AND RADICULITIS 01/07/2007   Essential hypertension 09/21/2006    Orientation RESPIRATION BLADDER Height & Weight     Self  Normal Incontinent Weight: 175 lb 14.8 oz (79.8 kg) Height:  5' 7 (170.2 cm)  BEHAVIORAL SYMPTOMS/MOOD NEUROLOGICAL BOWEL NUTRITION STATUS      Continent Diet (Card modified)  AMBULATORY STATUS COMMUNICATION OF NEEDS Skin   Limited Assist Verbally  Other (Comment) (Ecchymosis: right thigh; Erythema: buttocks)                       Personal Care Assistance Level of Assistance  Bathing, Feeding, Dressing Bathing Assistance: Limited assistance Feeding assistance: Limited assistance Dressing Assistance: Limited assistance     Functional Limitations Info  Sight, Hearing, Speech Sight Info: Impaired Hearing Info: Impaired Speech Info: Adequate    SPECIAL CARE FACTORS FREQUENCY                       Contractures Contractures Info: Not present    Additional Factors Info  Code Status, Allergies Code Status Info: Full Allergies Info: Sulfonamide Derivatives           Current Medications (10/10/2023):  This is the current hospital active medication list Current Facility-Administered Medications  Medication Dose Route Frequency Provider Last Rate Last Admin   acetaminophen  (TYLENOL ) tablet 650 mg  650 mg Oral Q6H PRN Patsy Lenis, MD   650 mg at 10/09/23 2133   Or   acetaminophen  (TYLENOL ) suppository 650 mg  650 mg Rectal Q6H PRN Patsy Lenis, MD       Chlorhexidine  Gluconate Cloth 2 % PADS 6 each  6 each Topical Daily Patsy Lenis, MD   6 each at 10/08/23 1103   cyanocobalamin  (VITAMIN B12) tablet 1,000 mcg  1,000 mcg Oral Daily Patsy Lenis, MD   1,000 mcg at 10/10/23 0955   dextrose  50 % solution 0-50 mL  0-50 mL Intravenous PRN Patsy Lenis, MD       docusate sodium  (COLACE) capsule 100 mg  100 mg Oral BID Pokhrel, Laxman, MD   100 mg at 10/10/23 0955   feeding supplement (ENSURE PLUS HIGH PROTEIN) liquid 237 mL  237 mL Oral BID BM Patsy Lenis, MD   237 mL at 10/10/23 9043   folic acid  (FOLVITE ) tablet 1 mg  1 mg Oral Daily Patsy Lenis, MD   1 mg at 10/10/23 9044   insulin  aspart (novoLOG ) injection 0-15 Units  0-15 Units Subcutaneous TID WC Patsy Lenis, MD   11 Units at 10/10/23 1333   insulin  aspart (novoLOG ) injection 0-5 Units  0-5 Units Subcutaneous QHS Patsy Lenis, MD   3 Units at  10/08/23 2210   insulin  aspart (novoLOG ) injection 2 Units  2 Units Subcutaneous TID WC Pokhrel, Laxman, MD   2 Units at 10/10/23 1333   insulin  glargine-yfgn (SEMGLEE ) injection 10 Units  10 Units Subcutaneous Daily Patsy Lenis, MD   10 Units at 10/10/23 1000   leptospermum manuka honey (MEDIHONEY) paste 1 Application  1 Application Topical Daily Patsy Lenis, MD   1 Application at 10/08/23 0858   lip balm (CARMEX) ointment   Topical PRN Patsy Lenis, MD       magnesium  oxide (MAG-OX) tablet 400 mg  400 mg Oral BID Pokhrel, Laxman, MD   400 mg at 10/10/23 9044   multivitamin with minerals tablet 1 tablet  1 tablet Oral Daily Patsy Lenis, MD   1 tablet at 10/10/23 9043   ondansetron  (ZOFRAN ) tablet 4 mg  4 mg Oral Q6H PRN Patsy Lenis, MD       Or   ondansetron  (ZOFRAN ) injection 4 mg  4 mg Intravenous Q6H PRN Patsy Lenis, MD       Oral care mouth rinse  15 mL Mouth Rinse PRN Patsy Lenis, MD       polyethylene glycol (MIRALAX  / GLYCOLAX ) packet 17 g  17 g Oral Daily PRN Pokhrel, Laxman, MD   17 g at 10/07/23 1312   thiamine  (VITAMIN B1) tablet 100 mg  100 mg Oral Daily Patsy Lenis, MD   100 mg at 10/10/23 9044     Discharge Medications: Please see discharge summary for a list of discharge medications.  Relevant Imaging Results:  Relevant Lab Results:   Additional Information SSN: [id number]  [name], LCSW

## 2023-10-10 NOTE — Inpatient Diabetes Management (Signed)
 Inpatient Diabetes Program Recommendations  AACE/ADA: New Consensus Statement on Inpatient Glycemic Control (2015)  Target Ranges:  Prepandial:   less than 140 mg/dL      Peak postprandial:   less than 180 mg/dL (1-2 hours)      Critically ill patients:  140 - 180 mg/dL   Lab Results  Component Value Date   GLUCAP 332 (H) 10/10/2023   HGBA1C 7.4 (H) 04/12/2021    Review of Glycemic Control  Diabetes history: DM2 Outpatient Diabetes medications: None, previously on metformin  500 BID Current orders for Inpatient glycemic control: Semglee  10 daily, Novolog  0-15 TID with meals and 0-5 HS + 2 units TID  HgbA1C - pending Post-prandials elevated  Pt was on Lantus  10 units daily 2 years ago  Inpatient Diabetes Program Recommendations:    Need updated HgbA1C  Consider increasing Novolog  to 4 units TID with meals if eating > 50%  Awaiting placement.  Thank you. Shona Brandy, RD, LDN, CDCES Inpatient Diabetes Coordinator [phone]

## 2023-10-10 NOTE — Progress Notes (Signed)
 PROGRESS NOTE  DALLYS NOWAKOWSKI FMW:[id number] DOB: 1961/01/02 DOA: 09/29/2023 PCP: Levora Reyes [name], MD   LOS: 11 days   Brief narrative: Suzanne Duke is a 63 year old female with past medical history of diabetes mellitus type 2, hypertension, GERD, history of bariatric surgery, IDA, NASH, thyroid  goiter presented to hospital from home after being found on her porch for unknown amount of time.  She was seen by her neighbor and EMS was called in.  In the ED, patient was grossly confused.  It was reported that her husband moved out a few months prior due to change in her behavior and aggression.  Patient was noted to be in DKA and was started on IV fluids and insulin  drip.  She was also noted to have significant hypernatremia in the setting of volume depletion and dehydration.  Patient was then admitted hospital for further evaluation and treatment. Case has been referred to APS.  TOC on board.  Patient does have baseline disorientation but otherwise stable.  Awaiting for safe disposition.  Medically stable.   Assessment/Plan: Active Problems:   Hypernatremia   Acute metabolic encephalopathy   Normocytic anemia   Unsatisfactory living conditions   Rhabdomyolysis   Essential hypertension   GOITER, NONTOXIC MULTINODULAR   Elevated troponin   DKA, type 2 with mild hyperglycemia Resolved after insulin  drip Currently on basal insulin /sliding scale insulin , diabetic diet   Normocytic anemia Hemoglobin stable Folate low, at 3 and starting on folic acid  supplement. B12 level low/normal 390 continue vitamin B12 supplementation  Acute metabolic encephalopathy with possible underlying dementia Unclear what her baseline is and no family able to provide collateral information.  Unable to reach patient's husband despite her repeated attempts. MRI of the brain with parenchymal loss.  Continue thiamine  with low vitamin B1 level.  Ammonia within normal limits.  Likely has underlying dementia.  Patient is  oriented to self only.    Vitamin B1 deficiency Continue thiamine  orally.   Rhabdomyolysis Resolved  Initial CK9 938--> 250.  Improved.  Encourage oral hydration  Hypomagnesemia Replace as needed  Unsatisfactory living conditions Continue TOC assistance   Nontoxic multinodular goiter Prior treatment by endocrinology with stable ultrasound previously.  TSH  suppressed, 0.274.  Free T4 at 1.0.   Elevated troponin Flat troponins.   EKG with sinus rhythm.  No acute issues at this time.     DVT prophylaxis: SCDs Start: 09/29/23 1659   Disposition: Pending placement.  Unable to reach the patient's husband despite multiple attempts.  APS case as per TOC.  Medically stable for disposition  Status is: Inpatient Remains inpatient appropriate because: Pending  safe disposition    Code Status:     Code Status: Full Code  Family Communication: None available despite attempts  Consultants: None  Procedures: None  Anti-infectives:  None  Anti-infectives (From admission, onward)    None      Subjective: Denies any new complaints, appears comfortable.  Objective: Vitals:   10/10/23 0436 10/10/23 1345  BP: (!) 108/58 108/63  Pulse: 64 72  Resp: 17 18  Temp: 98 F (36.7 C) 99 F (37.2 C)  SpO2: 100% 100%    Intake/Output Summary (Last 24 hours) at 10/10/2023 1940 Last data filed at 10/10/2023 1819 Gross per 24 hour  Intake 840 ml  Output 1100 ml  Net -260 ml   Filed Weights   09/29/23 1228 09/29/23 1750  Weight: 81.6 kg 79.8 kg   Body mass index is 27.55 kg/m.   Physical Exam: General:  NAD  Cardiovascular: S1, S2 present Respiratory: CTAB Abdomen: Soft, nontender, nondistended, bowel sounds present Musculoskeletal: No bilateral pedal edema noted Skin: Normal Psychiatry: Normal mood   Data Review: I have personally reviewed the following laboratory data and studies,  CBC: Recent Labs  Lab 10/04/23 0404 10/05/23 0437 10/09/23 0406  WBC 6.7  5.8 6.8  NEUTROABS 4.6 3.4  --   HGB 11.7* 10.0* 10.4*  HCT 39.8 33.9* 33.9*  MCV 92.6 91.9 89.7  PLT 115* 136* 346   Basic Metabolic Panel: Recent Labs  Lab 10/04/23 0404 10/05/23 0437 10/06/23 0347 10/09/23 0406 10/10/23 0355  NA 137 136 138 133* 136  K 4.0 3.9 4.0 3.9 3.7  CL 102 104 102 100 103  CO2 27 27 26 24 26   GLUCOSE 187* 205* 177* 134* 148*  BUN 16 15 9 13 14   CREATININE 0.66 0.60 0.48 0.68 0.63  CALCIUM 7.9* 7.8* 8.3* 8.1* 8.1*  MG 1.6* 1.6*  --  1.6* 1.9   Liver Function Tests: No results for input(s): AST, ALT, ALKPHOS, BILITOT, PROT, ALBUMIN  in the last 168 hours.  No results for input(s): LIPASE, AMYLASE in the last 168 hours. No results for input(s): AMMONIA in the last 168 hours.  Cardiac Enzymes: No results for input(s): CKTOTAL, CKMB, CKMBINDEX, TROPONINI in the last 168 hours.  BNP (last 3 results) No results for input(s): BNP in the last 8760 hours.  ProBNP (last 3 results) No results for input(s): PROBNP in the last 8760 hours.  CBG: Recent Labs  Lab 10/09/23 1656 10/09/23 2137 10/10/23 0730 10/10/23 1106 10/10/23 1601  GLUCAP 149* 194* 149* 332* 126*   No results found for this or any previous visit (from the past 240 hours).   Studies: No results found.    Lebron JINNY Cage, MD  [facility] 10/10/2023  If 7PM-7AM, please contact night-coverage

## 2023-10-10 NOTE — TOC Progression Note (Addendum)
 Transition of Care [facility]) - Progression Note   Patient Details  Name: Suzanne Duke MRN: [id number] Date of Birth: [birth year]  Transition of Care [facility]) CM/SW Contact  [name], LCSW Phone Number: 10/10/2023, 2:22 PM  Clinical Narrative: CSW spoke with patient's husband and he provided patient's insurance information [employer], policy #[id number]). Husband reported the patient cannot return to the house due to her memory issues stating, she still thinks her son is with her. Husband reported he will not be involved in her care after discharge and continually stated patient cannot return to the home.  CSW gave policy information to registration, but patient's [employer] does not show as active.  CSW called Jon Keel, patient's APS caseworker, and provided her with husband's contact information. CSW discussed patient's progress with PT and patient likely no longer needing rehab. Ms. Keel requested [facility] for memory care for the patient to be provided to an APS placement social worker. Ms. Keel aware patient does not have a formal dementia diagnosis at this time.  FL2 completed and emailed to Ms. Roberson ([email or web address]).  Addendum: CSW staffed case with Care Management supervisor, Zack. Case to be reviewed by [facility] for possible LTC placement.  Expected Discharge Plan: Skilled Nursing Facility Barriers to Discharge: SNF Pending bed offer, Inadequate or no insurance, Family Issues  Expected Discharge Plan and Services In-house Referral: Clinical Social Work Discharge Planning Services: CM Consult Post Acute Care Choice: Skilled Nursing Facility Living arrangements for the past 2 months: Single Family Home              DME Arranged: N/A DME Agency: NA HH Arranged: NA HH Agency: NA  Social Drivers of Health (SDOH) Interventions SDOH Screenings   Food Insecurity: Patient Unable To Answer (09/30/2023)  Transportation Needs: Patient Unable To Answer  (09/30/2023)  Depression (PHQ2-9): Low Risk  (04/12/2021)  Tobacco Use: High Risk (10/05/2023)   Readmission Risk Interventions    10/01/2023   10:07 AM  Readmission Risk Prevention Plan  Post Dischage Appt Complete  Medication Screening Complete  Transportation Screening Complete

## 2023-10-11 LAB — GLUCOSE, CAPILLARY
Glucose-Capillary: 157 mg/dL — ABNORMAL HIGH (ref 70–99)
Glucose-Capillary: 233 mg/dL — ABNORMAL HIGH (ref 70–99)
Glucose-Capillary: 161 mg/dL — ABNORMAL HIGH (ref 70–99)
Glucose-Capillary: 135 mg/dL — ABNORMAL HIGH (ref 70–99)

## 2023-10-11 NOTE — Progress Notes (Signed)
 Mobility Specialist - Progress Note   10/11/23 1036  Mobility  Activity Ambulated with assistance  Level of Assistance Contact guard assist, steadying assist  Assistive Device Front wheel walker  Distance Ambulated (ft) 150 ft  Range of Motion/Exercises Active  Activity Response Tolerated well  Mobility Referral Yes  Mobility visit 1 Mobility  Mobility Specialist Start Time (ACUTE ONLY) 1020  Mobility Specialist Stop Time (ACUTE ONLY) 1036  Mobility Specialist Time Calculation (min) (ACUTE ONLY) 16 min   Pt was found in bed and agreeable to ambulate. Requires safety cues during session. Returned to recliner chair with all needs met. Call bell in reach and chair alarm on.  Erminio Leos,  Mobility Specialist Can be reached via Secure Chat

## 2023-10-11 NOTE — TOC Progression Note (Addendum)
 Transition of Care [facility]) - Progression Note   Patient Details  Name: Suzanne Duke MRN: [id number] Date of Birth: Apr 07, 1960  [facility]) CM/SW Contact  [name], LCSW Phone Number: 10/11/2023, 2:09 PM  Clinical Narrative: Capacity documentation from [facility] placed on chart for hospitalist to complete.  Addendum: Referral sent to [facility] in [place] for review.  Expected Discharge Plan: Skilled Nursing Facility Barriers to Discharge: SNF Pending bed offer, Inadequate or no insurance, Family Issues  Expected Discharge Plan and Services In-house Referral: Clinical Social Work Discharge Planning Services: CM Consult Post Acute Care Choice: Skilled Nursing Facility Living arrangements for the past 2 months: Single Family Home             DME Arranged: N/A DME Agency: NA HH Arranged: NA HH Agency: NA  Social Drivers of Health (SDOH) Interventions SDOH Screenings   Food Insecurity: Patient Unable To Answer (09/30/2023)  Transportation Needs: Patient Unable To Answer (09/30/2023)  Depression (PHQ2-9): Low Risk  (04/12/2021)  Tobacco Use: High Risk (10/05/2023)   Readmission Risk Interventions    10/01/2023   10:07 AM  Readmission Risk Prevention Plan  Post Dischage Appt Complete  Medication Screening Complete  Transportation Screening Complete

## 2023-10-11 NOTE — Progress Notes (Signed)
 PROGRESS NOTE  [name] FMW:[id number] DOB: [birth year] DOA: 09/29/2023 PCP: Levora Reyes [name], MD   LOS: 12 days   Brief narrative: [name] is a 63 year old female with past medical history of diabetes mellitus type 2, hypertension, GERD, history of bariatric surgery, IDA, NASH, thyroid  goiter presented to hospital from home after being found on her porch for unknown amount of time.  She was seen by her neighbor and EMS was called in.  In the ED, patient was grossly confused.  It was reported that her husband moved out a few months prior due to change in her behavior and aggression.  Patient was noted to be in DKA and was started on IV fluids and insulin  drip.  She was also noted to have significant hypernatremia in the setting of volume depletion and dehydration.  Patient was then admitted hospital for further evaluation and treatment. Case has been referred to APS.  TOC on board.  Patient does have baseline disorientation but otherwise stable.  Awaiting for safe disposition.  Medically stable.   Assessment/Plan: Active Problems:   Hypernatremia   Acute metabolic encephalopathy   Normocytic anemia   Unsatisfactory living conditions   Rhabdomyolysis   Essential hypertension   GOITER, NONTOXIC MULTINODULAR   Elevated troponin   DKA, type 2 with mild hyperglycemia Resolved after insulin  drip Currently on basal insulin /sliding scale insulin , diabetic diet   Normocytic anemia Hemoglobin stable Folate low, at 3 and starting on folic acid  supplement. B12 level low/normal 390 continue vitamin B12 supplementation  Acute metabolic encephalopathy with possible underlying dementia Unclear what her baseline is and no family able to provide collateral information.  Unable to reach patient's husband despite her repeated attempts. MRI of the brain with parenchymal loss.  Continue thiamine  with low vitamin B1 level.  Ammonia within normal limits.  Likely has underlying dementia.  Patient is  oriented to self only.    Vitamin B1 deficiency Continue thiamine  orally.   Rhabdomyolysis Resolved  Initial CK9 938--> 250.  Improved.  Encourage oral hydration  Hypomagnesemia Replace as needed  Unsatisfactory living conditions Continue TOC assistance   Nontoxic multinodular goiter Prior treatment by endocrinology with stable ultrasound previously.  TSH  suppressed, 0.274.  Free T4 at 1.0.   Elevated troponin Flat troponins.   EKG with sinus rhythm.  No acute issues at this time.     DVT prophylaxis: SCDs Start: 09/29/23 1659   Disposition: Pending placement.  Unable to reach the patient's husband despite multiple attempts.  APS case as per TOC.  Medically stable for disposition  Status is: Inpatient Remains inpatient appropriate because: Pending  safe disposition    Code Status:     Code Status: Full Code  Family Communication: None available despite attempts  Consultants: None  Procedures: None  Anti-infectives:  None  Anti-infectives (From admission, onward)    None      Subjective: Met patient sitting on chair, denies any new complaints.  Objective: Vitals:   10/11/23 0550 10/11/23 1444  BP: (!) 118/94 114/61  Pulse: 86 73  Resp: 14 18  Temp: 98.2 F (36.8 C) 99.2 F (37.3 C)  SpO2: 100% 100%    Intake/Output Summary (Last 24 hours) at 10/11/2023 1755 Last data filed at 10/11/2023 1700 Gross per 24 hour  Intake 720 ml  Output 700 ml  Net 20 ml   Filed Weights   09/29/23 1228 09/29/23 1750  Weight: 81.6 kg 79.8 kg   Body mass index is 27.55 kg/m.  Physical Exam: General: NAD  Cardiovascular: S1, S2 present Respiratory: CTAB Abdomen: Soft, nontender, nondistended, bowel sounds present Musculoskeletal: No bilateral pedal edema noted Skin: Normal Psychiatry: Normal mood   Data Review: I have personally reviewed the following laboratory data and studies,  CBC: Recent Labs  Lab 10/05/23 0437 10/09/23 0406  WBC 5.8 6.8   NEUTROABS 3.4  --   HGB 10.0* 10.4*  HCT 33.9* 33.9*  MCV 91.9 89.7  PLT 136* 346   Basic Metabolic Panel: Recent Labs  Lab 10/05/23 0437 10/06/23 0347 10/09/23 0406 10/10/23 0355  NA 136 138 133* 136  K 3.9 4.0 3.9 3.7  CL 104 102 100 103  CO2 27 26 24 26   GLUCOSE 205* 177* 134* 148*  BUN 15 9 13 14   CREATININE 0.60 0.48 0.68 0.63  CALCIUM 7.8* 8.3* 8.1* 8.1*  MG 1.6*  --  1.6* 1.9   Liver Function Tests: No results for input(s): AST, ALT, ALKPHOS, BILITOT, PROT, ALBUMIN  in the last 168 hours.  No results for input(s): LIPASE, AMYLASE in the last 168 hours. No results for input(s): AMMONIA in the last 168 hours.  Cardiac Enzymes: No results for input(s): CKTOTAL, CKMB, CKMBINDEX, TROPONINI in the last 168 hours.  BNP (last 3 results) No results for input(s): BNP in the last 8760 hours.  ProBNP (last 3 results) No results for input(s): PROBNP in the last 8760 hours.  CBG: Recent Labs  Lab 10/10/23 1601 10/10/23 2101 10/11/23 0722 10/11/23 1119 10/11/23 1605  GLUCAP 126* 175* 157* 233* 161*   No results found for this or any previous visit (from the past 240 hours).   Studies: No results found.    Lebron JINNY Cage, MD  [facility] 10/11/2023  If 7PM-7AM, please contact night-coverage

## 2023-10-11 NOTE — Progress Notes (Signed)
 Mobility Specialist - Progress Note   10/11/23 1512  Mobility  Activity Ambulated with assistance  Level of Assistance Contact guard assist, steadying assist  Assistive Device Front wheel walker  Distance Ambulated (ft) 150 ft  Range of Motion/Exercises Active  Activity Response Tolerated well  Mobility Referral Yes  Mobility visit 1 Mobility  Mobility Specialist Start Time (ACUTE ONLY) 1500  Mobility Specialist Stop Time (ACUTE ONLY) 1512  Mobility Specialist Time Calculation (min) (ACUTE ONLY) 12 min   Pt was found in bed and agreeable to ambulate. Required cues for safety. At EOS returned to bed with all needs met. Call bell in reach.  Suzanne Duke,  Mobility Specialist Can be reached via Secure Chat

## 2023-10-12 LAB — GLUCOSE, CAPILLARY
Glucose-Capillary: 142 mg/dL — ABNORMAL HIGH (ref 70–99)
Glucose-Capillary: 230 mg/dL — ABNORMAL HIGH (ref 70–99)
Glucose-Capillary: 122 mg/dL — ABNORMAL HIGH (ref 70–99)
Glucose-Capillary: 146 mg/dL — ABNORMAL HIGH (ref 70–99)

## 2023-10-12 NOTE — Progress Notes (Signed)
 Mobility Specialist - Progress Note   10/12/23 1211  Therapy Vitals  Temp 98.2 F (36.8 C)  Temp Source Oral  Pulse Rate 96  Resp 16  BP 115/74  Patient Position (if appropriate) Sitting  Oxygen Therapy  SpO2 100 %  O2 Device Room Air  Mobility  Activity Ambulated with assistance  Level of Assistance Modified independent, requires aide device or extra time  Press photographer wheel walker  Distance Ambulated (ft) 200 ft  Range of Motion/Exercises Active  Activity Response Tolerated well  Mobility Referral Yes  Mobility visit 1 Mobility  Mobility Specialist Start Time (ACUTE ONLY) 1140  Mobility Specialist Stop Time (ACUTE ONLY) 1155  Mobility Specialist Time Calculation (min) (ACUTE ONLY) 15 min   Received in chair and agreed to mobility. No issues throughout session and returned to chair with all needs met.  Cyndee Ada Mobility Specialist

## 2023-10-12 NOTE — Progress Notes (Signed)
 PROGRESS NOTE  Suzanne Duke FMW:[id number] DOB: [birth year] DOA: 09/29/2023 PCP: Suzanne Reyes [name], MD   LOS: 13 days   Brief narrative: Suzanne Duke is a 63 year old female with past medical history of diabetes mellitus type 2, hypertension, GERD, history of bariatric surgery, IDA, NASH, thyroid  goiter presented to hospital from home after being found on her porch for unknown amount of time.  She was seen by her neighbor and EMS was called in.  In the ED, patient was grossly confused.  It was reported that her husband moved out a few months prior due to change in her behavior and aggression.  Patient was noted to be in DKA and was started on IV fluids and insulin  drip.  She was also noted to have significant hypernatremia in the setting of volume depletion and dehydration.  Patient was then admitted hospital for further evaluation and treatment. Case has been referred to APS.  TOC on board.  Patient does have baseline disorientation but otherwise stable.  Awaiting for safe disposition.  Medically stable.   Assessment/Plan: Active Problems:   Hypernatremia   Acute metabolic encephalopathy   Normocytic anemia   Unsatisfactory living conditions   Rhabdomyolysis   Essential hypertension   GOITER, NONTOXIC MULTINODULAR   Elevated troponin   DKA, type 2 with mild hyperglycemia Resolved after insulin  drip Currently on basal insulin /sliding scale insulin , diabetic diet   Normocytic anemia Hemoglobin stable Folate low, at 3 and starting on folic acid  supplement. B12 level low/normal 390 continue vitamin B12 supplementation  Acute metabolic encephalopathy with possible underlying dementia Unclear what her baseline is and no family able to provide collateral information.  Unable to reach patient's husband despite her repeated attempts. MRI of the brain with parenchymal loss.  Continue thiamine  with low vitamin B1 level.  Ammonia within normal limits.  Likely has underlying dementia.  Patient is  oriented to self only.    Vitamin B1 deficiency Continue thiamine  orally.   Rhabdomyolysis Resolved  Initial CK9 938--> 250.  Improved.  Encourage oral hydration  Hypomagnesemia Replace as needed  Unsatisfactory living conditions Continue TOC assistance   Nontoxic multinodular goiter Prior treatment by endocrinology with stable ultrasound previously.  TSH  suppressed, 0.274.  Free T4 at 1.0.   Elevated troponin Flat troponins.   EKG with sinus rhythm.  No acute issues at this time.     DVT prophylaxis: SCDs Start: 09/29/23 1659   Disposition: Pending placement.  Unable to reach the patient's husband despite multiple attempts.  [facility] case as per TOC.  Medically stable for disposition  Status is: Inpatient Remains inpatient appropriate because: Pending  safe disposition    Code Status:     Code Status: Full Code  Family Communication: None available despite attempts  Consultants: None  Procedures: None  Anti-infectives:  None  Anti-infectives (From admission, onward)    None      Subjective: Met patient sitting on chair, denies any new complaints.  Objective: Vitals:   10/12/23 0519 10/12/23 1211  BP: 115/60 115/74  Pulse: 65 96  Resp: 16 16  Temp: 98.3 F (36.8 C) 98.2 F (36.8 C)  SpO2: 100% 100%    Intake/Output Summary (Last 24 hours) at 10/12/2023 1846 Last data filed at 10/12/2023 1600 Gross per 24 hour  Intake 1422 ml  Output 1400 ml  Net 22 ml   Filed Weights   09/29/23 1228 09/29/23 1750  Weight: 81.6 kg 79.8 kg   Body mass index is 27.55 kg/m.   Physical  Exam: General: NAD  Cardiovascular: S1, S2 present Respiratory: CTAB Abdomen: Soft, nontender, nondistended, bowel sounds present Musculoskeletal: No bilateral pedal edema noted Skin: Normal Psychiatry: Normal mood   Data Review: I have personally reviewed the following laboratory data and studies,  CBC: Recent Labs  Lab 10/09/23 0406  WBC 6.8  HGB 10.4*  HCT  33.9*  MCV 89.7  PLT 346   Basic Metabolic Panel: Recent Labs  Lab 10/06/23 0347 10/09/23 0406 10/10/23 0355  NA 138 133* 136  K 4.0 3.9 3.7  CL 102 100 103  CO2 26 24 26   GLUCOSE 177* 134* 148*  BUN 9 13 14   CREATININE 0.48 0.68 0.63  CALCIUM 8.3* 8.1* 8.1*  MG  --  1.6* 1.9   Liver Function Tests: No results for input(s): AST, ALT, ALKPHOS, BILITOT, PROT, ALBUMIN  in the last 168 hours.  No results for input(s): LIPASE, AMYLASE in the last 168 hours. No results for input(s): AMMONIA in the last 168 hours.  Cardiac Enzymes: No results for input(s): CKTOTAL, CKMB, CKMBINDEX, TROPONINI in the last 168 hours.  BNP (last 3 results) No results for input(s): BNP in the last 8760 hours.  ProBNP (last 3 results) No results for input(s): PROBNP in the last 8760 hours.  CBG: Recent Labs  Lab 10/11/23 1605 10/11/23 2122 10/12/23 0726 10/12/23 1208 10/12/23 1621  GLUCAP 161* 135* 142* 230* 122*   No results found for this or any previous visit (from the past 240 hours).   Studies: No results found.    Lebron JINNY Cage, MD  [facility] 10/12/2023  If 7PM-7AM, please contact night-coverage

## 2023-10-13 LAB — GLUCOSE, CAPILLARY
Glucose-Capillary: 131 mg/dL — ABNORMAL HIGH (ref 70–99)
Glucose-Capillary: 231 mg/dL — ABNORMAL HIGH (ref 70–99)
Glucose-Capillary: 275 mg/dL — ABNORMAL HIGH (ref 70–99)
Glucose-Capillary: 109 mg/dL — ABNORMAL HIGH (ref 70–99)

## 2023-10-13 NOTE — Progress Notes (Signed)
 PROGRESS NOTE  [name] FMW:[id number] DOB: 21-Jan-1961 DOA: 09/29/2023 PCP: Levora Reyes [name], MD   LOS: 14 days   Brief narrative: [name] is a 63 year old female with past medical history of diabetes mellitus type 2, hypertension, GERD, history of bariatric surgery, IDA, NASH, thyroid  goiter presented to hospital from home after being found on her porch for unknown amount of time.  She was seen by her neighbor and EMS was called in.  In the ED, patient was grossly confused.  It was reported that her husband moved out a few months prior due to change in her behavior and aggression.  Patient was noted to be in DKA and was started on IV fluids and insulin  drip.  She was also noted to have significant hypernatremia in the setting of volume depletion and dehydration.  Patient was then admitted hospital for further evaluation and treatment. Case has been referred to APS.  TOC on board.  Patient does have baseline disorientation but otherwise stable.  Awaiting for safe disposition.  Medically stable.   Assessment/Plan: Active Problems:   Hypernatremia   Acute metabolic encephalopathy   Normocytic anemia   Unsatisfactory living conditions   Rhabdomyolysis   Essential hypertension   GOITER, NONTOXIC MULTINODULAR   Elevated troponin   DKA, type 2 with mild hyperglycemia Resolved after insulin  drip Currently on basal insulin /sliding scale insulin , diabetic diet   Normocytic anemia Hemoglobin stable Folate low, at 3 and starting on folic acid  supplement. B12 level low/normal 390 continue vitamin B12 supplementation  Acute metabolic encephalopathy with possible underlying dementia Unclear what her baseline is and no family able to provide collateral information.  Unable to reach patient's husband despite her repeated attempts. MRI of the brain with parenchymal loss.  Continue thiamine  with low vitamin B1 level.  Ammonia within normal limits.  Likely has underlying dementia.  Patient is  oriented to self only.    Vitamin B1 deficiency Continue thiamine  orally.   Rhabdomyolysis Resolved  Initial CK9 938--> 250.  Improved.  Encourage oral hydration  Hypomagnesemia Replace as needed  Unsatisfactory living conditions Continue TOC assistance   Nontoxic multinodular goiter Prior treatment by endocrinology with stable ultrasound previously.  TSH  suppressed, 0.274.  Free T4 at 1.0.   Elevated troponin Flat troponins.   EKG with sinus rhythm.  No acute issues at this time.     DVT prophylaxis: SCDs Start: 09/29/23 1659   Disposition: Pending placement.  Unable to reach the patient's husband despite multiple attempts.  APS case as per TOC.  Medically stable for disposition  Status is: Inpatient Remains inpatient appropriate because: Pending  safe disposition    Code Status:     Code Status: Full Code  Family Communication: None available despite attempts  Consultants: None  Procedures: None  Anti-infectives:  None  Anti-infectives (From admission, onward)    None      Subjective: Denies any new complaints.  Objective: Vitals:   10/13/23 0552 10/13/23 1328  BP: 102/65 128/73  Pulse: 71 76  Resp: 18 18  Temp: 98 F (36.7 C) 98.5 F (36.9 C)  SpO2: 100% 99%    Intake/Output Summary (Last 24 hours) at 10/13/2023 1641 Last data filed at 10/13/2023 1200 Gross per 24 hour  Intake 960 ml  Output 1150 ml  Net -190 ml   Filed Weights   09/29/23 1228 09/29/23 1750  Weight: 81.6 kg 79.8 kg   Body mass index is 27.55 kg/m.   Physical Exam: General: NAD  Cardiovascular:  S1, S2 present Respiratory: CTAB Abdomen: Soft, nontender, nondistended, bowel sounds present Musculoskeletal: No bilateral pedal edema noted Skin: Normal Psychiatry: Normal mood   Data Review: I have personally reviewed the following laboratory data and studies,  CBC: Recent Labs  Lab 10/09/23 0406  WBC 6.8  HGB 10.4*  HCT 33.9*  MCV 89.7  PLT 346   Basic  Metabolic Panel: Recent Labs  Lab 10/09/23 0406 10/10/23 0355  NA 133* 136  K 3.9 3.7  CL 100 103  CO2 24 26  GLUCOSE 134* 148*  BUN 13 14  CREATININE 0.68 0.63  CALCIUM 8.1* 8.1*  MG 1.6* 1.9   Liver Function Tests: No results for input(s): AST, ALT, ALKPHOS, BILITOT, PROT, ALBUMIN  in the last 168 hours.  No results for input(s): LIPASE, AMYLASE in the last 168 hours. No results for input(s): AMMONIA in the last 168 hours.  Cardiac Enzymes: No results for input(s): CKTOTAL, CKMB, CKMBINDEX, TROPONINI in the last 168 hours.  BNP (last 3 results) No results for input(s): BNP in the last 8760 hours.  ProBNP (last 3 results) No results for input(s): PROBNP in the last 8760 hours.  CBG: Recent Labs  Lab 10/12/23 1621 10/12/23 2145 10/13/23 0754 10/13/23 1133 10/13/23 1637  GLUCAP 122* 146* 131* 231* 275*   No results found for this or any previous visit (from the past 240 hours).   Studies: No results found.    Lebron JINNY Cage, MD  [facility] 10/13/2023  If 7PM-7AM, please contact night-coverage

## 2023-10-13 NOTE — Progress Notes (Signed)
 Physical Therapy Treatment Patient Details Name: Suzanne Duke MRN: [id number] DOB: Jun 01, 1960 Today's Date: 10/13/2023   History of Present Illness Patient is a 63 y/o female admitted 09/29/23 from home after spouse found her down on her porch soiled in urine and bugs crawling on her. Found to be dehydrated, confused, in DKA and with hypernatremia.  PMH positive for HNT, DM, GERD, anxiety, depression, s/p bariatric surgery, IDA, HASH, thyroid  goiter.    PT Comments   Pt seen to progress mobility and activity tolerance. Pt is making steady gains toward goals however cognitive status/memory will remain barrier to reaching mod I level. Pt will likely need eventual LTC/memory care. Will continue to follow in acute setting   If plan is discharge home, recommend the following: A little help with bathing/dressing/bathroom;Assist for transportation;Help with stairs or ramp for entrance;Supervision due to cognitive status   Can travel by private vehicle     Yes  Equipment Recommendations  Rolling walker (2 wheels)    Recommendations for Other Services       Precautions / Restrictions Precautions Precautions: Fall Recall of Precautions/Restrictions: Impaired Restrictions Weight Bearing Restrictions Per Provider Order: No     Mobility  Bed Mobility Overal bed mobility: Needs Assistance Bed Mobility: Supine to Sit, Sit to Supine     Supine to sit: Supervision, HOB elevated, Used rails Sit to supine: Supervision, Used rails   General bed mobility comments: cues to initiate. slightly incr time, no physical assist    Transfers Overall transfer level: Needs assistance Equipment used: Rolling walker (2 wheels) Transfers: Sit to/from Stand, Bed to chair/wheelchair/BSC Sit to Stand: Contact guard assist, Supervision           General transfer comment: cues for safety and hand placement    Ambulation/Gait Ambulation/Gait assistance: Supervision, Contact guard assist Gait Distance  (Feet): 120 Feet Assistive device: Rolling walker (2 wheels) Gait Pattern/deviations: Step-through pattern, Drifts right/left       General Gait Details: No LOB with use of RW. Able to negotiate objects, slight R/L drift. no LOB noted   Stairs             Wheelchair Mobility     Tilt Bed    Modified Rankin (Stroke Patients Only)       Balance Overall balance assessment: Needs assistance Sitting-balance support: No upper extremity supported, Feet supported Sitting balance-Leahy Scale: Good     Standing balance support: Bilateral upper extremity supported, During functional activity, Reliant on assistive device for balance Standing balance-Leahy Scale: Fair Standing balance comment: Pt requires reminders to use RW for support, high fall risk without use of Assistive Device; RN reports pt amb into hallway on her own without device earlier today             High level balance activites: Side stepping, Direction changes, Turns, Head turns High Level Balance Comments: no LOB wiht RW support            Communication Communication Communication: No apparent difficulties  Cognition Arousal: Alert Behavior During Therapy: WFL for tasks assessed/performed   PT - Cognitive impairments: History of cognitive impairments   Orientation impairments: Place, Time, Situation                     Following commands: Impaired Following commands impaired: Follows one step commands with increased time    Cueing Cueing Techniques: Verbal cues, Gestural cues  Exercises      General Comments  Pertinent Vitals/Pain Pain Assessment Pain Assessment: No/denies pain    Home Living                          Prior Function            PT Goals (current goals can now be found in the care plan section) Acute Rehab PT Goals PT Goal Formulation: Patient unable to participate in goal setting Time For Goal Achievement: 10/15/23 Potential to Achieve  Goals: Good Progress towards PT goals: Progressing toward goals    Frequency    Min 2X/week      PT Plan      Co-evaluation              AM-PAC PT 6 Clicks Mobility   Outcome Measure  Help needed turning from your back to your side while in a flat bed without using bedrails?: None Help needed moving from lying on your back to sitting on the side of a flat bed without using bedrails?: None Help needed moving to and from a bed to a chair (including a wheelchair)?: None Help needed standing up from a chair using your arms (e.g., wheelchair or bedside chair)?: None Help needed to walk in hospital room?: A Little Help needed climbing 3-5 steps with a railing? : A Little 6 Click Score: 22    End of Session Equipment Utilized During Treatment: Gait belt Activity Tolerance: Patient tolerated treatment well Patient left: in bed;with call bell/phone within reach;with bed alarm set   PT Visit Diagnosis: Other abnormalities of gait and mobility (R26.89);Muscle weakness (generalized) (M62.81);History of falling (Z91.81);Other symptoms and signs involving the nervous system (R29.898)     Time: 8389-8378 PT Time Calculation (min) (ACUTE ONLY): 11 min  Charges:    $Gait Training: 8-22 mins PT General Charges $$ ACUTE PT VISIT: 1 Visit                     Zigmund Linse, PT  Acute Rehab Dept [facility]) [phone]  10/13/2023    [facility] 10/13/2023, 5:12 PM

## 2023-10-13 NOTE — Progress Notes (Signed)
 Occupational Therapy Treatment Patient Details Name: Suzanne Duke MRN: [id number] DOB: 1960/05/27 Today's Date: 10/13/2023   History of present illness Patient is a 63 y/o female admitted 09/29/23 from home after spouse found her down on her porch soiled in urine and bugs crawling on her. Found to be dehydrated, confused, in DKA and with hypernatremia.  PMH positive for HNT, DM, GERD, anxiety, depression, s/p bariatric surgery, IDA, HASH, thyroid  goiter.   OT comments  Patient seen in order to progress with functional mobility, activity tolerance, and ADL independence. Patient remains pleasantly confused, with cues to sequence upper body bathing and dressing appropriately. Patient able to ambulate to the bathroom at [facility], but unaware of BM on hands requiring assist from OT. Patient in recliner at end of session with all needs met. OT recommendation remains appropriate, with goals created in care plan to reflect progress. OT will continue to follow.       If plan is discharge home, recommend the following:  A little help with walking and/or transfers;A little help with bathing/dressing/bathroom;Assistance with cooking/housework;Direct supervision/assist for medications management;Direct supervision/assist for financial management;Assist for transportation;Supervision due to cognitive status   Equipment Recommendations  Other (comment) (TBD post rehab)    Recommendations for Other Services      Precautions / Restrictions Precautions Precautions: Fall Recall of Precautions/Restrictions: Impaired Restrictions Weight Bearing Restrictions Per Provider Order: No       Mobility Bed Mobility Overal bed mobility: Needs Assistance Bed Mobility: Supine to Sit     Supine to sit: Supervision, HOB elevated, Used rails     General bed mobility comments: minimal increased time    Transfers Overall transfer level: Needs assistance Equipment used: Rolling walker (2 wheels) Transfers: Sit  to/from Stand, Bed to chair/wheelchair/BSC Sit to Stand: Contact guard assist           General transfer comment: cues for safety and hand placement     Balance Overall balance assessment: Needs assistance Sitting-balance support: No upper extremity supported, Feet supported Sitting balance-Leahy Scale: Good     Standing balance support: Bilateral upper extremity supported, During functional activity, Reliant on assistive device for balance Standing balance-Leahy Scale: Fair Standing balance comment: Pt requires reminders to use RW for support, high fall risk without use of Assistive Device                           ADL either performed or assessed with clinical judgement   ADL Overall ADL's : Needs assistance/impaired     Grooming: Wash/dry hands;Wash/dry face;Oral care;Applying deodorant;Contact guard assist;Standing;Cueing for sequencing;Cueing for safety;Sitting Grooming Details (indicate cue type and reason): seated and standing level grooming and bathiing on commode sinkside with tolerace up until 2 minute intervals Upper Body Bathing: Set up;Standing;Cueing for sequencing       Upper Body Dressing : Sitting;Cueing for sequencing;Minimal assistance       Toilet Transfer: Minimal assistance;Rolling walker (2 wheels);Regular Toilet;Ambulation;Grab bars;Cueing for safety;Cueing for sequencing Toilet Transfer Details (indicate cue type and reason): amb to and from toilet with cues for safety Toileting- Clothing Manipulation and Hygiene: Contact guard assist;Sit to/from stand       Functional mobility during ADLs: Minimal assistance;Rolling walker (2 wheels) General ADL Comments: Patient seen in order to progress with functional mobility, activity tolerance, and ADL independence. Patient remains pleasantly confused, with cues to sequence upper body bathing and dressing appropriately. Patient able to ambulate to the bathroom at [facility], but unaware of BM  on hands  requiring assist from OT. Patient in recliner at end of session with all needs met. OT recommendation remains appropriate, with goals created in care plan to reflect progress. OT will continue to follow.    Extremity/Trunk Assessment              Vision   Vision Assessment?: No apparent visual deficits   Perception     Praxis     Communication Communication Communication: No apparent difficulties   Cognition Arousal: Alert Behavior During Therapy: WFL for tasks assessed/performed Cognition: Cognition impaired   Orientation impairments: Situation, Time Awareness: Intellectual awareness impaired, Online awareness impaired Memory impairment (select all impairments): Short-term memory, Declarative long-term memory Attention impairment (select first level of impairment): Sustained attention Executive functioning impairment (select all impairments): Reasoning, Problem solving, Sequencing, Initiation, Organization OT - Cognition Comments: Pleasantly confused, needed cues to sequence basic ADLs like upper body bathing and washing hands after BM                 Following commands: Impaired Following commands impaired: Follows one step commands inconsistently      Cueing   Cueing Techniques: Verbal cues, Gestural cues  Exercises      Shoulder Instructions       General Comments      Pertinent Vitals/ Pain       Pain Assessment Pain Assessment: Faces Faces Pain Scale: Hurts little more Pain Location: rectum Pain Descriptors / Indicators: Grimacing Pain Intervention(s): Limited activity within patient's tolerance, Monitored during session, Repositioned  Home Living                                          Prior Functioning/Environment              Frequency  Min 2X/week        Progress Toward Goals  OT Goals(current goals can now be found in the care plan section)  Progress towards OT goals: Progressing toward goals  Acute Rehab  OT Goals Patient Stated Goal: to get better OT Goal Formulation: With patient Time For Goal Achievement: 10/27/23 Potential to Achieve Goals: Fair ADL Goals Pt Will Perform Lower Body Bathing: with modified independence;sitting/lateral leans;sit to/from stand Pt Will Perform Lower Body Dressing: with modified independence;sit to/from stand;sitting/lateral leans Pt Will Transfer to Toilet: with modified independence;ambulating;regular height toilet Pt Will Perform Toileting - Clothing Manipulation and hygiene: with modified independence;sitting/lateral leans;sit to/from stand Additional ADL Goal #1: Patient will be able to follow 2 step commands consistently as a precursor to potential higher level cognitive tasks. Additional ADL Goal #2: Patient will be able to complete functional task in standing for 5 minutes in order to increase activity tolerance.  Plan      Co-evaluation                 AM-PAC OT 6 Clicks Daily Activity     Outcome Measure   Help from another person eating meals?: A Little Help from another person taking care of personal grooming?: A Little Help from another person toileting, which includes using toliet, bedpan, or urinal?: A Lot Help from another person bathing (including washing, rinsing, drying)?: A Lot Help from another person to put on and taking off regular upper body clothing?: A Little Help from another person to put on and taking off regular lower body clothing?: A Lot 6 Click Score: 15  End of Session Equipment Utilized During Treatment: Rolling walker (2 wheels)  OT Visit Diagnosis: Unsteadiness on feet (R26.81);History of falling (Z91.81);Muscle weakness (generalized) (M62.81);Cognitive communication deficit (R41.841) Symptoms and signs involving cognitive functions: Other cerebrovascular disease   Activity Tolerance Patient tolerated treatment well   Patient Left in chair;with call bell/phone within reach;with chair alarm set   Nurse  Communication Mobility status;Other (comment) (complaining of hard stool)        Time: 8968-8942 OT Time Calculation (min): 26 min  Charges: OT General Charges $OT Visit: 1 Visit OT Treatments $Self Care/Home Management : 23-37 mins  Ronal Gift E. Tresha Muzio, OTR/L Acute Rehabilitation Services [phone]   Ronal Gift Salt 10/13/2023, 11:07 AM

## 2023-10-14 LAB — BASIC METABOLIC PANEL WITH GFR
Anion gap: 7 (ref 5–15)
BUN: 14 mg/dL (ref 8–23)
CO2: 26 mmol/L (ref 22–32)
Calcium: 8.2 mg/dL — ABNORMAL LOW (ref 8.9–10.3)
Chloride: 104 mmol/L (ref 98–111)
Creatinine, Ser: 0.61 mg/dL (ref 0.44–1.00)
GFR, Estimated: >60 mL/min (ref >60)
Glucose, Bld: 126 mg/dL — ABNORMAL HIGH (ref 70–99)
Potassium: 4.1 mmol/L (ref 3.5–5.1)
Sodium: 137 mmol/L (ref 135–145)

## 2023-10-14 LAB — GLUCOSE, CAPILLARY
Glucose-Capillary: 165 mg/dL — ABNORMAL HIGH (ref 70–99)
Glucose-Capillary: 206 mg/dL — ABNORMAL HIGH (ref 70–99)
Glucose-Capillary: 199 mg/dL — ABNORMAL HIGH (ref 70–99)

## 2023-10-14 LAB — CBC
HCT: 31.4 % — ABNORMAL LOW (ref 36.0–46.0)
Hemoglobin: 9.8 g/dL — ABNORMAL LOW (ref 12.0–15.0)
MCH: 28.5 pg (ref 26.0–34.0)
MCHC: 31.2 g/dL (ref 30.0–36.0)
MCV: 91.3 fL (ref 80.0–100.0)
Platelets: 319 K/uL (ref 150–400)
RBC: 3.44 MIL/uL — ABNORMAL LOW (ref 3.87–5.11)
RDW: 13.7 % (ref 11.5–15.5)
WBC: 4.3 K/uL (ref 4.0–10.5)
nRBC: 0 % (ref 0.0–0.2)

## 2023-10-14 MED ORDER — VITAMIN B-1 100 MG PO TABS: 100.0000 mg | ORAL_TABLET | Freq: Every day | ORAL | Status: AC

## 2023-10-14 MED ORDER — FOLIC ACID 1 MG PO TABS
1.0000 mg | ORAL_TABLET | Freq: Every day | ORAL | Status: AC
Start: 2023-10-15 — End: ?

## 2023-10-14 MED ORDER — INSULIN GLARGINE-YFGN 100 UNIT/ML ~~LOC~~ SOLN: 10.0000 [IU] | Freq: Every day | SUBCUTANEOUS | Status: AC

## 2023-10-14 MED ORDER — ENSURE PLUS HIGH PROTEIN PO LIQD: 237.0000 mL | Freq: Two times a day (BID) | ORAL | Status: AC

## 2023-10-14 MED ORDER — DOCUSATE SODIUM 100 MG PO CAPS: 100.0000 mg | ORAL_CAPSULE | Freq: Two times a day (BID) | ORAL | Status: AC | PRN

## 2023-10-14 MED ORDER — MEDIHONEY WOUND/BURN DRESSING EX PSTE: 1.0000 | PASTE | Freq: Every day | CUTANEOUS | Status: AC

## 2023-10-14 MED ORDER — ONE-DAILY MULTI VITAMINS PO TABS: 1.0000 | ORAL_TABLET | Freq: Every day | ORAL | Status: AC

## 2023-10-14 MED ORDER — CYANOCOBALAMIN 1000 MCG PO TABS: 1000.0000 ug | ORAL_TABLET | Freq: Every day | ORAL | Status: AC

## 2023-10-14 NOTE — TOC Progression Note (Signed)
 Transition of Care [facility]) - Progression Note   Patient Details  Name: Suzanne Duke MRN: [id number] Date of Birth: 13-Jul-1960  Transition of Care [facility]) CM/SW Contact  [name], LCSW Phone Number: 10/14/2023, 4:08 PM  Clinical Narrative: CSW confirmed with Ricky in admissions at [facility] in [place] that patient can be admitted today provided patient's husband, Arizona Sorn, signs admission paperwork. Case staff with Care Management director, Zack, to discuss admission issues and he confirmed the husband will have to sign the paperwork as APS does not have guardianship of the patient at this time. CSW left voicemail and emailed Ms. Jori, patient's APS caseworker regarding placement. CSW made multiple attempts to reach husband and left voicemail, but did not hear back. Director called husband and explained he will need to complete admission paperwork on behalf of patient. Husband reported he would be to the hospital around 4pm to complete paperwork.  CSW assisted husband with admission paperwork which was emailed to [place] in admissions. Patient will go to room F9 and the number for report is [phone]. Discharge packet completed. RN updated. PTAR scheduled. Care management signing off.  Expected Discharge Plan: Skilled Nursing Facility Barriers to Discharge: Family Issues (Husband needs to sign admission paperwork for SNF.)               Expected Discharge Plan and Services In-house Referral: Clinical Social Work Discharge Planning Services: CM Consult Post Acute Care Choice: Skilled Nursing Facility Living arrangements for the past 2 months: Single Family Home Expected Discharge Date: 10/14/23               DME Arranged: N/A DME Agency: NA       HH Arranged: NA HH Agency: NA         Social Drivers of Health (SDOH) Interventions SDOH Screenings   Food Insecurity: Patient Unable To Answer (09/30/2023)  Transportation Needs: Patient Unable To Answer (09/30/2023)   Depression (PHQ2-9): Low Risk  (04/12/2021)  Tobacco Use: High Risk (10/05/2023)    Readmission Risk Interventions    10/01/2023   10:07 AM  Readmission Risk Prevention Plan  Post Dischage Appt Complete  Medication Screening Complete  Transportation Screening Complete

## 2023-10-14 NOTE — Progress Notes (Signed)
 Mobility Specialist - Progress Note   10/14/23 1030  Mobility  Activity Ambulated with assistance  Level of Assistance Contact guard assist, steadying assist  Assistive Device Front wheel walker  Distance Ambulated (ft) 150 ft  Range of Motion/Exercises Active  Activity Response Tolerated well  Mobility Referral Yes  Mobility visit 1 Mobility  Mobility Specialist Start Time (ACUTE ONLY) 1015  Mobility Specialist Stop Time (ACUTE ONLY) 1030  Mobility Specialist Time Calculation (min) (ACUTE ONLY) 15 min   Pt was found in bed and agreeable to ambulate. Required safety cues and at EOS returned to recliner chair with all needs met. Call bell in reach and chair alarm on. NT in room.  Erminio Leos,  Mobility Specialist Can be reached via Secure Chat

## 2023-10-14 NOTE — NC FL2 (Signed)
 [place]  MEDICAID FL2 LEVEL OF CARE FORM     IDENTIFICATION  Patient Name: Suzanne Duke Birthdate: 09/15/1960 Sex: female Admission Date (Current Location): 09/29/2023  [facility] and [place] Number:  Lobbyist and Address:  [facility],  [address], [place] [address]      Provider Number: [id number]  Attending Physician Name and Address:  Suzanne Lebron PARAS, MD  Relative Name and Phone Number:  Suzanne Duke (spouse) Ph: [phone]    Current Level of Care: Hospital Recommended Level of Care: Skilled Nursing Facility Prior Approval Number:    Date Approved/Denied:   PASRR Number: [id number]  Discharge Plan: SNF (Memory care)    Current Diagnoses: Patient Active Problem List   Diagnosis Date Noted   Normocytic anemia 10/01/2023   Rhabdomyolysis 10/01/2023   Elevated troponin 09/30/2023   Anxiety and depression 09/29/2023   Hypernatremia 09/29/2023   Acute metabolic encephalopathy 09/29/2023   Unsatisfactory living conditions 09/29/2023   Reactive airway disease 09/18/2018   Liver disorder 02/02/2014   NASH (nonalcoholic steatohepatitis) 11/28/2013   Arthralgia 08/27/2011   Left hand pain 08/27/2011   Encounter for long-term (current) use of other medications 08/27/2011   GERD 05/11/2010   BARIATRIC SURGERY STATUS 06/01/2009   Diabetes (HCC) 07/17/2007   ANEMIA-IRON DEFICIENCY 02/17/2007   ANXIETY 02/17/2007   DEPRESSION 02/17/2007   HICCUPS 02/17/2007   LUMBAR RADICULOPATHY 01/13/2007   GOITER, NONTOXIC MULTINODULAR 01/07/2007   UNSPECIFIED NEURALGIA NEURITIS AND RADICULITIS 01/07/2007   Essential hypertension 09/21/2006    Orientation RESPIRATION BLADDER Height & Weight     Self  Normal Incontinent Weight: 175 lb 14.8 oz (79.8 kg) Height:  5' 7 (170.2 cm)  BEHAVIORAL SYMPTOMS/MOOD NEUROLOGICAL BOWEL NUTRITION STATUS      Continent Diet (Card modified)  AMBULATORY STATUS COMMUNICATION OF NEEDS Skin   Limited Assist  Verbally Other (Comment) (Ecchymosis: right thigh; Erythema: buttocks)                       Personal Care Assistance Level of Assistance  Bathing, Feeding, Dressing Bathing Assistance: Limited assistance Feeding assistance: Limited assistance Dressing Assistance: Limited assistance     Functional Limitations Info  Sight, Hearing, Speech Sight Info: Impaired Hearing Info: Impaired Speech Info: Adequate    SPECIAL CARE FACTORS FREQUENCY                       Contractures Contractures Info: Not present    Additional Factors Info  Code Status, Allergies Code Status Info: Full Allergies Info: Sulfonamide Derivatives           Current Medications (10/14/2023):  This is the current hospital active medication list Current Facility-Administered Medications  Medication Dose Route Frequency Provider Last Rate Last Admin   acetaminophen  (TYLENOL ) tablet 650 mg  650 mg Oral Q6H PRN Suzanne Lenis, MD   650 mg at 10/09/23 2133   Or   acetaminophen  (TYLENOL ) suppository 650 mg  650 mg Rectal Q6H PRN Suzanne Lenis, MD       Chlorhexidine  Gluconate Cloth 2 % PADS 6 each  6 each Topical Daily Suzanne Lenis, MD   6 each at 10/12/23 9165   cyanocobalamin  (VITAMIN B12) tablet 1,000 mcg  1,000 mcg Oral Daily Suzanne Lenis, MD   1,000 mcg at 10/14/23 9177   dextrose  50 % solution 0-50 mL  0-50 mL Intravenous PRN Suzanne Lenis, MD       docusate sodium  (COLACE) capsule 100 mg  100  mg Oral BID Duke, Laxman, MD   100 mg at 10/14/23 9177   feeding supplement (ENSURE PLUS HIGH PROTEIN) liquid 237 mL  237 mL Oral BID BM Suzanne Lenis, MD   237 mL at 10/14/23 0915   folic acid  (FOLVITE ) tablet 1 mg  1 mg Oral Daily Suzanne Lenis, MD   1 mg at 10/14/23 9177   insulin  aspart (novoLOG ) injection 0-15 Units  0-15 Units Subcutaneous TID WC Suzanne Lenis, MD   5 Units at 10/14/23 1225   insulin  aspart (novoLOG ) injection 0-5 Units  0-5 Units Subcutaneous QHS Suzanne Lenis, MD   3 Units  at 10/08/23 2210   insulin  aspart (novoLOG ) injection 2 Units  2 Units Subcutaneous TID WC Duke, Laxman, MD   2 Units at 10/14/23 1226   insulin  glargine-yfgn (SEMGLEE ) injection 10 Units  10 Units Subcutaneous Daily Suzanne Lenis, MD   10 Units at 10/14/23 9177   leptospermum manuka honey (MEDIHONEY) paste 1 Application  1 Application Topical Daily Suzanne Lenis, MD   1 Application at 10/14/23 1243   lip balm (CARMEX) ointment   Topical PRN Suzanne Lenis, MD       magnesium  oxide (MAG-OX) tablet 400 mg  400 mg Oral BID Duke, Laxman, MD   400 mg at 10/14/23 9177   multivitamin with minerals tablet 1 tablet  1 tablet Oral Daily Suzanne Lenis, MD   1 tablet at 10/14/23 9177   ondansetron  (ZOFRAN ) tablet 4 mg  4 mg Oral Q6H PRN Suzanne Lenis, MD       Or   ondansetron  (ZOFRAN ) injection 4 mg  4 mg Intravenous Q6H PRN Suzanne Lenis, MD       Oral care mouth rinse  15 mL Mouth Rinse PRN Suzanne Lenis, MD       polyethylene glycol (MIRALAX  / GLYCOLAX ) packet 17 g  17 g Oral Daily PRN Duke, Laxman, MD   17 g at 10/12/23 1014   thiamine  (VITAMIN B1) tablet 100 mg  100 mg Oral Daily Suzanne Lenis, MD   100 mg at 10/14/23 9177     Discharge Medications: Please see discharge summary for a list of discharge medications.  Relevant Imaging Results:  Relevant Lab Results:   Additional Information SSN: [id number]  Suzanne GORMAN Aran, LCSW

## 2023-10-14 NOTE — Discharge Summary (Signed)
 Physician Discharge Summary   Patient: Suzanne Duke MRN: [id number] DOB: 1960/08/23  Admit date:     09/29/2023  Discharge date: 10/14/23  Discharge Physician: Lebron JINNY Cage   PCP: Levora Reyes [name], MD   Recommendations at discharge:   Follow-up with PCP  Discharge Diagnoses: Active Problems:   Hypernatremia   Acute metabolic encephalopathy   Normocytic anemia   Unsatisfactory living conditions   Rhabdomyolysis   Essential hypertension   GOITER, NONTOXIC MULTINODULAR   Elevated troponin    Hospital Course: Suzanne Duke is a 63 year old female with past medical history of diabetes mellitus type 2, hypertension, GERD, history of bariatric surgery, IDA, NASH, thyroid  goiter presented to hospital from home after being found on her porch for unknown amount of time. She was seen by her neighbor and EMS was called in. In the ED, patient was grossly confused. It was reported that her husband moved out a few months prior due to change in her behavior and aggression. Patient was noted to be in DKA and was started on IV fluids and insulin  drip. She was also noted to have significant hypernatremia in the setting of volume depletion and dehydration. Patient was then admitted hospital for further evaluation and treatment. Case has been referred to APS. TOC on board. Patient does have baseline disorientation but otherwise stable. Awaiting for safe disposition. Medically stable.     Today, met patient enjoying her breakfast.  Denied any new complaints.  Patient stable to discharge to SNF.    Assessment and Plan:  DKA, type 2 with mild hyperglycemia Resolved after insulin  drip plus IV fluids Discharged on p.o. metformin , plus insulin  Semglee  10 units daily Daily CBGs   Normocytic anemia Hemoglobin stable Folate low, at 3 and starting on folic acid  supplement. B12 level low/normal 390 Continue vitamin B12 supplementation   Acute metabolic encephalopathy with possible underlying  dementia Unclear what her baseline is and no family able to provide collateral information MRI of the brain with parenchymal loss Continue thiamine  with low vitamin B1 level Patient is oriented to self only.     Vitamin B1 deficiency Continue thiamine  orally   Rhabdomyolysis Resolved  Initial CK9 938--> 250.  Improved.  Encourage oral hydration   Hypomagnesemia Replaced as needed   Unsatisfactory living conditions Discharge to SNF   Nontoxic multinodular goiter Prior treatment by endocrinology with stable ultrasound previously TSH  suppressed, 0.274.  Free T4 at 1.0.   Elevated troponin Denies any chest pain, looks comfortable Flat troponins.   EKG with sinus rhythm       Consultants: None Procedures performed: None Disposition: Skilled nursing facility Diet recommendation:  Carb modified diet   DISCHARGE MEDICATION: Allergies as of 10/14/2023       Reactions   Sulfonamide Derivatives         Medication List     STOP taking these medications    losartan  100 MG tablet Commonly known as: COZAAR        TAKE these medications    cyanocobalamin  1000 MCG tablet Take 1 tablet (1,000 mcg total) by mouth daily. Start taking on: October 15, 2023   docusate sodium  100 MG capsule Commonly known as: COLACE Take 1 capsule (100 mg total) by mouth 2 (two) times daily as needed for mild constipation.   feeding supplement Liqd Take 237 mLs by mouth 2 (two) times daily between meals.   folic acid  1 MG tablet Commonly known as: FOLVITE  Take 1 tablet (1 mg total) by mouth daily. Start  taking on: October 15, 2023   insulin  glargine-yfgn 100 UNIT/ML injection Commonly known as: SEMGLEE  Inject 0.1 mLs (10 Units total) into the skin daily. Start taking on: October 15, 2023   leptospermum manuka honey Pste paste Apply 1 Application topically daily. Start taking on: October 15, 2023   metFORMIN  500 MG tablet Commonly known as: GLUCOPHAGE  TAKE 1 TABLET BY MOUTH  TWICE A DAY WITH FOOD   multivitamin tablet Take 1 tablet by mouth daily. What changed: when to take this   thiamine  100 MG tablet Commonly known as: Vitamin B-1 Take 1 tablet (100 mg total) by mouth daily. Start taking on: October 15, 2023        Contact information for follow-up providers     Levora Reyes SAUNDERS, MD. Schedule an appointment as soon as possible for a visit in 1 week(s).   Specialties: Family Medicine, Sports Medicine Contact information: [address] [place] [address] [phone]              Contact information for after-discharge care     Destination     [facility] .   Service: Skilled Nursing Contact information: [address] [place]  [address] [phone]                    Discharge Exam: Suzanne Duke   09/29/23 1228 09/29/23 1750  Weight: 81.6 kg 79.8 kg   General: NAD, awake, alert, not oriented Cardiovascular: S1, S2 present Respiratory: CTAB Abdomen: Soft, nontender, nondistended, bowel sounds present Musculoskeletal: No bilateral pedal edema noted Skin: Normal Psychiatry: Normal mood   Condition at discharge: stable  The results of significant diagnostics from this hospitalization (including imaging, microbiology, ancillary and laboratory) are listed below for reference.   Imaging Studies: MR BRAIN WO CONTRAST Result Date: 09/30/2023 EXAM: MRI BRAIN WITHOUT CONTRAST 09/30/2023 11:35:00 AM TECHNIQUE: Multiplanar multisequence MRI of the head/brain was performed without the administration of intravenous contrast. COMPARISON: CT head earlier same day. CLINICAL HISTORY: Delirium; worsening confusion, hallucinations, found down. Has some aphasia. Unknown about PPM. FINDINGS: BRAIN AND VENTRICLES: No acute infarct. No intracranial hemorrhage. No mass. No midline shift. No hydrocephalus. The sella is unremarkable. Normal flow voids. Generalized parenchymal volume loss. ORBITS:  No acute abnormality. SINUSES AND MASTOIDS: No acute abnormality. BONES AND SOFT TISSUES: Normal marrow signal. No acute soft tissue abnormality. LIMITATIONS/ARTIFACTS: The examination is slightly limited due to motion artifact. IMPRESSION: 1. No acute intracranial abnormality. 2. Generalized parenchymal volume loss. Electronically signed by: Donnice Mania MD 09/30/2023 12:03 PM EDT RP Workstation: [id number]   DG Pelvis 1-2 Views Result Date: 09/30/2023 EXAM: 1 or 2 VIEW(S) XRAY OF THE PELVIS 09/30/2023 07:58:00 AM COMPARISON: 06/14/2012. Stable surgical clips in the pelvis. CLINICAL HISTORY: 717754 FB GI (foreign body in gastrointestinal tract) 717754. 717754 FB GI (foreign body in gastrointestinal tract) 717754 ; Clearing film for MRI FB GI (foreign body in gastrointestinal tract) FINDINGS: BONES AND JOINTS: No acute fracture. No focal osseous lesion. No joint dislocation. Degenerative changes in the visualized lower lumbar spine. SOFT TISSUES: The soft tissues are unremarkable. Iliofemoral arterial calcifications. No radiodense foreign body. IMPRESSION: 1. No radiodense foreign body. Electronically signed by: Katheleen Faes MD 09/30/2023 08:18 AM EDT RP Workstation: [id number]   DG Abd 1 View Result Date: 09/30/2023 EXAM: 1 VIEW XRAY OF THE ABDOMEN 09/30/2023 07:58:00 AM COMPARISON: 06/23/2006 CLINICAL HISTORY: 717754 FB GI (foreign body in gastrointestinal tract) 717754. 717754 FB GI (foreign body in  gastrointestinal tract) 717754 ; Clearing film for MRI FB GI (foreign body in gastrointestinal tract) FINDINGS: BOWEL: Nonobstructive bowel gas pattern. No radiodense foreign body. SOFT TISSUES: Surgical clips left upper abdomen and right pelvis as before. BONES: Multilevel lumbar spondylitic change. No acute osseous abnormality. IMPRESSION: 1. No radiodense foreign body. Electronically signed by: Katheleen Faes MD 09/30/2023 08:17 AM EDT RP Workstation: [id number]   DG CHEST PORT 1 VIEW Result Date:  09/29/2023 CLINICAL DATA:  DKA EXAM: PORTABLE CHEST 1 VIEW COMPARISON:  04/13/2017 FINDINGS: The heart size and mediastinal contours are within normal limits. Both lungs are clear. The visualized skeletal structures are unremarkable. IMPRESSION: No active disease. Electronically Signed   By: Ozell Daring M.D.   On: 09/29/2023 16:31   CT Head Wo Contrast Result Date: 09/29/2023 CLINICAL DATA:  Altered level of consciousness EXAM: CT HEAD WITHOUT CONTRAST TECHNIQUE: Contiguous axial images were obtained from the base of the skull through the vertex without intravenous contrast. RADIATION DOSE REDUCTION: This exam was performed according to the departmental dose-optimization program which includes automated exposure control, adjustment of the mA and/or kV according to patient size and/or use of iterative reconstruction technique. COMPARISON:  None Available. FINDINGS: Brain: No acute infarct or hemorrhage. Lateral ventricles and midline structures are unremarkable. No acute extra-axial fluid collections. No mass effect. Vascular: No hyperdense vessel or unexpected calcification. Skull: Normal. Negative for fracture or focal lesion. Sinuses/Orbits: No acute finding. Other: None. IMPRESSION: 1. No acute intracranial process. Electronically Signed   By: Ozell Daring M.D.   On: 09/29/2023 15:22    Microbiology: Results for orders placed or performed in visit on 11/25/15  Urine culture     Status: None   Collection Time: 11/25/15 10:56 AM   Specimen: Urine  Result Value Ref Range Status   Organism ID, Bacteria   Final    Three or more organisms present,each greater than 10,000 CFU/mL.These organisms,commonly found on external and internal genitalia,are considered to be colonizers.No further testing performed.     Labs: CBC: Recent Labs  Lab 10/09/23 0406 10/14/23 0336  WBC 6.8 4.3  HGB 10.4* 9.8*  HCT 33.9* 31.4*  MCV 89.7 91.3  PLT 346 319   Basic Metabolic Panel: Recent Labs  Lab  10/09/23 0406 10/10/23 0355 10/14/23 0336  NA 133* 136 137  K 3.9 3.7 4.1  CL 100 103 104  CO2 24 26 26   GLUCOSE 134* 148* 126*  BUN 13 14 14   CREATININE 0.68 0.63 0.61  CALCIUM 8.1* 8.1* 8.2*  MG 1.6* 1.9  --    Liver Function Tests: No results for input(s): AST, ALT, ALKPHOS, BILITOT, PROT, ALBUMIN  in the last 168 hours. CBG: Recent Labs  Lab 10/13/23 1133 10/13/23 1637 10/13/23 2148 10/14/23 0747 10/14/23 1203  GLUCAP 231* 275* 109* 165* 206*    Discharge time spent: greater than 30 minutes.  Signed: Lebron JINNY Cage, MD [facility] 10/14/2023

## 2023-10-14 NOTE — Plan of Care (Signed)
  Problem: Coping: Goal: Ability to adjust to condition or change in health will improve Outcome: Progressing   Problem: Fluid Volume: Goal: Ability to maintain a balanced intake and output will improve Outcome: Progressing   Problem: Health Behavior/Discharge Planning: Goal: Ability to identify and utilize available resources and services will improve Outcome: Progressing Goal: Ability to manage health-related needs will improve Outcome: Progressing   Problem: Metabolic: Goal: Ability to maintain appropriate glucose levels will improve Outcome: Progressing   Problem: Skin Integrity: Goal: Risk for impaired skin integrity will decrease Outcome: Progressing   Problem: Tissue Perfusion: Goal: Adequacy of tissue perfusion will improve Outcome: Progressing   Problem: Education: Goal: Individualized Educational Video(s) Outcome: Progressing   Problem: Cardiac: Goal: Ability to maintain an adequate cardiac output will improve Outcome: Progressing   Problem: Health Behavior/Discharge Planning: Goal: Ability to identify and utilize available resources and services will improve Outcome: Progressing Goal: Ability to manage health-related needs will improve Outcome: Progressing   Problem: Fluid Volume: Goal: Ability to achieve a balanced intake and output will improve Outcome: Progressing   Problem: Metabolic: Goal: Ability to maintain appropriate glucose levels will improve Outcome: Progressing   Problem: Nutritional: Goal: Maintenance of adequate nutrition will improve Outcome: Progressing Goal: Maintenance of adequate weight for body size and type will improve Outcome: Progressing   Problem: Respiratory: Goal: Will regain and/or maintain adequate ventilation Outcome: Progressing   Problem: Urinary Elimination: Goal: Ability to achieve and maintain adequate renal perfusion and functioning will improve Outcome: Progressing   Problem: Health Behavior/Discharge  Planning: Goal: Ability to manage health-related needs will improve Outcome: Progressing   Problem: Clinical Measurements: Goal: Ability to maintain clinical measurements within normal limits will improve Outcome: Progressing Goal: Will remain free from infection Outcome: Progressing Goal: Diagnostic test results will improve Outcome: Progressing Goal: Respiratory complications will improve Outcome: Progressing Goal: Cardiovascular complication will be avoided Outcome: Progressing   Problem: Activity: Goal: Risk for activity intolerance will decrease Outcome: Progressing   Problem: Nutrition: Goal: Adequate nutrition will be maintained Outcome: Progressing   Problem: Coping: Goal: Level of anxiety will decrease Outcome: Progressing   Problem: Elimination: Goal: Will not experience complications related to bowel motility Outcome: Progressing Goal: Will not experience complications related to urinary retention Outcome: Progressing   Problem: Pain Managment: Goal: General experience of comfort will improve and/or be controlled Outcome: Progressing   Problem: Safety: Goal: Ability to remain free from injury will improve Outcome: Progressing   Problem: Skin Integrity: Goal: Risk for impaired skin integrity will decrease Outcome: Progressing

## 2023-10-17 NOTE — TOC CM/SW Note (Signed)
 CSW provided placement update to patient's APS caseworker, Jon Keel.
